# Patient Record
Sex: Female | Born: 1940
Health system: Southern US, Community
[De-identification: ages and names within clinical notes are randomized; demographics above are authoritative.]

## PROBLEM LIST (undated history)

## (undated) DIAGNOSIS — Z860100 Personal history of colon polyps, unspecified: Secondary | ICD-10-CM

## (undated) DIAGNOSIS — M545 Low back pain, unspecified: Secondary | ICD-10-CM

## (undated) DIAGNOSIS — I872 Venous insufficiency (chronic) (peripheral): Secondary | ICD-10-CM

## (undated) DIAGNOSIS — I341 Nonrheumatic mitral (valve) prolapse: Secondary | ICD-10-CM

## (undated) DIAGNOSIS — E7800 Pure hypercholesterolemia, unspecified: Secondary | ICD-10-CM

## (undated) DIAGNOSIS — F419 Anxiety disorder, unspecified: Secondary | ICD-10-CM

## (undated) DIAGNOSIS — E663 Overweight: Secondary | ICD-10-CM

## (undated) DIAGNOSIS — C189 Malignant neoplasm of colon, unspecified: Secondary | ICD-10-CM

## (undated) DIAGNOSIS — K5730 Diverticulosis of large intestine without perforation or abscess without bleeding: Secondary | ICD-10-CM

## (undated) DIAGNOSIS — I10 Essential (primary) hypertension: Secondary | ICD-10-CM

## (undated) DIAGNOSIS — E039 Hypothyroidism, unspecified: Secondary | ICD-10-CM

## (undated) DIAGNOSIS — Z8719 Personal history of other diseases of the digestive system: Secondary | ICD-10-CM

## (undated) HISTORY — DX: Diverticulosis of large intestine without perforation or abscess without bleeding: K57.30

## (undated) HISTORY — DX: Venous insufficiency (chronic) (peripheral): I87.2

## (undated) HISTORY — DX: Nonrheumatic mitral (valve) prolapse: I34.1

## (undated) HISTORY — DX: Low back pain, unspecified: M54.50

## (undated) HISTORY — DX: Pure hypercholesterolemia, unspecified: E78.00

## (undated) HISTORY — DX: Anxiety disorder, unspecified: F41.9

## (undated) HISTORY — DX: Overweight: E66.3

## (undated) HISTORY — DX: Essential (primary) hypertension: I10

## (undated) HISTORY — DX: Personal history of colon polyps, unspecified: Z86.0100

## (undated) HISTORY — DX: Low back pain: M54.5

## (undated) HISTORY — DX: Personal history of colonic polyps: Z86.010

## (undated) HISTORY — DX: Malignant neoplasm of colon, unspecified: C18.9

## (undated) HISTORY — DX: Personal history of other diseases of the digestive system: Z87.19

---

## 1972-09-18 HISTORY — PX: ABDOMINAL HYSTERECTOMY: SHX81

## 1986-09-18 HISTORY — PX: UMBILICAL HERNIA REPAIR: SHX196

## 2001-03-05 ENCOUNTER — Encounter: Payer: Self-pay | Admitting: Emergency Medicine

## 2001-03-05 ENCOUNTER — Emergency Department (HOSPITAL_COMMUNITY): Admission: EM | Admit: 2001-03-05 | Discharge: 2001-03-05 | Payer: Self-pay | Admitting: Emergency Medicine

## 2001-07-15 ENCOUNTER — Other Ambulatory Visit: Admission: RE | Admit: 2001-07-15 | Discharge: 2001-07-15 | Payer: Self-pay | Admitting: Obstetrics and Gynecology

## 2004-09-18 HISTORY — PX: OTHER SURGICAL HISTORY: SHX169

## 2006-05-25 ENCOUNTER — Ambulatory Visit: Payer: Self-pay | Admitting: Pulmonary Disease

## 2006-06-12 ENCOUNTER — Ambulatory Visit: Payer: Self-pay | Admitting: Pulmonary Disease

## 2006-11-19 ENCOUNTER — Ambulatory Visit: Payer: Self-pay | Admitting: Gastroenterology

## 2006-12-05 ENCOUNTER — Encounter (INDEPENDENT_AMBULATORY_CARE_PROVIDER_SITE_OTHER): Payer: Self-pay | Admitting: *Deleted

## 2006-12-05 ENCOUNTER — Ambulatory Visit: Payer: Self-pay | Admitting: Gastroenterology

## 2007-02-12 ENCOUNTER — Ambulatory Visit: Payer: Self-pay | Admitting: Gastroenterology

## 2007-02-27 ENCOUNTER — Ambulatory Visit: Payer: Self-pay | Admitting: Gastroenterology

## 2007-05-09 ENCOUNTER — Ambulatory Visit: Payer: Self-pay | Admitting: Pulmonary Disease

## 2007-05-09 LAB — CONVERTED CEMR LAB
ALT: 20 units/L (ref 0–35)
AST: 27 units/L (ref 0–37)
Albumin: 3.7 g/dL (ref 3.5–5.2)
Alkaline Phosphatase: 92 units/L (ref 39–117)
BUN: 13 mg/dL (ref 6–23)
Basophils Absolute: 0 10*3/uL (ref 0.0–0.1)
Basophils Relative: 0.5 % (ref 0.0–1.0)
Bilirubin, Direct: 0.1 mg/dL (ref 0.0–0.3)
CO2: 31 meq/L (ref 19–32)
Calcium: 9.3 mg/dL (ref 8.4–10.5)
Chloride: 104 meq/L (ref 96–112)
Cholesterol: 158 mg/dL (ref 0–200)
Creatinine, Ser: 0.9 mg/dL (ref 0.4–1.2)
Eosinophils Absolute: 0.3 10*3/uL (ref 0.0–0.6)
Eosinophils Relative: 4.6 % (ref 0.0–5.0)
GFR calc Af Amer: 81 mL/min
GFR calc non Af Amer: 67 mL/min
Glucose, Bld: 109 mg/dL — ABNORMAL HIGH (ref 70–99)
HCT: 37.1 % (ref 36.0–46.0)
HDL: 43 mg/dL (ref >39.0)
Hemoglobin: 12.6 g/dL (ref 12.0–15.0)
LDL Cholesterol: 102 mg/dL — ABNORMAL HIGH (ref 0–99)
Lymphocytes Relative: 24.8 % (ref 12.0–46.0)
MCHC: 33.9 g/dL (ref 30.0–36.0)
MCV: 86.5 fL (ref 78.0–100.0)
Monocytes Absolute: 0.6 10*3/uL (ref 0.2–0.7)
Monocytes Relative: 11.3 % — ABNORMAL HIGH (ref 3.0–11.0)
Neutro Abs: 3.2 10*3/uL (ref 1.4–7.7)
Neutrophils Relative %: 58.8 % (ref 43.0–77.0)
Platelets: 264 10*3/uL (ref 150–400)
Potassium: 3.5 meq/L (ref 3.5–5.1)
RBC: 4.28 M/uL (ref 3.87–5.11)
RDW: 13.3 % (ref 11.5–14.6)
Sodium: 141 meq/L (ref 135–145)
TSH: 2.97 microintl units/mL (ref 0.35–5.50)
Total Bilirubin: 1 mg/dL (ref 0.3–1.2)
Total CHOL/HDL Ratio: 3.7
Total Protein: 7.4 g/dL (ref 6.0–8.3)
Triglycerides: 64 mg/dL (ref 0–149)
VLDL: 13 mg/dL (ref 0–40)
WBC: 5.5 10*3/uL (ref 4.5–10.5)

## 2008-02-24 ENCOUNTER — Encounter: Payer: Self-pay | Admitting: Pulmonary Disease

## 2008-03-12 ENCOUNTER — Encounter: Payer: Self-pay | Admitting: Pulmonary Disease

## 2008-06-03 ENCOUNTER — Ambulatory Visit: Payer: Self-pay | Admitting: Pulmonary Disease

## 2008-06-03 DIAGNOSIS — D126 Benign neoplasm of colon, unspecified: Secondary | ICD-10-CM

## 2008-06-03 DIAGNOSIS — I10 Essential (primary) hypertension: Secondary | ICD-10-CM

## 2008-06-03 DIAGNOSIS — E7800 Pure hypercholesterolemia, unspecified: Secondary | ICD-10-CM

## 2008-06-03 DIAGNOSIS — E663 Overweight: Secondary | ICD-10-CM | POA: Insufficient documentation

## 2008-06-03 DIAGNOSIS — I119 Hypertensive heart disease without heart failure: Secondary | ICD-10-CM | POA: Insufficient documentation

## 2008-06-03 DIAGNOSIS — J309 Allergic rhinitis, unspecified: Secondary | ICD-10-CM | POA: Insufficient documentation

## 2008-06-03 DIAGNOSIS — I059 Rheumatic mitral valve disease, unspecified: Secondary | ICD-10-CM | POA: Insufficient documentation

## 2008-06-03 DIAGNOSIS — K649 Unspecified hemorrhoids: Secondary | ICD-10-CM | POA: Insufficient documentation

## 2008-06-03 DIAGNOSIS — F411 Generalized anxiety disorder: Secondary | ICD-10-CM | POA: Insufficient documentation

## 2008-06-07 DIAGNOSIS — C189 Malignant neoplasm of colon, unspecified: Secondary | ICD-10-CM | POA: Insufficient documentation

## 2008-06-07 DIAGNOSIS — K5730 Diverticulosis of large intestine without perforation or abscess without bleeding: Secondary | ICD-10-CM | POA: Insufficient documentation

## 2008-06-07 LAB — CONVERTED CEMR LAB
ALT: 27 units/L (ref 0–35)
AST: 29 units/L (ref 0–37)
Albumin: 4.1 g/dL (ref 3.5–5.2)
Alkaline Phosphatase: 76 units/L (ref 39–117)
BUN: 16 mg/dL (ref 6–23)
Basophils Absolute: 0.1 10*3/uL (ref 0.0–0.1)
Basophils Relative: 1.1 % (ref 0.0–3.0)
Bilirubin Urine: NEGATIVE
Bilirubin, Direct: 0.2 mg/dL (ref 0.0–0.3)
CO2: 30 meq/L (ref 19–32)
Calcium: 9.4 mg/dL (ref 8.4–10.5)
Chloride: 112 meq/L (ref 96–112)
Cholesterol: 151 mg/dL (ref 0–200)
Creatinine, Ser: 1 mg/dL (ref 0.4–1.2)
Crystals: NEGATIVE
Eosinophils Absolute: 0.2 10*3/uL (ref 0.0–0.7)
Eosinophils Relative: 3 % (ref 0.0–5.0)
GFR calc Af Amer: 71 mL/min
GFR calc non Af Amer: 59 mL/min
Glucose, Bld: 103 mg/dL — ABNORMAL HIGH (ref 70–99)
HCT: 39.1 % (ref 36.0–46.0)
HDL: 62.5 mg/dL (ref >39.0)
Hemoglobin, Urine: NEGATIVE
Hemoglobin: 13.1 g/dL (ref 12.0–15.0)
Ketones, ur: NEGATIVE mg/dL
LDL Cholesterol: 71 mg/dL (ref 0–99)
Lymphocytes Relative: 15.9 % (ref 12.0–46.0)
MCHC: 33.4 g/dL (ref 30.0–36.0)
MCV: 87.8 fL (ref 78.0–100.0)
Monocytes Absolute: 0.4 10*3/uL (ref 0.1–1.0)
Monocytes Relative: 5.3 % (ref 3.0–12.0)
Mucus, UA: NEGATIVE
Neutro Abs: 5.3 10*3/uL (ref 1.4–7.7)
Neutrophils Relative %: 72.5 % (ref 43.0–77.0)
Nitrite: NEGATIVE
Platelets: 245 10*3/uL (ref 150–400)
Potassium: 3.7 meq/L (ref 3.5–5.1)
RBC / HPF: NONE SEEN
RBC: 4.46 M/uL (ref 3.87–5.11)
RDW: 13.2 % (ref 11.5–14.6)
Sodium: 144 meq/L (ref 135–145)
Specific Gravity, Urine: 1.015 (ref 1.000–1.03)
TSH: 3.17 microintl units/mL (ref 0.35–5.50)
Total Bilirubin: 0.9 mg/dL (ref 0.3–1.2)
Total CHOL/HDL Ratio: 2.4
Total Protein, Urine: NEGATIVE mg/dL
Total Protein: 7.9 g/dL (ref 6.0–8.3)
Triglycerides: 88 mg/dL (ref 0–149)
Urine Glucose: NEGATIVE mg/dL
Urobilinogen, UA: 0.2 (ref 0.0–1.0)
VLDL: 18 mg/dL (ref 0–40)
Vit D, 1,25-Dihydroxy: 22 — ABNORMAL LOW (ref 30–89)
WBC: 7.3 10*3/uL (ref 4.5–10.5)
pH: 6 (ref 5.0–8.0)

## 2008-06-09 ENCOUNTER — Telehealth (INDEPENDENT_AMBULATORY_CARE_PROVIDER_SITE_OTHER): Payer: Self-pay | Admitting: *Deleted

## 2008-06-10 ENCOUNTER — Telehealth: Payer: Self-pay | Admitting: Pulmonary Disease

## 2009-01-13 ENCOUNTER — Encounter (INDEPENDENT_AMBULATORY_CARE_PROVIDER_SITE_OTHER): Payer: Self-pay | Admitting: *Deleted

## 2009-02-10 ENCOUNTER — Ambulatory Visit: Payer: Self-pay | Admitting: Gastroenterology

## 2009-02-24 ENCOUNTER — Ambulatory Visit: Payer: Self-pay | Admitting: Gastroenterology

## 2009-03-08 ENCOUNTER — Encounter: Payer: Self-pay | Admitting: Pulmonary Disease

## 2009-06-09 ENCOUNTER — Ambulatory Visit: Payer: Self-pay | Admitting: Pulmonary Disease

## 2009-06-09 DIAGNOSIS — E559 Vitamin D deficiency, unspecified: Secondary | ICD-10-CM

## 2009-06-12 DIAGNOSIS — I872 Venous insufficiency (chronic) (peripheral): Secondary | ICD-10-CM | POA: Insufficient documentation

## 2009-06-12 LAB — CONVERTED CEMR LAB
ALT: 19 units/L (ref 0–35)
AST: 25 units/L (ref 0–37)
Albumin: 3.8 g/dL (ref 3.5–5.2)
Alkaline Phosphatase: 77 units/L (ref 39–117)
BUN: 13 mg/dL (ref 6–23)
Basophils Absolute: 0 10*3/uL (ref 0.0–0.1)
Basophils Relative: 0.1 % (ref 0.0–3.0)
Bilirubin, Direct: 0.1 mg/dL (ref 0.0–0.3)
CO2: 30 meq/L (ref 19–32)
Calcium: 9.4 mg/dL (ref 8.4–10.5)
Chloride: 109 meq/L (ref 96–112)
Cholesterol: 149 mg/dL (ref 0–200)
Creatinine, Ser: 1 mg/dL (ref 0.4–1.2)
Eosinophils Absolute: 0.2 10*3/uL (ref 0.0–0.7)
Eosinophils Relative: 3.8 % (ref 0.0–5.0)
GFR calc non Af Amer: 71.01 mL/min (ref >60)
Glucose, Bld: 95 mg/dL (ref 70–99)
HCT: 38.3 % (ref 36.0–46.0)
HDL: 53.9 mg/dL (ref >39.00)
Hemoglobin: 12.6 g/dL (ref 12.0–15.0)
LDL Cholesterol: 82 mg/dL (ref 0–99)
Lymphocytes Relative: 17.9 % (ref 12.0–46.0)
Lymphs Abs: 1 10*3/uL (ref 0.7–4.0)
MCHC: 33 g/dL (ref 30.0–36.0)
MCV: 87.7 fL (ref 78.0–100.0)
Monocytes Absolute: 0.7 10*3/uL (ref 0.1–1.0)
Monocytes Relative: 13.1 % — ABNORMAL HIGH (ref 3.0–12.0)
Neutro Abs: 3.8 10*3/uL (ref 1.4–7.7)
Neutrophils Relative %: 65.1 % (ref 43.0–77.0)
Platelets: 230 10*3/uL (ref 150.0–400.0)
Potassium: 3.7 meq/L (ref 3.5–5.1)
RBC: 4.37 M/uL (ref 3.87–5.11)
RDW: 13.6 % (ref 11.5–14.6)
Sodium: 144 meq/L (ref 135–145)
TSH: 4.17 microintl units/mL (ref 0.35–5.50)
Total Bilirubin: 0.9 mg/dL (ref 0.3–1.2)
Total CHOL/HDL Ratio: 3
Total Protein: 7.6 g/dL (ref 6.0–8.3)
Triglycerides: 68 mg/dL (ref 0.0–149.0)
VLDL: 13.6 mg/dL (ref 0.0–40.0)
Vit D, 25-Hydroxy: 29 ng/mL — ABNORMAL LOW (ref 30–89)
WBC: 5.7 10*3/uL (ref 4.5–10.5)

## 2009-07-28 ENCOUNTER — Telehealth (INDEPENDENT_AMBULATORY_CARE_PROVIDER_SITE_OTHER): Payer: Self-pay | Admitting: *Deleted

## 2010-03-30 ENCOUNTER — Encounter: Payer: Self-pay | Admitting: Pulmonary Disease

## 2010-04-18 ENCOUNTER — Encounter: Payer: Self-pay | Admitting: Pulmonary Disease

## 2010-06-15 ENCOUNTER — Ambulatory Visit: Payer: Self-pay | Admitting: Pulmonary Disease

## 2010-06-19 DIAGNOSIS — M545 Low back pain: Secondary | ICD-10-CM

## 2010-06-19 LAB — CONVERTED CEMR LAB
ALT: 27 units/L (ref 0–35)
AST: 33 units/L (ref 0–37)
Albumin: 4 g/dL (ref 3.5–5.2)
Alkaline Phosphatase: 76 units/L (ref 39–117)
BUN: 15 mg/dL (ref 6–23)
Basophils Absolute: 0 10*3/uL (ref 0.0–0.1)
Basophils Relative: 0.5 % (ref 0.0–3.0)
Bilirubin, Direct: 0.1 mg/dL (ref 0.0–0.3)
CEA: 1.1 ng/mL (ref 0.0–5.0)
CO2: 30 meq/L (ref 19–32)
Calcium: 9.5 mg/dL (ref 8.4–10.5)
Chloride: 106 meq/L (ref 96–112)
Cholesterol: 157 mg/dL (ref 0–200)
Creatinine, Ser: 1.1 mg/dL (ref 0.4–1.2)
Eosinophils Absolute: 0.2 10*3/uL (ref 0.0–0.7)
Eosinophils Relative: 4.3 % (ref 0.0–5.0)
GFR calc non Af Amer: 66.19 mL/min (ref >60)
Glucose, Bld: 89 mg/dL (ref 70–99)
HCT: 37.9 % (ref 36.0–46.0)
HDL: 56.4 mg/dL (ref >39.00)
Hemoglobin: 12.9 g/dL (ref 12.0–15.0)
LDL Cholesterol: 90 mg/dL (ref 0–99)
Lymphocytes Relative: 18.6 % (ref 12.0–46.0)
Lymphs Abs: 1 10*3/uL (ref 0.7–4.0)
MCHC: 33.9 g/dL (ref 30.0–36.0)
MCV: 88.5 fL (ref 78.0–100.0)
Monocytes Absolute: 0.7 10*3/uL (ref 0.1–1.0)
Monocytes Relative: 14 % — ABNORMAL HIGH (ref 3.0–12.0)
Neutro Abs: 3.4 10*3/uL (ref 1.4–7.7)
Neutrophils Relative %: 62.6 % (ref 43.0–77.0)
Platelets: 223 10*3/uL (ref 150.0–400.0)
Potassium: 3.8 meq/L (ref 3.5–5.1)
RBC: 4.29 M/uL (ref 3.87–5.11)
RDW: 13.8 % (ref 11.5–14.6)
Sodium: 142 meq/L (ref 135–145)
TSH: 3.23 microintl units/mL (ref 0.35–5.50)
Total Bilirubin: 0.6 mg/dL (ref 0.3–1.2)
Total CHOL/HDL Ratio: 3
Total Protein: 7.3 g/dL (ref 6.0–8.3)
Triglycerides: 55 mg/dL (ref 0.0–149.0)
VLDL: 11 mg/dL (ref 0.0–40.0)
WBC: 5.4 10*3/uL (ref 4.5–10.5)

## 2010-06-29 ENCOUNTER — Encounter: Payer: Self-pay | Admitting: Pulmonary Disease

## 2010-06-29 ENCOUNTER — Ambulatory Visit: Payer: Self-pay | Admitting: Internal Medicine

## 2010-10-18 NOTE — Miscellaneous (Signed)
Summary: BONE DENSITY  Clinical Lists Changes  Orders: Added new Test order of T-Bone Densitometry (77080) - Signed Added new Test order of T-Lumbar Vertebral Assessment (77082) - Signed 

## 2010-10-18 NOTE — Assessment & Plan Note (Signed)
Summary: CPX/ MBW   CC:  Yearly ROV & review of mult medical problems....  History of Present Illness: 70 y/o BF here for a follow up visit... she has multiple medical problems as noted below...    ~  June 09, 2009:  she has had a good year- no new complaints or concerns... had Colonoscopy 6/10 by DrPatterson- no polyps or sign of cancer recurrence & f/u planned 77yrs.... had neg  Mammogram 6/10 as well...    ~  June 15, 2010:  Yearly ROV- doing satis, notes swelling in right supraclav area> exam reveals cyst vs lipoma & rec observ vs excision if she wants... BP sl elevated & we discussed adding Metoprolol; denies CP, palpit, SOB, edema;  stable on Cres20;  continues to have difficulty w/ wt reduction... apparently her [name], hasn't done BMD & we will proceed here...  she declines Flu shot.   Current Problem List:  ALLERGIC RHINITIS (ICD-477.9) - ex-smoker quit 1998... prev on allergy shots per DrESL and uses OTC antihistamines as needed... also states that BC's help her sinuses...  HYPERTENSION (ICD-401.9) - on MAXZIDE 1/2 daily + K20/d... BP= 132/90, takes med regularly and tolerates well... denies HA, fatigue, visual changes, CP, palipit, dizziness, syncope, dyspnea, edema, etc...  ~  9/11:  we decided to add METOPROLOL XL 50mg /d & she will renew efforts to lose wt.  MITRAL VALVE PROLAPSE (ICD-424.0) - on ASA 81mg /d... asymptomatic without CP, palpit, etc...  VENOUS INSUFFICIENCY (ICD-459.81) - she knows to avoid sodium, elevate legs, & wear support hose.  HYPERCHOLESTEROLEMIA (ICD-272.0) - on CRESTOR 20mg /d...   ~  FLP 8/08 showed TChol 158, TG 64, HDL 43, LDL 102  ~  FLP 9/09 showed TChol 151, TG 88, HDL 63, LDL 71  ~  FLP 9/10 showed TChol 149, TG 68, HDL 54, LDL 82  ~  FLP 9/11 showed TChol 157, TG 55, HDL 56, LDL 90  OVERWEIGHT (ICD-278.02) - discussed diet + exercise program designed to help pt lose wt...  ~  weight 9/09 = 208#  ~  weight 9/10 = 212#  ~   weight 9/11 = 207#  COLON CANCER (ICD-153.9) & COLONIC POLYPS (ICD-211.3) - prev colon 3/08 showed 25mm polyp, divertics, and hems... path= adenocarcinoma in a tubular adenoma, margins neg and being followed very carefully by DrPatterson...  ~  colonoscopy 6/08 by DrPatterson was normal...   ~  colonoscopy 6/10 by DrPatteron was neg- no polyp or lesions & f/u planned 66yrs.  DIVERTICULOSIS OF COLON (ICD-562.10) Hx of HEMORRHOIDS (ICD-455.6)  BACK PAIN, LUMBAR (ICD-724.2) - she blames this on her pendulous breasts & she has not pursued plastic surg approach... uses OTC Tylenol etc for discomfort...  VITAMIN D DEFICIENCY (ICD-268.9) - on Vit D 1000 u daily...  ~  labs 9/09 showed Vit D level= 22... started on Vit D 50000 u weekly but she stopped on her own.  ~  labs 9/10 showed Vit D level= 29... rec> Vit D 2000 u daily.  ANXIETY (ICD-300.00) - prev on Chlorazepate, but prefers AMBIEN 10mg - 1/2 Qhs Prn to help her rest...  DERM - she has mild Tinea Corporis (uses Lotrimin AF cream Prn) & Lipoma in right supraclav fossa...  Health Maintenance - GYN (DrKShelton) for PAPs, Mammograms, but no BMD yet... rec Calcium, MVI, VitD... she refuses Tetanus shot... given PNEUMOVAX 9/10- age 21, and she has declined the seasonal Flu vaccines...   Preventive Screening-Counseling & Management  Alcohol-Tobacco     Smoking Status:  quit     Packs/Day: .5     Year Quit: 1998  Allergies: 1)  ! Penicillin  Comments:  Nurse/Medical Assistant: The patient's medications and allergies were reviewed with the patient and were updated in the Medication and Allergy Lists.  Past History:  Past Medical History: ALLERGIC RHINITIS (ICD-477.9) HYPERTENSION (ICD-401.9) MITRAL VALVE PROLAPSE (ICD-424.0) VENOUS INSUFFICIENCY (ICD-459.81) HYPERCHOLESTEROLEMIA (ICD-272.0) OVERWEIGHT (ICD-278.02) COLON CANCER (ICD-153.9) COLONIC POLYPS (ICD-211.3) DIVERTICULOSIS OF COLON (ICD-562.10) Hx of HEMORRHOIDS  (ICD-455.6) BACK PAIN, LUMBAR (ICD-724.2) VITAMIN D DEFICIENCY (ICD-268.9) ANXIETY (ICD-300.00)  Past Surgical History: S/P hysterectomy in 1974 S/P umbilical hernia repair in 1988 S/P benign breast biopsy 2006  Family History: Reviewed history from 06/09/2009 and no changes required. mother deceased age 66 father is unsure of information 1 sibing alive age 21 1 sibling alive age 24  Social History: Reviewed history from 06/09/2009 and no changes required. ex smoker---quit in 1998 exposed to second hand smoke exercises 3 times per week no caffeine use widowed 2 children Packs/Day:  .5  Review of Systems       The patient complains of dyspnea on exertion.  The patient denies fever, chills, sweats, anorexia, fatigue, weakness, malaise, weight loss, sleep disorder, blurring, diplopia, eye irritation, eye discharge, vision loss, eye pain, photophobia, earache, ear discharge, tinnitus, decreased hearing, nasal congestion, nosebleeds, sore throat, hoarseness, chest pain, palpitations, syncope, orthopnea, PND, peripheral edema, cough, dyspnea at rest, excessive sputum, hemoptysis, wheezing, pleurisy, nausea, vomiting, diarrhea, constipation, change in bowel habits, abdominal pain, melena, hematochezia, jaundice, gas/bloating, indigestion/heartburn, dysphagia, odynophagia, dysuria, hematuria, urinary frequency, urinary hesitancy, nocturia, incontinence, back pain, joint pain, joint swelling, muscle cramps, muscle weakness, stiffness, arthritis, sciatica, restless legs, leg pain at night, leg pain with exertion, rash, itching, dryness, suspicious lesions, paralysis, paresthesias, seizures, tremors, vertigo, transient blindness, frequent falls, frequent headaches, difficulty walking, depression, anxiety, memory loss, confusion, cold intolerance, heat intolerance, polydipsia, polyphagia, polyuria, unusual weight change, abnormal bruising, bleeding, enlarged lymph nodes, urticaria, allergic rash,  hay fever, and recurrent infections.    Vital Signs:  Patient profile:   70 year old female Height:      67 inches Weight:      207.13 pounds BMI:     32.56 O2 Sat:      96 % on Room air Temp:     99.9 degrees F oral Pulse rate:   88 / minute BP sitting:   132 / 90  (right arm) Cuff size:   regular  Vitals Entered By: Randell Loop CMA (June 15, 2010 10:04 AM)  O2 Sat at Rest %:  96 O2 Flow:  Room air CC: Yearly ROV & review of mult medical problems... Is Patient Diabetic? No Pain Assessment Patient in pain? no      Comments meds updated today with pt   Physical Exam  Additional Exam:  WD, WN, 70 y/o WF in NAD... GENERAL:  Alert & oriented; pleasant & cooperative... HEENT:  [place]/AT, EOM-wnl, PERRLA, Fundi-benign, EACs-clear, TMs-wnl, NOSE-clear, THROAT-clear & wnl. NECK:  Supple w/ fairROM; no JVD; normal carotid impulses w/o bruits; no thyromegaly or nodules palpated; no lymphadenopathy. Lipoma vs cyst in right supraclavicular area... CHEST:  Clear to P & A; without wheezes/ rales/ or rhonchi heard... HEART:  Regular Rhythm; without murmurs/ rubs/ or gallops detected... ABDOMEN:  Soft & nontender; normal bowel sounds; no organomegaly or masses palpated... EXT: without deformities or arthritic changes; no varicose veins/ venous insuffic/ or edema. NEURO:  CN's intact; motor testing normal; sensory testing normal; gait normal &  balance OK. DERM:  No lesions noted; no rash etc...    CXR  Procedure date:  06/15/2010  Findings:      CHEST - 2 VIEW Comparison: The 05/25/2006   Findings: Heart is upper limits normal in size.  Lungs are clear. No effusions or acute bony abnormality.   IMPRESSION: No active disease.   Read By:  Charlett Nose,  M.D.   MISC. Report  Procedure date:  06/15/2010  Findings:      BMP (METABOL)   Sodium                    142 mEq/L                   135-145   Potassium                 3.8 mEq/L                   3.5-5.1    Chloride                  106 mEq/L                   96-112   Carbon Dioxide            30 mEq/L                    19-32   Glucose                   89 mg/dL                    37-62   BUN                       15 mg/dL                    8-31   Creatinine                1.1 mg/dL                   5.1-7.6   Calcium                   9.5 mg/dL                   1.6-07.3   GFR                       66.19 mL/min                >60  Hepatic/Liver Function Panel (HEPATIC)   Total Bilirubin           0.6 mg/dL                   7.1-0.6   Direct Bilirubin          0.1 mg/dL                   2.6-9.4   Alkaline Phosphatase      76 U/L                      39-117   AST                       33 U/L  0-37   ALT                       27 U/L                      0-35   Total Protein             7.3 g/dL                    1.6-1.0   Albumin                   4.0 g/dL                    9.6-0.4  CBC Platelet w/Diff (CBCD)   White Cell Count          5.4 K/uL                    4.5-10.5   Red Cell Count            4.29 Mil/uL                 3.87-5.11   Hemoglobin                12.9 g/dL                   54.0-98.1   Hematocrit                37.9 %                      36.0-46.0   MCV                       88.5 fl                     78.0-100.0   Platelet Count            223.0 K/uL                  150.0-400.0   Neutrophil %              62.6 %                      43.0-77.0   Lymphocyte %              18.6 %                      12.0-46.0   Monocyte %           [H]  14.0 %                      3.0-12.0   Eosinophils%              4.3 %                       0.0-5.0   Basophils %               0.5 %                       0.0-3.0  Comments:      Lipid Panel (LIPID)   Cholesterol  157 mg/dL                   4-010   Triglycerides             55.0 mg/dL                  2.7-253.6   HDL                       64.40 mg/dL                 >34.74   LDL Cholesterol            90 mg/dL                    2-59           TSH (TSH)   FastTSH                   3.23 uIU/mL                 0.35-5.50  CEA (CEA)   CEA                       1.1 ng/mL                   0.0-5.0   Impression & Recommendations:  Problem # 1:  HYPERTENSION (ICD-401.9) We decided to add Metoprolol to the Maxzide... monitor BP at hme and call if not responding. Her updated medication list for this problem includes:    Metoprolol Succinate 50 Mg Xr24h-tab (Metoprolol succinate) .Marland Kitchen... Take 1 tab by mouth once daily...    Maxzide 75-50 Mg Tabs (Triamterene-hctz) .Marland Kitchen... Take 1/2 tablet by mouth daily  Orders: T-2 View CXR (71020TC) TLB-BMP (Basic Metabolic Panel-BMET) (80048-METABOL) TLB-Hepatic/Liver Function Pnl (80076-HEPATIC) TLB-CBC Platelet - w/Differential (85025-CBCD) TLB-Lipid Panel (80061-LIPID) TLB-TSH (Thyroid Stimulating Hormone) (84443-TSH)  Problem # 2:  HYPERCHOLESTEROLEMIA (ICD-272.0) Stable on the Crestor 20mg /d... Her updated medication list for this problem includes:    Crestor 20 Mg Tabs (Rosuvastatin calcium) .Marland Kitchen... Take 1 tab by mouth at bedtime...  Problem # 3:  OVERWEIGHT (ICD-278.02) We discussed diet + exercise & the need to decr weight...  Problem # 4:  COLON CANCER (ICD-153.9) Stable w/ last colon 6/10 & f/u planned 5 yrs...  Problem # 5:  VITAMIN D DEFICIENCY (ICD-268.9) She will ret for baseline BMD since it hasn't been done by her GYN... continue Calcium, MVI, Vit D...  Problem # 6:  OTHER MEDICAL PROBLEMS AS NOTED>>>  Complete Medication List: 1)  Adult Aspirin Ec Low Strength 81 Mg Tbec (Aspirin) .... Once daily 2)  Metoprolol Succinate 50 Mg Xr24h-tab (Metoprolol succinate) .... Take 1 tab by mouth once daily.Marland KitchenMarland Kitchen 3)  Maxzide 75-50 Mg Tabs (Triamterene-hctz) .... Take 1/2 tablet by mouth daily 4)  Klor-con M20 20 Meq Tbcr (Potassium chloride crys cr) .... Take 1 tablet by mouth once a day 5)  Crestor 20 Mg Tabs (Rosuvastatin calcium) ....  Take 1 tab by mouth at bedtime.Marland KitchenMarland Kitchen 6)  Ambien 10 Mg Tabs (Zolpidem tartrate) .... Take1/2 to 1 tab by mouth at bedtime as needed for sleep.Marland KitchenMarland Kitchen 7)  Womens Multivitamin Plus Tabs (Multiple vitamins-minerals) .... Take 1 tab by mouth once daily.Marland KitchenMarland Kitchen 8)  Vitamin D 2000 Unit Tabs (Cholecalciferol) .... Take 1 tablet by mouth once a day 9)  Lotrimin Af 1 % Crea (Clotrimazole) .... Apply to rash two  times a day...  Other Orders: TLB-CEA (Carcinoembryonic Antigen) (82378-CEA)  Patient Instructions: 1)  Today we updated your med list- see below.... 2)  We wrote a new perscription for METOPROLOL to take in addition to your Maxzide for your BP & heart... 3)  Today we did your follow up CXR & FASTING blood work...  please call the "phone tree" in a few days for your lab results.Marland KitchenMarland Kitchen 4)  Keep up the good work w/ diet + exercise... the goal is to lose 10-15 lbs over the next 6 months... 5)  Call for any questions.Marland KitchenMarland Kitchen 6)  Please schedule a follow-up appointment in 6 months. Prescriptions: LOTRIMIN AF 1 % CREA (CLOTRIMAZOLE) apply to rash two times a day...  #1 tube x 12   Entered and Authorized by:   Michele Mcalpine MD   Signed by:   Michele Mcalpine MD on 06/15/2010   Method used:   Print then Give to Patient   RxID:   [id number] AMBIEN 10 MG TABS (ZOLPIDEM TARTRATE) take1/2 to 1 tab by mouth at bedtime as needed for sleep...  #30 x 12   Entered and Authorized by:   Michele Mcalpine MD   Signed by:   Michele Mcalpine MD on 06/15/2010   Method used:   Print then Give to Patient   RxID:   [id number] CRESTOR 20 MG  TABS (ROSUVASTATIN CALCIUM) take 1 tab by mouth at bedtime...  #30 x 12   Entered and Authorized by:   Michele Mcalpine MD   Signed by:   Michele Mcalpine MD on 06/15/2010   Method used:   Print then Give to Patient   RxID:   [id number] KLOR-CON M20 20 MEQ  TBCR (POTASSIUM CHLORIDE CRYS CR) Take 1 tablet by mouth once a day  #30 x 12   Entered and Authorized by:   Michele Mcalpine MD   Signed by:    Michele Mcalpine MD on 06/15/2010   Method used:   Print then Give to Patient   RxID:   [id number] MAXZIDE 75-50 MG TABS (TRIAMTERENE-HCTZ) take 1/2 tablet by mouth daily  #30 x 12   Entered and Authorized by:   Michele Mcalpine MD   Signed by:   Michele Mcalpine MD on 06/15/2010   Method used:   Print then Give to Patient   RxID:   [id number] METOPROLOL SUCCINATE 50 MG XR24H-TAB (METOPROLOL SUCCINATE) take 1 tab by mouth once daily...  #30 x 12   Entered and Authorized by:   Michele Mcalpine MD   Signed by:   Michele Mcalpine MD on 06/15/2010   Method used:   Print then Give to Patient   RxID:   [id number]    Immunization History:  Influenza Immunization History:    Influenza:  historical (07/08/2009)

## 2010-11-23 ENCOUNTER — Encounter: Payer: Self-pay | Admitting: Adult Health

## 2010-11-23 ENCOUNTER — Telehealth: Payer: Self-pay | Admitting: Pulmonary Disease

## 2010-11-23 ENCOUNTER — Ambulatory Visit (INDEPENDENT_AMBULATORY_CARE_PROVIDER_SITE_OTHER): Payer: Medicare Other | Admitting: Adult Health

## 2010-11-23 DIAGNOSIS — I10 Essential (primary) hypertension: Secondary | ICD-10-CM

## 2010-11-29 NOTE — Assessment & Plan Note (Signed)
Summary: OV TO CHECK BP//SH   Visit Type:  Acute NP BP visit Primary Provider/Referring Provider:  Alroy Dust, MD  CC:  Pt c/o BP readings 147/81, 125/75, 123/71 this AM with the [facility] automatic machine. Pt wants to know what machine is the best for her.  C/o headaches Friday BP-150/106, and Sunday-150/100.  History of Present Illness: 70 yo female with known hx of HTN and Hyperlipidemia  11/23/10--Presents for work in visit for elevated blood pressure. 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 Pt c/o BP readings 147/81, 125/75,  123/71 this AM with the [facility] automatic machine. Pt wants to know what machine is the best for her.  C/o headaches Friday BP-150/106, Sunday-150/100 on [facility]. We checked blood pressue today and she is  ~118. systolic. She admits she has been eating very salty foods and stopped last few days with better readings.  Headache resolved with drinking water and eating better. No visual speech changes. no extremity weakness. Denies chest pain, dyspnea, orthopnea, hemoptysis, fever, n/v/d, edema, headache. We discussed getting automatic b/p cuffs-large cuff for her.     Preventive Screening-Counseling & Management  Alcohol-Tobacco     Smoking Status: quit     Packs/Day: .5     Year Started: 1962     Year Quit: 1998  Medications Prior to Update: 1)  Adult Aspirin Ec Low Strength 81 Mg  Tbec (Aspirin) .... Once Daily 2)  Metoprolol Succinate 50 Mg Xr24h-Tab (Metoprolol Succinate) .... Take 1 Tab By Mouth Once Daily.Marland KitchenMarland Kitchen 3)  Maxzide 75-50 Mg Tabs (Triamterene-Hctz) .... Take 1/2 Tablet By Mouth Daily 4)  Klor-Con M20 20 Meq  Tbcr (Potassium Chloride Crys Cr) .... Take 1 Tablet By Mouth Once A Day 5)  Crestor 20 Mg  Tabs (Rosuvastatin Calcium) .... Take 1 Tab By Mouth At Bedtime.Marland KitchenMarland Kitchen 6)  Ambien 10 Mg Tabs (Zolpidem Tartrate) .... Take1/2 To 1 Tab By Mouth At Bedtime As Needed For Sleep.Marland KitchenMarland Kitchen 7)  Womens Multivitamin Plus  Tabs (Multiple Vitamins-Minerals) .... Take 1 Tab By  Mouth Once Daily.Marland KitchenMarland Kitchen 8)  Vitamin D 2000 Unit Tabs (Cholecalciferol) .... Take 1 Tablet By Mouth Once A Day 9)  Lotrimin Af 1 % Crea (Clotrimazole) .... Apply To Rash Two Times A Day...  Current Medications (verified): 1)  Adult Aspirin Ec Low Strength 81 Mg  Tbec (Aspirin) .... Once Daily 2)  Metoprolol Succinate 50 Mg Xr24h-Tab (Metoprolol Succinate) .... Take 1 Tab By Mouth Once Daily.Marland KitchenMarland Kitchen 3)  Maxzide 75-50 Mg Tabs (Triamterene-Hctz) .... Take 1/2 Tablet By Mouth Daily 4)  Klor-Con M20 20 Meq  Tbcr (Potassium Chloride Crys Cr) .... Take 1 Tablet By Mouth Once A Day 5)  Crestor 20 Mg  Tabs (Rosuvastatin Calcium) .... Take 1 Tab By Mouth At Bedtime.Marland KitchenMarland Kitchen 6)  Ambien 10 Mg Tabs (Zolpidem Tartrate) .... Take1/2 To 1 Tab By Mouth At Bedtime As Needed For Sleep.Marland KitchenMarland Kitchen 7)  Womens Multivitamin Plus  Tabs (Multiple Vitamins-Minerals) .... Take 1 Tab By Mouth Once Daily.Marland KitchenMarland Kitchen 8)  Vitamin D 2000 Unit Tabs (Cholecalciferol) .... Take 1 Tablet By Mouth Once A Day 9)  Bc Fast Pain Relief 845-65 Mg Pack (Aspirin-Caffeine) .... As Needed 10)  Allergy Shot .... As Directed  Allergies (verified): 1)  ! Penicillin  Past History:  Past Medical History: Last updated: 06/15/2010 ALLERGIC RHINITIS (ICD-477.9) HYPERTENSION (ICD-401.9) MITRAL VALVE PROLAPSE (ICD-424.0) VENOUS INSUFFICIENCY (ICD-459.81) HYPERCHOLESTEROLEMIA (ICD-272.0) OVERWEIGHT (ICD-278.02) COLON CANCER (ICD-153.9) COLONIC POLYPS (ICD-211.3) DIVERTICULOSIS OF COLON (ICD-562.10) Hx of HEMORRHOIDS (ICD-455.6) BACK PAIN, LUMBAR (ICD-724.2) VITAMIN  D DEFICIENCY (ICD-268.9) ANXIETY (ICD-300.00)  Past Surgical History: Last updated: Jun 16, 2010 S/P hysterectomy in 1974 S/P umbilical hernia repair in 1988 S/P benign breast biopsy 2006  Family History: Last updated: 06-16-10 mother deceased age 65 father is unsure of information 1 sibing alive age 69 1 sibling alive age 34  Social History: Last updated: 06/09/2009 ex smoker---quit in  1998 exposed to second hand smoke exercises 3 times per week no caffeine use widowed 2 children  Risk Factors: Smoking Status: quit (11/23/2010) Packs/Day: .5 (11/23/2010)  Vital Signs:  Patient profile:   70 year old female Height:      67 inches Weight:      204.8 pounds BMI:     32.19 O2 Sat:      98 % on Room air Temp:     98.8 degrees F oral Pulse rate:   67 / minute BP sitting:   118 / 82  (left arm) Cuff size:   large  Vitals Entered By: Zackery Barefoot CMA (November 23, 2010 9:47 AM)  O2 Flow:  Room air CC: Pt c/o BP readings 147/81, 125/75,  123/71 this AM with the [facility] machine. Pt wants to know what machine is the best for her.  C/o headaches Friday BP-150/106, Sunday-150/100 Is Patient Diabetic? No Comments Medications reviewed with patient Verified contact number and pharmacy with patient Zackery Barefoot CMA  November 23, 2010 9:48 AM    Physical Exam  Additional Exam:  WD, WN, 70 y/o WF in NAD... GENERAL:  Alert & oriented; pleasant & cooperative... HEENT:  [place]/AT, EOM-wnl, PERRLA, Fundi-benign, EACs-clear, TMs-wnl, NOSE-clear, THROAT-clear & wnl. NECK:  Supple w/ fairROM; no JVD; normal carotid impulses w/o bruits; no thyromegaly or nodules palpated; no lymphadenopathy. CHEST:  Clear to P & A; without wheezes/ rales/ or rhonchi heard... HEART:  Regular Rhythm; without murmurs/ rubs/ or gallops detected... ABDOMEN:  Soft & nontender; normal bowel sounds; no organomegaly or masses palpated... EXT: without deformities or arthritic changes; no varicose veins/ venous insuffic/ or edema. NEURO:  motor testing normal; sensory testing normal; gait normal & balance OK. DERM:  No lesions noted; no rash etc...    Impression & Recommendations:  Problem # 1:  HYPERTENSION (ICD-401.9)   contorllled on rx  advised on healthy choices plan:    Low salt diet  Continue on same meds  Exercise as tolerated Weight loss follow up Dr. Kriste Basque in 2 months and as  needed  check blood pressure 3 x weeks with large cuff, keep log  Her updated medication list for this problem includes:    Metoprolol Succinate 50 Mg Xr24h-tab (Metoprolol succinate) .Marland Kitchen... Take 1 tab by mouth once daily...    Maxzide 75-50 Mg Tabs (Triamterene-hctz) .Marland Kitchen... Take 1/2 tablet by mouth daily  BP today: 118/82 Prior BP: 132/90 (06-16-2010)  Labs Reviewed: K+: 3.8 (June 16, 2010) Creat: : 1.1 (06-16-10)   Chol: 157 (2010/06/16)   HDL: 56.40 (06/16/10)   LDL: 90 (Jun 16, 2010)   TG: 55.0 (06/16/2010)  Orders: Est. Patient Level III (16109)  Medications Added to Medication List This Visit: 1)  Bc Fast Pain Relief 845-65 Mg Pack (Aspirin-caffeine) .... As needed 2)  Allergy Shot  .... As directed  Patient Instructions: 1)  Your blood pressure was 118/82 today  2)  Low salt diet  3)  Continue on same meds  4)  Exercise as tolerated 5)  Weight loss 6)  follow up Dr. Kriste Basque in 2 months and as needed  7)  check blood  pressure 3 x weeks with large cuff, keep log

## 2010-11-29 NOTE — Progress Notes (Signed)
Summary: Patient is in the lobby would like her BP checked it was elevate  Phone Note Call from Patient   Caller: Patient Call For: Markice Torbert Summary of Call: [facility] 125/77 and she came her to have it checked since Friday it has been running from 150/106 and 150/100 147/81. She is in the lobby waiting Initial call taken by: Vedia Coffer,  November 23, 2010 9:20 AM  Follow-up for Phone Call        Pt scheduled to see TP this am. Zackery Barefoot CMA  November 23, 2010 9:44 AM

## 2011-01-16 ENCOUNTER — Other Ambulatory Visit: Payer: Self-pay | Admitting: Pulmonary Disease

## 2011-01-17 ENCOUNTER — Telehealth: Payer: Self-pay | Admitting: Pulmonary Disease

## 2011-01-17 NOTE — Telephone Encounter (Signed)
Called and spoke with pt and she is aware of Remus Loffler that has been called to the pharmacy and pt has appt with SN on monday

## 2011-01-17 NOTE — Telephone Encounter (Signed)
Spoke w/ pt and she is needing a refill on her [place]. Pt was last seen 11/23/10 by TP and has an upcoming apt 5/7 at 2:30. Pt wants this called in today. Please advise Dr. Kriste Basque. Thanks  Carver Fila, CMA

## 2011-01-23 ENCOUNTER — Encounter: Payer: Self-pay | Admitting: Pulmonary Disease

## 2011-01-23 ENCOUNTER — Ambulatory Visit (INDEPENDENT_AMBULATORY_CARE_PROVIDER_SITE_OTHER): Payer: Medicare Other | Admitting: Pulmonary Disease

## 2011-01-23 DIAGNOSIS — E7800 Pure hypercholesterolemia, unspecified: Secondary | ICD-10-CM

## 2011-01-23 DIAGNOSIS — I872 Venous insufficiency (chronic) (peripheral): Secondary | ICD-10-CM

## 2011-01-23 DIAGNOSIS — E559 Vitamin D deficiency, unspecified: Secondary | ICD-10-CM

## 2011-01-23 DIAGNOSIS — I10 Essential (primary) hypertension: Secondary | ICD-10-CM

## 2011-01-23 DIAGNOSIS — C189 Malignant neoplasm of colon, unspecified: Secondary | ICD-10-CM

## 2011-01-23 DIAGNOSIS — I059 Rheumatic mitral valve disease, unspecified: Secondary | ICD-10-CM

## 2011-01-23 DIAGNOSIS — F411 Generalized anxiety disorder: Secondary | ICD-10-CM

## 2011-01-23 DIAGNOSIS — K5730 Diverticulosis of large intestine without perforation or abscess without bleeding: Secondary | ICD-10-CM

## 2011-01-23 NOTE — Progress Notes (Signed)
Subjective:    Patient ID: Janet Barber, female    DOB: [birth year], 70 y.o.   MRN: [id number]  HPI 70 y/o BF here for a follow up visit... she has multiple medical problems as noted below...   ~  June 15, 2010:  Yearly ROV- doing satis, notes swelling in right supraclav area> exam reveals cyst vs lipoma & rec observ vs excision if she wants... BP sl elevated & we discussed adding Metoprolol; denies CP, palpit, SOB, edema;  stable on Cres20;  continues to have difficulty w/ wt reduction... apparently her [name], hasn't done BMD & we will proceed here...  she declines Flu shot.  ~  Jan 23, 2011:  22mo ROV & doing satis on the Metoprolol + Maxzide- see prob list below;  No new complaints or concerns;  We reviewed labs from 9/11...        Problem List:  ALLERGIC RHINITIS (ICD-477.9) - ex-smoker quit 1998... prev on allergy shots per DrESL and uses OTC antihistamines as needed... also states that BC's help her sinuses...  HYPERTENSION (ICD-401.9) - on TOPROL XL 50mg /d & MAXZIDE 1/2 daily + K20/d... ~  9/11:  we decided to add METOPROLOL XL 50mg /d & she will renew efforts to lose wt. ~  5/12:  BP= 140/70, takes med regularly and tolerates well... denies HA, fatigue, visual changes, CP, palipit, dizziness, syncope, dyspnea, edema, etc...  MITRAL VALVE PROLAPSE (ICD-424.0) - on ASA 81mg /d... asymptomatic without CP, palpit, etc...  VENOUS INSUFFICIENCY (ICD-459.81) - she knows to avoid sodium, elevate legs, & wear support hose.  HYPERCHOLESTEROLEMIA (ICD-272.0) - on CRESTOR 20mg /d...  ~  FLP 8/08 showed TChol 158, TG 64, HDL 43, LDL 102 ~  FLP 9/09 showed TChol 151, TG 88, HDL 63, LDL 71 ~  FLP 9/10 showed TChol 149, TG 68, HDL 54, LDL 82 ~  FLP 9/11 showed TChol 157, TG 55, HDL 56, LDL 90  OVERWEIGHT (ICD-278.02) - discussed diet + exercise program designed to help pt lose wt... ~  weight 9/09 = 208# ~  weight 9/10 = 212# ~  weight 9/11 = 207# ~  Weight 5/12 = 206#  COLON  CANCER (ICD-153.9) & COLONIC POLYPS (ICD-211.3) - prev colon 3/08 showed 25mm polyp, divertics, and hems... path= adenocarcinoma in a tubular adenoma, margins neg and being followed very carefully by DrPatterson... ~  colonoscopy 6/08 by DrPatterson was normal...  ~  colonoscopy 6/10 by DrPatteron was neg- no polyp or lesions & f/u planned 49yrs.  DIVERTICULOSIS OF COLON (ICD-562.10) Hx of HEMORRHOIDS (ICD-455.6)  BACK PAIN, LUMBAR (ICD-724.2) - she blames this on her pendulous breasts & she has not pursued plastic surg approach... uses OTC Tylenol etc for discomfort...  VITAMIN D DEFICIENCY (ICD-268.9) - on Vit D 1000 u daily... ~  labs 9/09 showed Vit D level= 22... started on Vit D 50000 u weekly but she stopped on her own. ~  labs 9/10 showed Vit D level= 29... rec> Vit D 2000 u daily.  ANXIETY (ICD-300.00) - prev on Chlorazepate, but prefers AMBIEN 10mg - 1/2 Qhs Prn to help her rest...  DERM - she has mild Tinea Corporis (uses Lotrimin AF cream Prn) & Lipoma in right supraclav fossa...  Health Maintenance - GYN (DrKShelton) for PAPs, Mammograms, but no BMD yet... rec Calcium, MVI, VitD... she refuses Tetanus shot... given PNEUMOVAX 9/10- age 31, and she has declined the seasonal Flu vaccines...   Past Surgical History  Procedure Date  . Abdominal hysterectomy 1974  . Umbilical  hernia repair 1988  . Benign breast biopsy 2006    Outpatient Encounter Prescriptions as of 01/23/2011  Medication Sig Dispense Refill  . aspirin 81 MG tablet Take 81 mg by mouth daily.        . Aspirin-Caffeine (BC FAST PAIN RELIEF) 845-65 MG PACK As needed       . Cholecalciferol (VITAMIN D) 2000 UNITS CAPS Take 1 capsule by mouth daily.        . metoprolol (TOPROL-XL) 50 MG 24 hr tablet Take 50 mg by mouth daily.        . Multiple Vitamins-Minerals (WOMENS MULTIVITAMIN PLUS PO) Take 1 tablet by mouth daily.        . potassium chloride SA (KLOR-CON M20) 20 MEQ tablet Take 20 mEq by mouth daily.        .  rosuvastatin (CRESTOR) 20 MG tablet Take 20 mg by mouth at bedtime.        . triamterene-hydrochlorothiazide (MAXZIDE) 75-50 MG per tablet Take 1/2 tablet by mouth daily       .  zolpidem (AMBIEN) 10 MG tablet take 1/2 to 1 tablet by mouth at bedtime if needed for sleep  30 tablet  5    Allergies  Allergen Reactions  . Penicillins     REACTION: unsure of reaction---years ago    Review of Systems       The patient complains of dyspnea on exertion.  The patient denies fever, chills, sweats, anorexia, fatigue, weakness, malaise, weight loss, sleep disorder, blurring, diplopia, eye irritation, eye discharge, vision loss, eye pain, photophobia, earache, ear discharge, tinnitus, decreased hearing, nasal congestion, nosebleeds, sore throat, hoarseness, chest pain, palpitations, syncope, orthopnea, PND, peripheral edema, cough, dyspnea at rest, excessive sputum, hemoptysis, wheezing, pleurisy, nausea, vomiting, diarrhea, constipation, change in bowel habits, abdominal pain, melena, hematochezia, jaundice, gas/bloating, indigestion/heartburn, dysphagia, odynophagia, dysuria, hematuria, urinary frequency, urinary hesitancy, nocturia, incontinence, back pain, joint pain, joint swelling, muscle cramps, muscle weakness, stiffness, arthritis, sciatica, restless legs, leg pain at night, leg pain with exertion, rash, itching, dryness, suspicious lesions, paralysis, paresthesias, seizures, tremors, vertigo, transient blindness, frequent falls, frequent headaches, difficulty walking, depression, anxiety, memory loss, confusion, cold intolerance, heat intolerance, polydipsia, polyphagia, polyuria, unusual weight change, abnormal bruising, bleeding, enlarged lymph nodes, urticaria, allergic rash, hay fever, and recurrent infection...   Objective:   Physical Exam      WD, WN, 70 y/o WF in NAD... GENERAL:  Alert & oriented; pleasant & cooperative... HEENT:  [place]/AT, EOM-wnl, PERRLA, Fundi-benign, EACs-clear, TMs-wnl,  NOSE-clear, THROAT-clear & wnl. NECK:  Supple w/ fairROM; no JVD; normal carotid impulses w/o bruits; no thyromegaly or nodules palpated; no lymphadenopathy. Lipoma vs cyst in right supraclavicular area... CHEST:  Clear to P & A; without wheezes/ rales/ or rhonchi heard... HEART:  Regular Rhythm; without murmurs/ rubs/ or gallops detected... ABDOMEN:  Soft & nontender; normal bowel sounds; no organomegaly or masses palpated... EXT: without deformities or arthritic changes; no varicose veins/ venous insuffic/ or edema. NEURO:  CN's intact; motor testing normal; sensory testing normal; gait normal & balance OK. DERM:  No lesions noted; no rash etc...   Assessment & Plan:   HBP>  Better controlled on the BBlocker & Diuretic, continue same- she thinks she'd like to try the Metoprolol 1/2 Bid rather than all at once- OK.  MVP>  She denies CP, palpit, dizzy, syncope, edema, etc; Metoprolol helps...  CHOL>  Stable on diet + Cres20, continue same + better diet, get wt down!  OVERWEIGHT>  We  reviewed diet + exercise program needed to lose weight...  GI>  Stable & up to date...  Vit D defic>  On 2000 u daily, note BMD was WNL...  ANXIETY>  She seems sl "hypomanic" today but just relates a lot going on, uses ambien Qhs for sleep.Marland KitchenMarland Kitchen

## 2011-01-23 NOTE — Patient Instructions (Signed)
Today we updated your med list in our EPIC system...    Continue your current meds the same...  We reviewed your prev lab data... Let's get on track w/ our diet & exercise program... Call for any problems... Let's plan a follow up eval w/ FASTING blood work in about 6 months.Marland KitchenMarland Kitchen

## 2011-04-13 ENCOUNTER — Encounter: Payer: Self-pay | Admitting: Pulmonary Disease

## 2011-06-20 ENCOUNTER — Other Ambulatory Visit: Payer: Self-pay | Admitting: Pulmonary Disease

## 2011-06-21 ENCOUNTER — Other Ambulatory Visit (INDEPENDENT_AMBULATORY_CARE_PROVIDER_SITE_OTHER): Payer: Medicare Other

## 2011-06-21 ENCOUNTER — Encounter: Payer: Self-pay | Admitting: Pulmonary Disease

## 2011-06-21 ENCOUNTER — Ambulatory Visit (INDEPENDENT_AMBULATORY_CARE_PROVIDER_SITE_OTHER): Payer: Medicare Other | Admitting: Pulmonary Disease

## 2011-06-21 VITALS — BP 130/84 | HR 84 | Temp 98.8°F | Ht 67.0 in | Wt 205.2 lb

## 2011-06-21 DIAGNOSIS — K5730 Diverticulosis of large intestine without perforation or abscess without bleeding: Secondary | ICD-10-CM

## 2011-06-21 DIAGNOSIS — F411 Generalized anxiety disorder: Secondary | ICD-10-CM

## 2011-06-21 DIAGNOSIS — M545 Low back pain, unspecified: Secondary | ICD-10-CM

## 2011-06-21 DIAGNOSIS — E663 Overweight: Secondary | ICD-10-CM

## 2011-06-21 DIAGNOSIS — I10 Essential (primary) hypertension: Secondary | ICD-10-CM

## 2011-06-21 DIAGNOSIS — C189 Malignant neoplasm of colon, unspecified: Secondary | ICD-10-CM

## 2011-06-21 DIAGNOSIS — J209 Acute bronchitis, unspecified: Secondary | ICD-10-CM

## 2011-06-21 DIAGNOSIS — E7800 Pure hypercholesterolemia, unspecified: Secondary | ICD-10-CM

## 2011-06-21 DIAGNOSIS — E559 Vitamin D deficiency, unspecified: Secondary | ICD-10-CM

## 2011-06-21 DIAGNOSIS — I059 Rheumatic mitral valve disease, unspecified: Secondary | ICD-10-CM

## 2011-06-21 DIAGNOSIS — I872 Venous insufficiency (chronic) (peripheral): Secondary | ICD-10-CM

## 2011-06-21 LAB — HEPATIC FUNCTION PANEL
ALT: 22 U/L (ref 0–35)
Alkaline Phosphatase: 78 U/L (ref 39–117)
Bilirubin, Direct: 0.1 mg/dL (ref 0.0–0.3)
Total Bilirubin: 0.8 mg/dL (ref 0.3–1.2)
Total Protein: 7.5 g/dL (ref 6.0–8.3)

## 2011-06-21 LAB — CBC WITH DIFFERENTIAL/PLATELET
Basophils Absolute: 0 10*3/uL (ref 0.0–0.1)
Basophils Relative: 0.4 % (ref 0.0–3.0)
Eosinophils Absolute: 0.3 10*3/uL (ref 0.0–0.7)
Hemoglobin: 12.9 g/dL (ref 12.0–15.0)
Lymphocytes Relative: 16.3 % (ref 12.0–46.0)
MCHC: 32.9 g/dL (ref 30.0–36.0)
MCV: 89.9 fl (ref 78.0–100.0)
Monocytes Absolute: 0.7 10*3/uL (ref 0.1–1.0)
Neutro Abs: 4.9 10*3/uL (ref 1.4–7.7)
RBC: 4.34 Mil/uL (ref 3.87–5.11)
RDW: 13.7 % (ref 11.5–14.6)

## 2011-06-21 LAB — BASIC METABOLIC PANEL
CO2: 29 mEq/L (ref 19–32)
Calcium: 9.4 mg/dL (ref 8.4–10.5)
Chloride: 107 mEq/L (ref 96–112)
Sodium: 142 mEq/L (ref 135–145)

## 2011-06-21 LAB — LIPID PANEL
LDL Cholesterol: 83 mg/dL (ref 0–99)
Total CHOL/HDL Ratio: 3

## 2011-06-21 MED ORDER — CLOTRIMAZOLE-BETAMETHASONE 1-0.05 % EX CREA
TOPICAL_CREAM | Freq: Two times a day (BID) | CUTANEOUS | Status: AC
Start: 2011-06-21 — End: 2012-06-20

## 2011-06-21 MED ORDER — ROSUVASTATIN CALCIUM 20 MG PO TABS: 20.0000 mg | ORAL_TABLET | Freq: Every day | ORAL | Status: DC

## 2011-06-21 MED ORDER — ZOLPIDEM TARTRATE 10 MG PO TABS: ORAL_TABLET | ORAL | Status: DC

## 2011-06-21 MED ORDER — POTASSIUM CHLORIDE CRYS ER 20 MEQ PO TBCR: 20.0000 meq | EXTENDED_RELEASE_TABLET | Freq: Every day | ORAL | Status: DC

## 2011-06-21 MED ORDER — TRIAMTERENE-HCTZ 75-50 MG PO TABS: ORAL_TABLET | ORAL | Status: DC

## 2011-06-21 MED ORDER — METOPROLOL SUCCINATE ER 50 MG PO TB24: 50.0000 mg | ORAL_TABLET | Freq: Every day | ORAL | Status: DC

## 2011-06-21 NOTE — Patient Instructions (Signed)
Today we updated your meds in EPIC...    We refilled your medications per request...  Today we did your follow up CXR & fasting blood work...    Please call the [facility] in a few days for your results...    Dial [id number] & when prompted enter your patient number followed by the # symbol...    Your patient number is:  [id number]#  Let's get on track w/ our diet, & keep up the good work w/ your exercise program...  Call for any questions...  Let's plan a follow up visit in 1 year, sooner if needed for problems.Marland KitchenMarland Kitchen
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
## 2011-06-22 ENCOUNTER — Encounter: Payer: Self-pay | Admitting: Pulmonary Disease

## 2011-07-14 ENCOUNTER — Other Ambulatory Visit: Payer: Self-pay | Admitting: Pulmonary Disease

## 2011-10-17 ENCOUNTER — Telehealth: Payer: Self-pay | Admitting: Pulmonary Disease

## 2011-10-17 NOTE — Telephone Encounter (Signed)
Spoke with pt. She states needs excuse for jury duty. I advised that if she drops off the form we will take care of this. She states will drop off the form tomorrow and I will forward msg to Leigh to keep an eye out for the form.

## 2011-10-18 NOTE — Telephone Encounter (Signed)
Pt dropped off form for jury "summons". i have given this to leigh. Janet Barber

## 2011-10-20 ENCOUNTER — Encounter: Payer: Self-pay | Admitting: *Deleted

## 2011-10-20 NOTE — Telephone Encounter (Signed)
Jury letter has been completed and signed by SN.  Placed in the mail.

## 2011-11-06 ENCOUNTER — Telehealth: Payer: Self-pay | Admitting: Pulmonary Disease

## 2011-11-06 NOTE — Telephone Encounter (Signed)
I spoke with pt and she stated she has been having some cramping on the inside of her right knee for a while. Pt wanted to come in tomorrow so SN or TP can check her out. I scheduled pt an apt with TP tomorrow at 2:30. Pt needed nothing further

## 2011-11-07 ENCOUNTER — Ambulatory Visit (INDEPENDENT_AMBULATORY_CARE_PROVIDER_SITE_OTHER): Payer: Medicare Other | Admitting: Adult Health

## 2011-11-07 ENCOUNTER — Encounter: Payer: Self-pay | Admitting: Adult Health

## 2011-11-07 ENCOUNTER — Telehealth: Payer: Self-pay | Admitting: Adult Health

## 2011-11-07 VITALS — BP 144/98 | HR 77 | Temp 97.9°F | Ht 67.0 in | Wt 209.0 lb

## 2011-11-07 DIAGNOSIS — M25569 Pain in unspecified knee: Secondary | ICD-10-CM

## 2011-11-07 DIAGNOSIS — M25561 Pain in right knee: Secondary | ICD-10-CM | POA: Insufficient documentation
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
## 2011-11-07 NOTE — Telephone Encounter (Signed)
Called and spoke with pt.  clarified directions regarding ibu that were written on her patient instruction sheet from OV today with TP.  Pt verbalized understanding and denied any further questions.

## 2011-11-07 NOTE — Patient Instructions (Signed)
Elevate leg/knee As needed   ICE knee Three times a day  For 20 min  Ibuprofen 200mg  3 tabs Twice daily  For 5-7 days -take with food.  Please contact office for sooner follow up if symptoms do not improve or worsen or seek emergency care  Call back if not improving , will need referral to orthopedics

## 2011-11-07 NOTE — Assessment & Plan Note (Signed)
Mild right knee swelling   Plan:  Elevate leg/knee As needed   ICE knee Three times a day  For 20 min  Ibuprofen 200mg  3 tabs Twice daily  For 5-7 days -take with food.  Please contact office for sooner follow up if symptoms do not improve or worsen or seek emergency care  Call back if not improving , will need referral to orthopedics

## 2011-11-14 ENCOUNTER — Telehealth: Payer: Self-pay | Admitting: Pulmonary Disease

## 2011-11-14 NOTE — Telephone Encounter (Signed)
I spoke with pt and she states her knee cramping is getting better especially since using the ice. Pt states she is going to continue with TP recs and see how she does until next week. Pt states if she still continues to have this cramping will back for referral to ortho. Nothing further was needed

## 2012-01-02 ENCOUNTER — Telehealth: Payer: Self-pay | Admitting: Pulmonary Disease

## 2012-01-02 NOTE — Telephone Encounter (Signed)
Spoke with the pt and she states she does not need a refill at this time. I advised the pt when she needed her meds refilled to call her pharmacy and they will send [place] a request at that time. Pt states understanding.

## 2012-01-30 ENCOUNTER — Other Ambulatory Visit: Payer: Self-pay | Admitting: Pulmonary Disease

## 2012-01-30 NOTE — Telephone Encounter (Signed)
Please advise if ok to refill. Thanks 

## 2012-02-05 ENCOUNTER — Telehealth: Payer: Self-pay | Admitting: Pulmonary Disease

## 2012-02-05 NOTE — Telephone Encounter (Signed)
Last ov with SN and labs have been faxed to Dr. Andi Devon per pts request so Dr. Renae Gloss can update the pts file.  Pt voiced her understanding that this request has been completed.

## 2012-03-20 ENCOUNTER — Other Ambulatory Visit: Payer: Self-pay | Admitting: Pulmonary Disease

## 2012-03-27 ENCOUNTER — Other Ambulatory Visit: Payer: Self-pay | Admitting: Pulmonary Disease

## 2012-06-24 ENCOUNTER — Other Ambulatory Visit: Payer: Self-pay | Admitting: Pulmonary Disease

## 2012-06-25 ENCOUNTER — Encounter: Payer: Self-pay | Admitting: *Deleted

## 2012-06-26 ENCOUNTER — Ambulatory Visit: Payer: Medicare Other | Admitting: Pulmonary Disease

## 2012-06-28 ENCOUNTER — Other Ambulatory Visit: Payer: Self-pay | Admitting: Pulmonary Disease

## 2012-07-03 ENCOUNTER — Other Ambulatory Visit: Payer: Self-pay | Admitting: Pulmonary Disease

## 2012-08-14 ENCOUNTER — Other Ambulatory Visit: Payer: Self-pay | Admitting: Pulmonary Disease

## 2012-08-20 ENCOUNTER — Telehealth: Payer: Self-pay | Admitting: Pulmonary Disease

## 2012-08-20 NOTE — Telephone Encounter (Signed)
Called and spoke with pt and she stated that she is on the zolpidem 10 mg and she takes 1 tablet as needed for sleep.  Her insurance sent her a letter stating that they will only cover a 90 day supply for the year 2014.  Pt stated that she will bring this form and her formulary by tomorrow and drop this off.  Pt is aware that we will have to review her formulary to see if anything other medication is covered.  Pt is aware.

## 2012-08-21 ENCOUNTER — Telehealth: Payer: Self-pay | Admitting: Pulmonary Disease

## 2012-08-21 NOTE — Telephone Encounter (Signed)
SN has reviewed the formulary---pt can get the zolpidem 10 mg   #90   At [facility] for $20 for a 90 day supply and they do not have to file this with her insurance.  Attempted to call pt and make her aware but no answer and no machine to leave a message.

## 2012-08-21 NOTE — Telephone Encounter (Signed)
Noted and see other phone note on this pt.

## 2012-08-21 NOTE — Telephone Encounter (Signed)
ATC NA and no option to leave a msg, WCB.  

## 2012-08-21 NOTE — Telephone Encounter (Signed)
Pt can get the zolpidem 10 mg  #90 from [facility] pharmacy for $20.  If she would like to do this then we can send this in for her.  thanks

## 2012-08-27 MED ORDER — ZOLPIDEM TARTRATE 10 MG PO TABS: 10.0000 mg | ORAL_TABLET | Freq: Every evening | ORAL | Status: DC | PRN

## 2012-08-27 NOTE — Telephone Encounter (Signed)
[facility] will mail this rx to the pt.  She will not have to go to [place] to pick this medication up.  She will have to pay out of pocket for any other medication that her insurance will not cover.   thanks

## 2012-08-27 NOTE — Telephone Encounter (Signed)
Spoke with pt and notified of recs per SN Pt verbalized understanding Rx was called to pharm for the zolpidem and they will ship it to the pt

## 2012-08-27 NOTE — Telephone Encounter (Signed)
Pt called back & stated that we can try to reach her again tomorrow.  Janet Barber

## 2012-08-27 NOTE — Telephone Encounter (Signed)
ATC patient, no answer and no option to leave msg

## 2012-08-27 NOTE — Telephone Encounter (Signed)
Pt states that she doesn't want to go all the way to [facility] to get this medication. She is fine with having it changed to something similar. Please advise. Thanks.

## 2012-09-12 ENCOUNTER — Other Ambulatory Visit: Payer: Self-pay | Admitting: Pulmonary Disease

## 2013-03-26 ENCOUNTER — Telehealth: Payer: Self-pay | Admitting: Pulmonary Disease

## 2013-03-26 MED ORDER — ZOLPIDEM TARTRATE 10 MG PO TABS: 10.0000 mg | ORAL_TABLET | Freq: Every evening | ORAL | Status: DC | PRN

## 2013-03-26 NOTE — Telephone Encounter (Signed)
I spoke with pt. She is requesting a 90 day supply on ambien since she is getting this through [facility] drug. Per pt she has tried other alternatives but nothing helps. This was last refilled 08/2012 #90 x 0 refills. Pt last seen by TP 11/07/11 and SN 06/2011. Please advise SN thanks  No pending appt

## 2013-03-26 NOTE — Telephone Encounter (Signed)
Spoke with pt and notified of recs per SN She verbalized understanding She did not want to make another appt since she is soon due to est care with new PCP and will continue getting refills through new PCP Rx was called to [facility] per pt request.

## 2013-03-26 NOTE — Telephone Encounter (Signed)
Per SN---  Ok to refill the [place] for #90 with no refills.  Pt will need OV with Dr. Kriste Basque for further refills. thanks

## 2013-03-27 ENCOUNTER — Other Ambulatory Visit: Payer: Self-pay | Admitting: Pulmonary Disease

## 2013-07-21 ENCOUNTER — Other Ambulatory Visit: Payer: Self-pay | Admitting: Pulmonary Disease

## 2014-05-18 ENCOUNTER — Encounter: Payer: Self-pay | Admitting: Internal Medicine

## 2014-12-31 ENCOUNTER — Encounter: Payer: Self-pay | Admitting: Internal Medicine

## 2015-01-18 DIAGNOSIS — J301 Allergic rhinitis due to pollen: Secondary | ICD-10-CM | POA: Diagnosis not present

## 2015-01-28 DIAGNOSIS — L859 Epidermal thickening, unspecified: Secondary | ICD-10-CM | POA: Diagnosis not present

## 2015-01-28 DIAGNOSIS — D1721 Benign lipomatous neoplasm of skin and subcutaneous tissue of right arm: Secondary | ICD-10-CM | POA: Diagnosis not present

## 2015-02-08 DIAGNOSIS — J3089 Other allergic rhinitis: Secondary | ICD-10-CM | POA: Diagnosis not present

## 2015-02-08 DIAGNOSIS — J301 Allergic rhinitis due to pollen: Secondary | ICD-10-CM | POA: Diagnosis not present

## 2015-02-26 DIAGNOSIS — D1779 Benign lipomatous neoplasm of other sites: Secondary | ICD-10-CM | POA: Diagnosis not present

## 2015-02-26 DIAGNOSIS — J3089 Other allergic rhinitis: Secondary | ICD-10-CM | POA: Diagnosis not present

## 2015-02-26 DIAGNOSIS — D1721 Benign lipomatous neoplasm of skin and subcutaneous tissue of right arm: Secondary | ICD-10-CM | POA: Diagnosis not present

## 2015-02-26 DIAGNOSIS — J301 Allergic rhinitis due to pollen: Secondary | ICD-10-CM | POA: Diagnosis not present

## 2015-03-18 DIAGNOSIS — J301 Allergic rhinitis due to pollen: Secondary | ICD-10-CM | POA: Diagnosis not present

## 2015-03-18 DIAGNOSIS — J3089 Other allergic rhinitis: Secondary | ICD-10-CM | POA: Diagnosis not present

## 2015-04-05 DIAGNOSIS — J301 Allergic rhinitis due to pollen: Secondary | ICD-10-CM | POA: Diagnosis not present

## 2015-04-05 DIAGNOSIS — J3089 Other allergic rhinitis: Secondary | ICD-10-CM | POA: Diagnosis not present

## 2015-04-12 ENCOUNTER — Encounter: Payer: Self-pay | Admitting: Gastroenterology

## 2015-04-19 DIAGNOSIS — J301 Allergic rhinitis due to pollen: Secondary | ICD-10-CM | POA: Diagnosis not present

## 2015-04-19 DIAGNOSIS — J3089 Other allergic rhinitis: Secondary | ICD-10-CM | POA: Diagnosis not present

## 2015-05-05 DIAGNOSIS — J301 Allergic rhinitis due to pollen: Secondary | ICD-10-CM | POA: Diagnosis not present

## 2015-05-05 DIAGNOSIS — Z91013 Allergy to seafood: Secondary | ICD-10-CM | POA: Diagnosis not present

## 2015-05-05 DIAGNOSIS — J3089 Other allergic rhinitis: Secondary | ICD-10-CM | POA: Diagnosis not present

## 2015-05-05 DIAGNOSIS — Z91018 Allergy to other foods: Secondary | ICD-10-CM | POA: Diagnosis not present

## 2015-05-28 DIAGNOSIS — J3089 Other allergic rhinitis: Secondary | ICD-10-CM | POA: Diagnosis not present

## 2015-05-28 DIAGNOSIS — J301 Allergic rhinitis due to pollen: Secondary | ICD-10-CM | POA: Diagnosis not present

## 2015-06-17 DIAGNOSIS — J3089 Other allergic rhinitis: Secondary | ICD-10-CM | POA: Diagnosis not present

## 2015-06-17 DIAGNOSIS — J301 Allergic rhinitis due to pollen: Secondary | ICD-10-CM | POA: Diagnosis not present

## 2015-06-28 DIAGNOSIS — Z7982 Long term (current) use of aspirin: Secondary | ICD-10-CM | POA: Diagnosis not present

## 2015-06-28 DIAGNOSIS — I10 Essential (primary) hypertension: Secondary | ICD-10-CM | POA: Diagnosis not present

## 2015-06-28 DIAGNOSIS — Z79899 Other long term (current) drug therapy: Secondary | ICD-10-CM | POA: Diagnosis not present

## 2015-06-28 DIAGNOSIS — E784 Other hyperlipidemia: Secondary | ICD-10-CM | POA: Diagnosis not present

## 2015-07-05 DIAGNOSIS — J301 Allergic rhinitis due to pollen: Secondary | ICD-10-CM | POA: Diagnosis not present

## 2015-07-05 DIAGNOSIS — J3089 Other allergic rhinitis: Secondary | ICD-10-CM | POA: Diagnosis not present

## 2015-07-19 DIAGNOSIS — J3089 Other allergic rhinitis: Secondary | ICD-10-CM | POA: Diagnosis not present

## 2015-07-19 DIAGNOSIS — J301 Allergic rhinitis due to pollen: Secondary | ICD-10-CM | POA: Diagnosis not present

## 2015-07-22 DIAGNOSIS — J3081 Allergic rhinitis due to animal (cat) (dog) hair and dander: Secondary | ICD-10-CM | POA: Diagnosis not present

## 2015-07-22 DIAGNOSIS — J3089 Other allergic rhinitis: Secondary | ICD-10-CM | POA: Diagnosis not present

## 2015-08-02 DIAGNOSIS — J301 Allergic rhinitis due to pollen: Secondary | ICD-10-CM | POA: Diagnosis not present

## 2015-08-02 DIAGNOSIS — J3089 Other allergic rhinitis: Secondary | ICD-10-CM | POA: Diagnosis not present

## 2015-08-26 DIAGNOSIS — J301 Allergic rhinitis due to pollen: Secondary | ICD-10-CM | POA: Diagnosis not present

## 2015-08-26 DIAGNOSIS — J3089 Other allergic rhinitis: Secondary | ICD-10-CM | POA: Diagnosis not present

## 2015-09-14 DIAGNOSIS — J301 Allergic rhinitis due to pollen: Secondary | ICD-10-CM | POA: Diagnosis not present

## 2015-09-14 DIAGNOSIS — J3089 Other allergic rhinitis: Secondary | ICD-10-CM | POA: Diagnosis not present

## 2015-09-21 DIAGNOSIS — J301 Allergic rhinitis due to pollen: Secondary | ICD-10-CM | POA: Diagnosis not present

## 2015-09-21 DIAGNOSIS — J3089 Other allergic rhinitis: Secondary | ICD-10-CM | POA: Diagnosis not present

## 2015-09-23 DIAGNOSIS — J3089 Other allergic rhinitis: Secondary | ICD-10-CM | POA: Diagnosis not present

## 2015-09-23 DIAGNOSIS — J301 Allergic rhinitis due to pollen: Secondary | ICD-10-CM | POA: Diagnosis not present

## 2015-09-28 DIAGNOSIS — J3089 Other allergic rhinitis: Secondary | ICD-10-CM | POA: Diagnosis not present

## 2015-09-28 DIAGNOSIS — J301 Allergic rhinitis due to pollen: Secondary | ICD-10-CM | POA: Diagnosis not present

## 2015-09-30 DIAGNOSIS — J3089 Other allergic rhinitis: Secondary | ICD-10-CM | POA: Diagnosis not present

## 2015-09-30 DIAGNOSIS — J301 Allergic rhinitis due to pollen: Secondary | ICD-10-CM | POA: Diagnosis not present

## 2015-10-05 DIAGNOSIS — J301 Allergic rhinitis due to pollen: Secondary | ICD-10-CM | POA: Diagnosis not present

## 2015-10-05 DIAGNOSIS — J3089 Other allergic rhinitis: Secondary | ICD-10-CM | POA: Diagnosis not present

## 2015-10-07 DIAGNOSIS — J301 Allergic rhinitis due to pollen: Secondary | ICD-10-CM | POA: Diagnosis not present

## 2015-10-07 DIAGNOSIS — J3089 Other allergic rhinitis: Secondary | ICD-10-CM | POA: Diagnosis not present

## 2015-10-12 DIAGNOSIS — J3089 Other allergic rhinitis: Secondary | ICD-10-CM | POA: Diagnosis not present

## 2015-10-12 DIAGNOSIS — J301 Allergic rhinitis due to pollen: Secondary | ICD-10-CM | POA: Diagnosis not present

## 2015-10-20 DIAGNOSIS — J301 Allergic rhinitis due to pollen: Secondary | ICD-10-CM | POA: Diagnosis not present

## 2015-10-20 DIAGNOSIS — J3089 Other allergic rhinitis: Secondary | ICD-10-CM | POA: Diagnosis not present

## 2015-10-27 DIAGNOSIS — J3089 Other allergic rhinitis: Secondary | ICD-10-CM | POA: Diagnosis not present

## 2015-10-27 DIAGNOSIS — J301 Allergic rhinitis due to pollen: Secondary | ICD-10-CM | POA: Diagnosis not present

## 2015-11-03 DIAGNOSIS — J3089 Other allergic rhinitis: Secondary | ICD-10-CM | POA: Diagnosis not present

## 2015-11-03 DIAGNOSIS — J301 Allergic rhinitis due to pollen: Secondary | ICD-10-CM | POA: Diagnosis not present

## 2015-11-10 DIAGNOSIS — J3089 Other allergic rhinitis: Secondary | ICD-10-CM | POA: Diagnosis not present

## 2015-11-10 DIAGNOSIS — J301 Allergic rhinitis due to pollen: Secondary | ICD-10-CM | POA: Diagnosis not present

## 2015-11-11 DIAGNOSIS — I10 Essential (primary) hypertension: Secondary | ICD-10-CM | POA: Diagnosis not present

## 2015-11-11 DIAGNOSIS — G4700 Insomnia, unspecified: Secondary | ICD-10-CM | POA: Diagnosis not present

## 2015-11-15 DIAGNOSIS — J3089 Other allergic rhinitis: Secondary | ICD-10-CM | POA: Diagnosis not present

## 2015-11-15 DIAGNOSIS — J301 Allergic rhinitis due to pollen: Secondary | ICD-10-CM | POA: Diagnosis not present

## 2015-11-18 DIAGNOSIS — H2513 Age-related nuclear cataract, bilateral: Secondary | ICD-10-CM | POA: Diagnosis not present

## 2015-11-18 DIAGNOSIS — H35033 Hypertensive retinopathy, bilateral: Secondary | ICD-10-CM | POA: Diagnosis not present

## 2015-11-18 DIAGNOSIS — H43813 Vitreous degeneration, bilateral: Secondary | ICD-10-CM | POA: Diagnosis not present

## 2015-11-22 DIAGNOSIS — J3089 Other allergic rhinitis: Secondary | ICD-10-CM | POA: Diagnosis not present

## 2015-11-22 DIAGNOSIS — J301 Allergic rhinitis due to pollen: Secondary | ICD-10-CM | POA: Diagnosis not present

## 2015-12-07 DIAGNOSIS — J3089 Other allergic rhinitis: Secondary | ICD-10-CM | POA: Diagnosis not present

## 2015-12-07 DIAGNOSIS — J301 Allergic rhinitis due to pollen: Secondary | ICD-10-CM | POA: Diagnosis not present

## 2015-12-13 ENCOUNTER — Other Ambulatory Visit: Payer: Self-pay | Admitting: Nurse Practitioner

## 2015-12-13 ENCOUNTER — Other Ambulatory Visit: Payer: Self-pay | Admitting: Internal Medicine

## 2015-12-13 DIAGNOSIS — Z1231 Encounter for screening mammogram for malignant neoplasm of breast: Secondary | ICD-10-CM

## 2015-12-23 DIAGNOSIS — J3089 Other allergic rhinitis: Secondary | ICD-10-CM | POA: Diagnosis not present

## 2015-12-23 DIAGNOSIS — J301 Allergic rhinitis due to pollen: Secondary | ICD-10-CM | POA: Diagnosis not present

## 2015-12-24 ENCOUNTER — Ambulatory Visit
Admission: RE | Admit: 2015-12-24 | Discharge: 2015-12-24 | Disposition: A | Discharge status: Home / Self Care | Payer: Medicare Other | Source: Ambulatory Visit | Attending: Internal Medicine | Admitting: Internal Medicine

## 2015-12-24 DIAGNOSIS — Z1231 Encounter for screening mammogram for malignant neoplasm of breast: Secondary | ICD-10-CM

## 2015-12-30 ENCOUNTER — Other Ambulatory Visit: Payer: Self-pay | Admitting: Internal Medicine

## 2015-12-30 DIAGNOSIS — N63 Unspecified lump in unspecified breast: Secondary | ICD-10-CM

## 2016-01-06 DIAGNOSIS — J3089 Other allergic rhinitis: Secondary | ICD-10-CM | POA: Diagnosis not present

## 2016-01-06 DIAGNOSIS — J301 Allergic rhinitis due to pollen: Secondary | ICD-10-CM | POA: Diagnosis not present

## 2016-01-10 DIAGNOSIS — J301 Allergic rhinitis due to pollen: Secondary | ICD-10-CM | POA: Diagnosis not present

## 2016-01-10 DIAGNOSIS — J3089 Other allergic rhinitis: Secondary | ICD-10-CM | POA: Diagnosis not present

## 2016-01-12 DIAGNOSIS — G4700 Insomnia, unspecified: Secondary | ICD-10-CM | POA: Diagnosis not present

## 2016-01-12 DIAGNOSIS — R413 Other amnesia: Secondary | ICD-10-CM | POA: Diagnosis not present

## 2016-01-12 DIAGNOSIS — N63 Unspecified lump in breast: Secondary | ICD-10-CM | POA: Diagnosis not present

## 2016-01-12 DIAGNOSIS — I10 Essential (primary) hypertension: Secondary | ICD-10-CM | POA: Diagnosis not present

## 2016-01-17 ENCOUNTER — Ambulatory Visit
Admission: RE | Admit: 2016-01-17 | Discharge: 2016-01-17 | Disposition: A | Discharge status: Home / Self Care | Payer: Medicare Other | Source: Ambulatory Visit | Attending: Internal Medicine | Admitting: Internal Medicine

## 2016-01-17 DIAGNOSIS — N63 Unspecified lump in unspecified breast: Secondary | ICD-10-CM

## 2016-01-19 DIAGNOSIS — J301 Allergic rhinitis due to pollen: Secondary | ICD-10-CM | POA: Diagnosis not present

## 2016-01-19 DIAGNOSIS — J3089 Other allergic rhinitis: Secondary | ICD-10-CM | POA: Diagnosis not present

## 2016-01-26 DIAGNOSIS — D519 Vitamin B12 deficiency anemia, unspecified: Secondary | ICD-10-CM | POA: Diagnosis not present

## 2016-01-26 DIAGNOSIS — J301 Allergic rhinitis due to pollen: Secondary | ICD-10-CM | POA: Diagnosis not present

## 2016-01-26 DIAGNOSIS — J3089 Other allergic rhinitis: Secondary | ICD-10-CM | POA: Diagnosis not present

## 2016-01-31 DIAGNOSIS — J301 Allergic rhinitis due to pollen: Secondary | ICD-10-CM | POA: Diagnosis not present

## 2016-01-31 DIAGNOSIS — J3089 Other allergic rhinitis: Secondary | ICD-10-CM | POA: Diagnosis not present

## 2016-02-02 DIAGNOSIS — D519 Vitamin B12 deficiency anemia, unspecified: Secondary | ICD-10-CM | POA: Diagnosis not present

## 2016-02-07 DIAGNOSIS — J301 Allergic rhinitis due to pollen: Secondary | ICD-10-CM | POA: Diagnosis not present

## 2016-02-07 DIAGNOSIS — J3089 Other allergic rhinitis: Secondary | ICD-10-CM | POA: Diagnosis not present

## 2016-02-09 DIAGNOSIS — D519 Vitamin B12 deficiency anemia, unspecified: Secondary | ICD-10-CM | POA: Diagnosis not present

## 2016-02-11 DIAGNOSIS — J3089 Other allergic rhinitis: Secondary | ICD-10-CM | POA: Diagnosis not present

## 2016-02-11 DIAGNOSIS — J301 Allergic rhinitis due to pollen: Secondary | ICD-10-CM | POA: Diagnosis not present

## 2016-02-15 DIAGNOSIS — J301 Allergic rhinitis due to pollen: Secondary | ICD-10-CM | POA: Diagnosis not present

## 2016-02-15 DIAGNOSIS — J3089 Other allergic rhinitis: Secondary | ICD-10-CM | POA: Diagnosis not present

## 2016-02-28 DIAGNOSIS — J301 Allergic rhinitis due to pollen: Secondary | ICD-10-CM | POA: Diagnosis not present

## 2016-02-28 DIAGNOSIS — J3089 Other allergic rhinitis: Secondary | ICD-10-CM | POA: Diagnosis not present

## 2016-03-01 DIAGNOSIS — J301 Allergic rhinitis due to pollen: Secondary | ICD-10-CM | POA: Diagnosis not present

## 2016-03-01 DIAGNOSIS — J3089 Other allergic rhinitis: Secondary | ICD-10-CM | POA: Diagnosis not present

## 2016-03-06 DIAGNOSIS — J3089 Other allergic rhinitis: Secondary | ICD-10-CM | POA: Diagnosis not present

## 2016-03-06 DIAGNOSIS — J301 Allergic rhinitis due to pollen: Secondary | ICD-10-CM | POA: Diagnosis not present

## 2016-03-07 DIAGNOSIS — D519 Vitamin B12 deficiency anemia, unspecified: Secondary | ICD-10-CM | POA: Diagnosis not present

## 2016-03-14 DIAGNOSIS — J301 Allergic rhinitis due to pollen: Secondary | ICD-10-CM | POA: Diagnosis not present

## 2016-03-14 DIAGNOSIS — J3089 Other allergic rhinitis: Secondary | ICD-10-CM | POA: Diagnosis not present

## 2016-03-16 DIAGNOSIS — J3089 Other allergic rhinitis: Secondary | ICD-10-CM | POA: Diagnosis not present

## 2016-03-16 DIAGNOSIS — J301 Allergic rhinitis due to pollen: Secondary | ICD-10-CM | POA: Diagnosis not present

## 2016-03-23 DIAGNOSIS — J3089 Other allergic rhinitis: Secondary | ICD-10-CM | POA: Diagnosis not present

## 2016-03-23 DIAGNOSIS — J301 Allergic rhinitis due to pollen: Secondary | ICD-10-CM | POA: Diagnosis not present

## 2016-03-27 DIAGNOSIS — E039 Hypothyroidism, unspecified: Secondary | ICD-10-CM | POA: Diagnosis not present

## 2016-03-31 DIAGNOSIS — J3089 Other allergic rhinitis: Secondary | ICD-10-CM | POA: Diagnosis not present

## 2016-03-31 DIAGNOSIS — J301 Allergic rhinitis due to pollen: Secondary | ICD-10-CM | POA: Diagnosis not present

## 2016-04-04 DIAGNOSIS — J3089 Other allergic rhinitis: Secondary | ICD-10-CM | POA: Diagnosis not present

## 2016-04-04 DIAGNOSIS — J301 Allergic rhinitis due to pollen: Secondary | ICD-10-CM | POA: Diagnosis not present

## 2016-04-11 DIAGNOSIS — J3089 Other allergic rhinitis: Secondary | ICD-10-CM | POA: Diagnosis not present

## 2016-04-11 DIAGNOSIS — J301 Allergic rhinitis due to pollen: Secondary | ICD-10-CM | POA: Diagnosis not present

## 2016-04-11 DIAGNOSIS — D519 Vitamin B12 deficiency anemia, unspecified: Secondary | ICD-10-CM | POA: Diagnosis not present

## 2016-04-26 DIAGNOSIS — J301 Allergic rhinitis due to pollen: Secondary | ICD-10-CM | POA: Diagnosis not present

## 2016-04-26 DIAGNOSIS — J3089 Other allergic rhinitis: Secondary | ICD-10-CM | POA: Diagnosis not present

## 2016-05-03 DIAGNOSIS — I10 Essential (primary) hypertension: Secondary | ICD-10-CM | POA: Diagnosis not present

## 2016-05-03 DIAGNOSIS — D519 Vitamin B12 deficiency anemia, unspecified: Secondary | ICD-10-CM | POA: Diagnosis not present

## 2016-05-03 DIAGNOSIS — R413 Other amnesia: Secondary | ICD-10-CM | POA: Diagnosis not present

## 2016-05-03 DIAGNOSIS — E784 Other hyperlipidemia: Secondary | ICD-10-CM | POA: Diagnosis not present

## 2016-05-03 DIAGNOSIS — E039 Hypothyroidism, unspecified: Secondary | ICD-10-CM | POA: Diagnosis not present

## 2016-05-08 DIAGNOSIS — Z91013 Allergy to seafood: Secondary | ICD-10-CM | POA: Diagnosis not present

## 2016-05-08 DIAGNOSIS — J3089 Other allergic rhinitis: Secondary | ICD-10-CM | POA: Diagnosis not present

## 2016-05-08 DIAGNOSIS — J301 Allergic rhinitis due to pollen: Secondary | ICD-10-CM | POA: Diagnosis not present

## 2016-05-08 DIAGNOSIS — Z91018 Allergy to other foods: Secondary | ICD-10-CM | POA: Diagnosis not present

## 2016-05-18 ENCOUNTER — Telehealth: Payer: Self-pay | Admitting: Pulmonary Disease

## 2016-05-18 NOTE — Telephone Encounter (Signed)
Spoke with the pt and explained that SN is no longer taking new PC pt's  She verbalized understanding

## 2016-05-18 NOTE — Telephone Encounter (Signed)
lmtcb x1 for pt. 

## 2016-09-25 ENCOUNTER — Telehealth: Payer: Self-pay | Admitting: Pulmonary Disease

## 2016-09-25 NOTE — Telephone Encounter (Signed)
Caller name:Whitefield,Natalie L Relation to pt: daughter  Call back number:[phone]  Reason for call:  Janet Barber, Janet Barber [id number] patient of Dr. Larose Kells referring her mother to establish care with Dr. Larose Kells, please advise

## 2016-09-25 NOTE — Telephone Encounter (Signed)
ok 

## 2016-09-25 NOTE — Telephone Encounter (Signed)
Please disregard message below daughter called back and stated patient will stay with Dr. Lenna Gilford as PCP .

## 2016-10-03 ENCOUNTER — Emergency Department (HOSPITAL_COMMUNITY): Payer: Medicare Other

## 2016-10-03 ENCOUNTER — Encounter (HOSPITAL_COMMUNITY): Payer: Self-pay

## 2016-10-03 ENCOUNTER — Emergency Department (HOSPITAL_COMMUNITY)
Admission: EM | Admit: 2016-10-03 | Discharge: 2016-10-03 | Disposition: A | Discharge status: Home / Self Care | Payer: Medicare Other | Attending: Emergency Medicine | Admitting: Emergency Medicine

## 2016-10-03 DIAGNOSIS — Y999 Unspecified external cause status: Secondary | ICD-10-CM | POA: Insufficient documentation

## 2016-10-03 DIAGNOSIS — M79641 Pain in right hand: Secondary | ICD-10-CM | POA: Insufficient documentation

## 2016-10-03 DIAGNOSIS — Z87891 Personal history of nicotine dependence: Secondary | ICD-10-CM | POA: Insufficient documentation

## 2016-10-03 DIAGNOSIS — W07XXXA Fall from chair, initial encounter: Secondary | ICD-10-CM | POA: Diagnosis not present

## 2016-10-03 DIAGNOSIS — I10 Essential (primary) hypertension: Secondary | ICD-10-CM | POA: Insufficient documentation

## 2016-10-03 DIAGNOSIS — Z7982 Long term (current) use of aspirin: Secondary | ICD-10-CM | POA: Diagnosis not present

## 2016-10-03 DIAGNOSIS — M79601 Pain in right arm: Secondary | ICD-10-CM

## 2016-10-03 DIAGNOSIS — Y9389 Activity, other specified: Secondary | ICD-10-CM | POA: Diagnosis not present

## 2016-10-03 DIAGNOSIS — Z79899 Other long term (current) drug therapy: Secondary | ICD-10-CM | POA: Insufficient documentation

## 2016-10-03 DIAGNOSIS — Y929 Unspecified place or not applicable: Secondary | ICD-10-CM | POA: Diagnosis not present

## 2016-10-03 DIAGNOSIS — Z85038 Personal history of other malignant neoplasm of large intestine: Secondary | ICD-10-CM | POA: Insufficient documentation

## 2016-10-03 MED ORDER — ACETAMINOPHEN 325 MG PO TABS: 650.0000 mg | ORAL_TABLET | Freq: Once | ORAL | Status: DC

## 2016-10-03 NOTE — Discharge Instructions (Signed)
Your x-ray shows no fracture today. This is likely a sprain. Wear the wrist splint as needed. Please rest, ice, elevate your right arm and hand. Use Tylenol and ibuprofen for pain and swelling. Please follow-up with your primary care doctor and orthopedist for further imaging if symptoms persist for possibility of a missed fracture. Your blood pressure was slightly elevated in the ED today. This is likely due to pain. You will need to follow up with her primary care doctor and have your blood pressure rechecked. Return to the Ed if your symptoms worsen.

## 2016-10-03 NOTE — ED Notes (Signed)
Pt given ice pack for hand and ring was removed off that hand.

## 2016-10-03 NOTE — ED Provider Notes (Signed)
[facility] DEPT Provider Note   CSN: [id number] Arrival date & time: 10/03/16  1045     History   Chief Complaint Chief Complaint  Patient presents with  . Hand Injury    HPI Janet Barber is a 76 y.o. female.  HPI 76 yo female with pmh sig for anxiety, htn that presents to the ED today with complaint of right hand pain after a mechanical fall. Pt went to sit down and the chair gave way and the patient fell fall on her right hand. She denies hitting her head or neck and loc. She complains of right hand, elbow and shoulder pain. Moving makes the pain worse. Nothing makes the pain better. She did not tried anything for the pain at home. Denies any ha, vision changes, neck pain, weakness, parathesias. Hx of htn pt did not take her bp meds this am. Past Medical History:  Diagnosis Date  . Allergic rhinitis   . Anxiety   . Colon cancer ([place])   . Diverticulosis of colon   . History of hemorrhoids   . Hx of colonic polyps   . Hypercholesterolemia   . Hypertension   . Lumbar back pain   . Mitral valve prolapse   . Overweight(278.02)   . Venous insufficiency   . Vitamin D deficiency     Patient Active Problem List   Diagnosis Date Noted  . Knee pain, right 11/07/2011  . BACK PAIN, LUMBAR 06/19/2010  . VENOUS INSUFFICIENCY 06/12/2009  . VITAMIN D DEFICIENCY 06/09/2009  . COLON CANCER 06/07/2008  . DIVERTICULOSIS OF COLON 06/07/2008  . COLONIC POLYPS 06/03/2008  . HYPERCHOLESTEROLEMIA 06/03/2008  . OVERWEIGHT 06/03/2008  . ANXIETY 06/03/2008  . HYPERTENSION 06/03/2008  . MITRAL VALVE PROLAPSE 06/03/2008  . HEMORRHOIDS 06/03/2008  . ALLERGIC RHINITIS 06/03/2008    Past Surgical History:  Procedure Laterality Date  . ABDOMINAL HYSTERECTOMY  1974  . benign breast biopsy  2006  . UMBILICAL HERNIA REPAIR  1988    OB History    No data available       Home Medications    Prior to Admission medications   Medication Sig Start Date End Date Taking? Authorizing  Provider  aspirin 81 MG tablet Take 81 mg by mouth daily.     Yes Historical Provider, MD  Aspirin-Caffeine (BC FAST PAIN RELIEF) 845-65 MG PACK As needed    Yes Historical Provider, MD  atorvastatin (LIPITOR) 20 MG tablet Take 10 mg by mouth daily.    Yes Historical Provider, MD  levothyroxine (SYNTHROID, LEVOTHROID) 25 MCG tablet Take 25 mcg by mouth daily. 09/13/16  Yes Historical Provider, MD  Multiple Vitamins-Minerals (WOMENS MULTIVITAMIN PLUS PO) Take 1 tablet by mouth daily.     Yes Historical Provider, MD  valsartan-hydrochlorothiazide (DIOVAN-HCT) 320-25 MG tablet Take 1 tablet by mouth daily. 09/27/16  Yes Historical Provider, MD  Cholecalciferol (VITAMIN D) 2000 UNITS CAPS Take 1 capsule by mouth daily.      Historical Provider, MD  clotrimazole-betamethasone (LOTRISONE) cream apply to affected area twice a day Patient not taking: Reported on 10/03/2016 07/21/13   Noralee Space, MD  CRESTOR 20 MG tablet take 1 tablet by mouth at bedtime Patient not taking: Reported on 10/03/2016 08/14/12   Noralee Space, MD  KLOR-CON M20 20 MEQ tablet take 1 tablet by mouth once daily Patient not taking: Reported on 10/03/2016 06/24/12   Noralee Space, MD  metoprolol succinate (TOPROL-XL) 50 MG 24 hr tablet take 1 tablet by mouth  once daily Patient not taking: Reported on 10/03/2016 03/20/12   Noralee Space, MD  triamterene-hydrochlorothiazide [facility]) 75-50 MG per tablet Take 1/2 tablet by mouth daily Patient not taking: Reported on 10/03/2016 06/21/11   Noralee Space, MD  zolpidem (AMBIEN) 10 MG tablet Take 1 tablet (10 mg total) by mouth at bedtime as needed. Patient not taking: Reported on 10/03/2016 03/26/13   Noralee Space, MD    Family History No family history on file.  Social History Social History  Substance Use Topics  . Smoking status: Former Smoker    Types: Cigarettes    Quit date: 09/18/1986  . Smokeless tobacco: Never Used  . Alcohol use No     Allergies   Penicillins   Review of  Systems Review of Systems  Constitutional: Negative for chills and fever.  Eyes: Negative for visual disturbance.  Musculoskeletal: Positive for arthralgias and myalgias. Negative for neck pain and neck stiffness.  Skin: Negative for color change and wound.  Neurological: Negative for dizziness, weakness, light-headedness, numbness and headaches.  All other systems reviewed and are negative.    Physical Exam Updated Vital Signs BP (!) 164/124   Pulse 113   Temp 98 F (36.7 C)   Resp 16   Ht 5\' 6"  (1.676 m)   Wt 83.5 kg   SpO2 99%   BMI 29.70 kg/m   Physical Exam  Constitutional: She is oriented to person, place, and time. She appears well-developed and well-nourished. No distress.  HENT:  Head: Normocephalic and atraumatic.  Eyes: Pupils are equal, round, and reactive to light. Right eye exhibits no discharge. Left eye exhibits no discharge. No scleral icterus.  Neck: Normal range of motion. Neck supple.  No midline C spine tenderness. Full rom. No deformity or step off noted.  Cardiovascular: Intact distal pulses.   Pulmonary/Chest: No respiratory distress.  Musculoskeletal: Normal range of motion.  Mild edema and tenderness noted to right thenar emmenice. Full ROM of right hand, wrist, elbow and shoulder. No deformity noted. No tenderness to the schapoid region. Cap refill normal. Sensation intact. Radial pulses are 2+ bilat. No tenderness to palpation of the right shoulder or elbow joint.   Neurological: She is alert and oriented to person, place, and time.  The patient is alert, attentive, and oriented x 3. Speech is clear. Cranial nerve II-VII grossly intact. Negative pronator drift. Sensation intact. Strength 5/5 in all extremities. Reflexes 2+ and symmetric at biceps, triceps, knees, and ankles. Rapid alternating movement and fine finger movements intact. Romberg is absent. Posture and gait normal.   Skin: Skin is warm and dry. Capillary refill takes less than 2 seconds.  No pallor.  Nursing note and vitals reviewed.    ED Treatments / Results  Labs (all labs ordered are listed, but only abnormal results are displayed) Labs Reviewed - No data to display  EKG  EKG Interpretation None       Radiology Dg Shoulder Right  Result Date: 10/03/2016 CLINICAL DATA:  Right shoulder pain since an injury the patient suffered when a chair she was sitting in this morning broke. Initial encounter. EXAM: RIGHT SHOULDER - 2+ VIEW COMPARISON:  None. FINDINGS: There is no evidence of fracture or dislocation. There is no evidence of arthropathy or other focal bone abnormality. Soft tissues are unremarkable. IMPRESSION: Negative exam. Electronically Signed   By: Inge Rise M.D.   On: 10/03/2016 12:44   Dg Elbow Complete Right  Result Date: 10/03/2016 CLINICAL DATA:  Right  elbow pain due to an injury suffered when a chair the patient was sitting in this morning broke. Initial encounter. EXAM: RIGHT ELBOW - COMPLETE 3+ VIEW COMPARISON:  None. FINDINGS: There is no evidence of fracture, dislocation, or joint effusion. There is no evidence of arthropathy or other focal bone abnormality. Soft tissues are unremarkable. IMPRESSION: Negative exam. Electronically Signed   By: Inge Rise M.D.   On: 10/03/2016 12:43   Dg Wrist Complete Right  Result Date: 10/03/2016 CLINICAL DATA:  Right wrist pain since an injury this morning the patient suffered when a chair she was sitting and broke. Initial encounter. EXAM: RIGHT WRIST - COMPLETE 3+ VIEW COMPARISON:  None. FINDINGS: There is no evidence of fracture or dislocation. Mild first CMC osteoarthritis is noted. Soft tissues are unremarkable. IMPRESSION: Negative exam. Electronically Signed   By: Inge Rise M.D.   On: 10/03/2016 12:45   Dg Hand Complete Right  Result Date: 10/03/2016 CLINICAL DATA:  Right hand pain and swelling since a chair the patient was sitting and broke this morning. Initial encounter. EXAM: RIGHT  HAND - COMPLETE 3+ VIEW COMPARISON:  None. FINDINGS: No acute bony or joint abnormality is identified. Mild first CMC osteoarthritis is noted. Soft tissues are unremarkable. IMPRESSION: Negative exam. Electronically Signed   By: Inge Rise M.D.   On: 10/03/2016 12:42    Procedures Procedures (including critical care time)  Medications Ordered in ED Medications - No data to display   Initial Impression / Assessment and Plan / ED Course  I have reviewed the triage vital signs and the nursing notes.  Pertinent labs & imaging results that were available during my care of the patient were reviewed by me and considered in my medical decision making (see chart for details).  Clinical Course   Patient X-Ray negative for obvious fracture or dislocation. Pain managed in ED. Pt advised to follow up with orthopedics if symptoms persist for possibility of missed fracture diagnosis. Patient given brace while in ED, conservative therapy recommended and discussed. BP and hr elevated in ED. Likely due to pain. Pt sustained mechanical fall and denies any syncope or loc. She denies any sob, cp, ha, or vision changes. Pt states the is anxious and ready to be discharged. Encouraged pt to take her bp meds when she gets home. Encouraged tylenol and NSAIDs for pain. Follow up with pcp for bp and hr recheck. Pt is hemodynamically stable, in NAD, & able to ambulate in the ED. Pain has been managed & has no complaints prior to dc. Pt is comfortable with above plan and is stable for discharge at this time. All questions were answered prior to disposition. Strict return precautions for f/u to the ED were discussed. Dicussed pt with Dr. Theodosia Blender who is agreeable to the above plan.    Final Clinical Impressions(s) / ED Diagnoses   Final diagnoses:  Right hand pain  Right arm pain    New Prescriptions Discharge Medication List as of 10/03/2016  1:16 PM       Doristine Devoid, PA-C 10/03/16 [place], MD 10/07/16 1524

## 2016-10-03 NOTE — ED Triage Notes (Signed)
Pt. Went to sit down and the chair gave away and pt. [place] and tried to catch her self.  She is having rt. Hand pain and arm and shoulder pain.  Rt. Palm is swollen.  Ab le to move fingers, no deformity noted.

## 2017-09-28 ENCOUNTER — Other Ambulatory Visit: Payer: Self-pay | Admitting: Nurse Practitioner

## 2017-09-28 ENCOUNTER — Other Ambulatory Visit: Payer: Self-pay | Admitting: Internal Medicine

## 2017-09-28 DIAGNOSIS — E2839 Other primary ovarian failure: Secondary | ICD-10-CM

## 2017-10-25 IMAGING — CR DG ELBOW COMPLETE 3+V*R*
4 series · 4 of 4 positions shown · non-contrast
Comparison: None.

CLINICAL DATA: Right elbow pain due to an injury suffered when a
chair the patient was sitting in this morning broke. Initial
encounter.

EXAM:
RIGHT ELBOW - COMPLETE 3+ VIEW

[elbow ap]
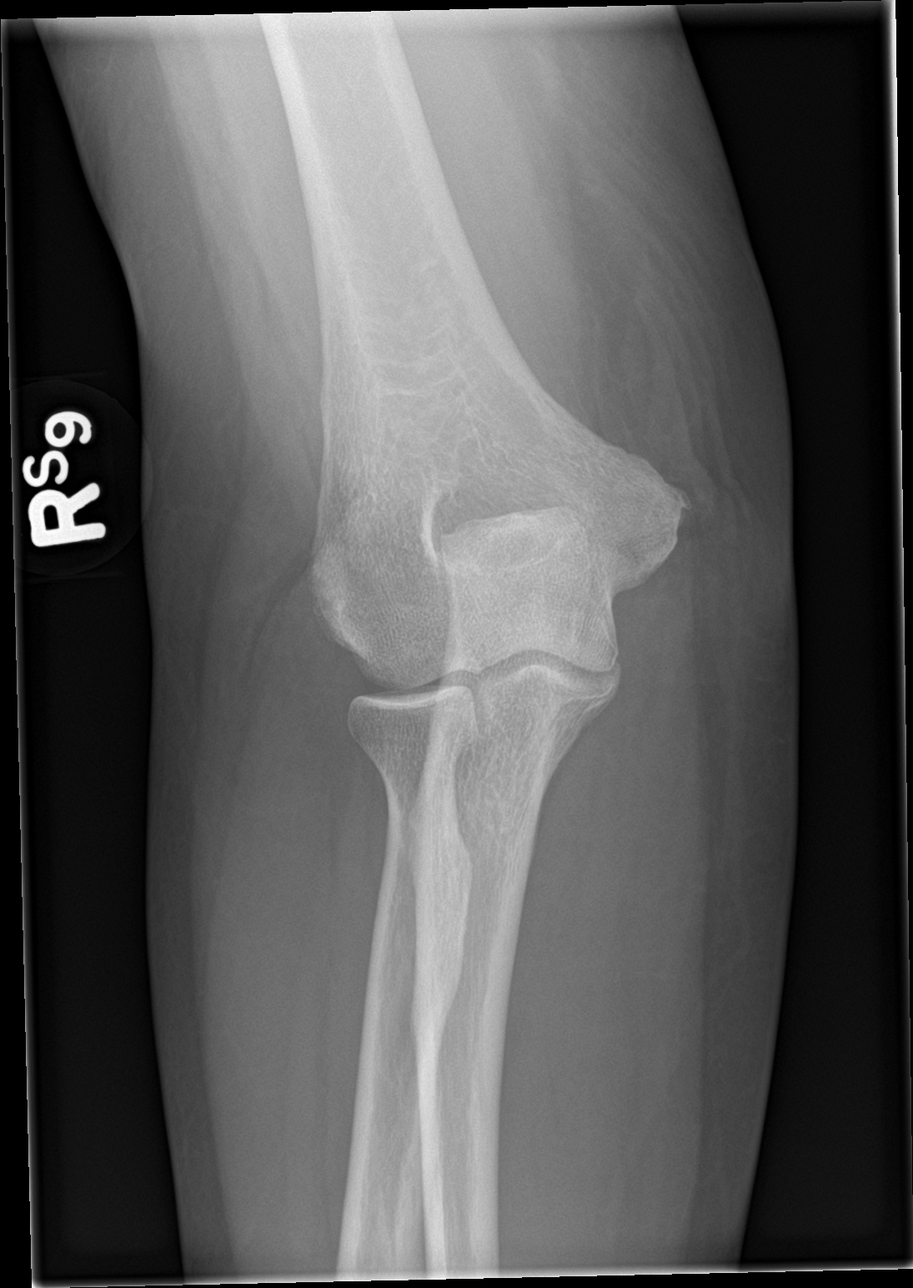

[elbow lat]
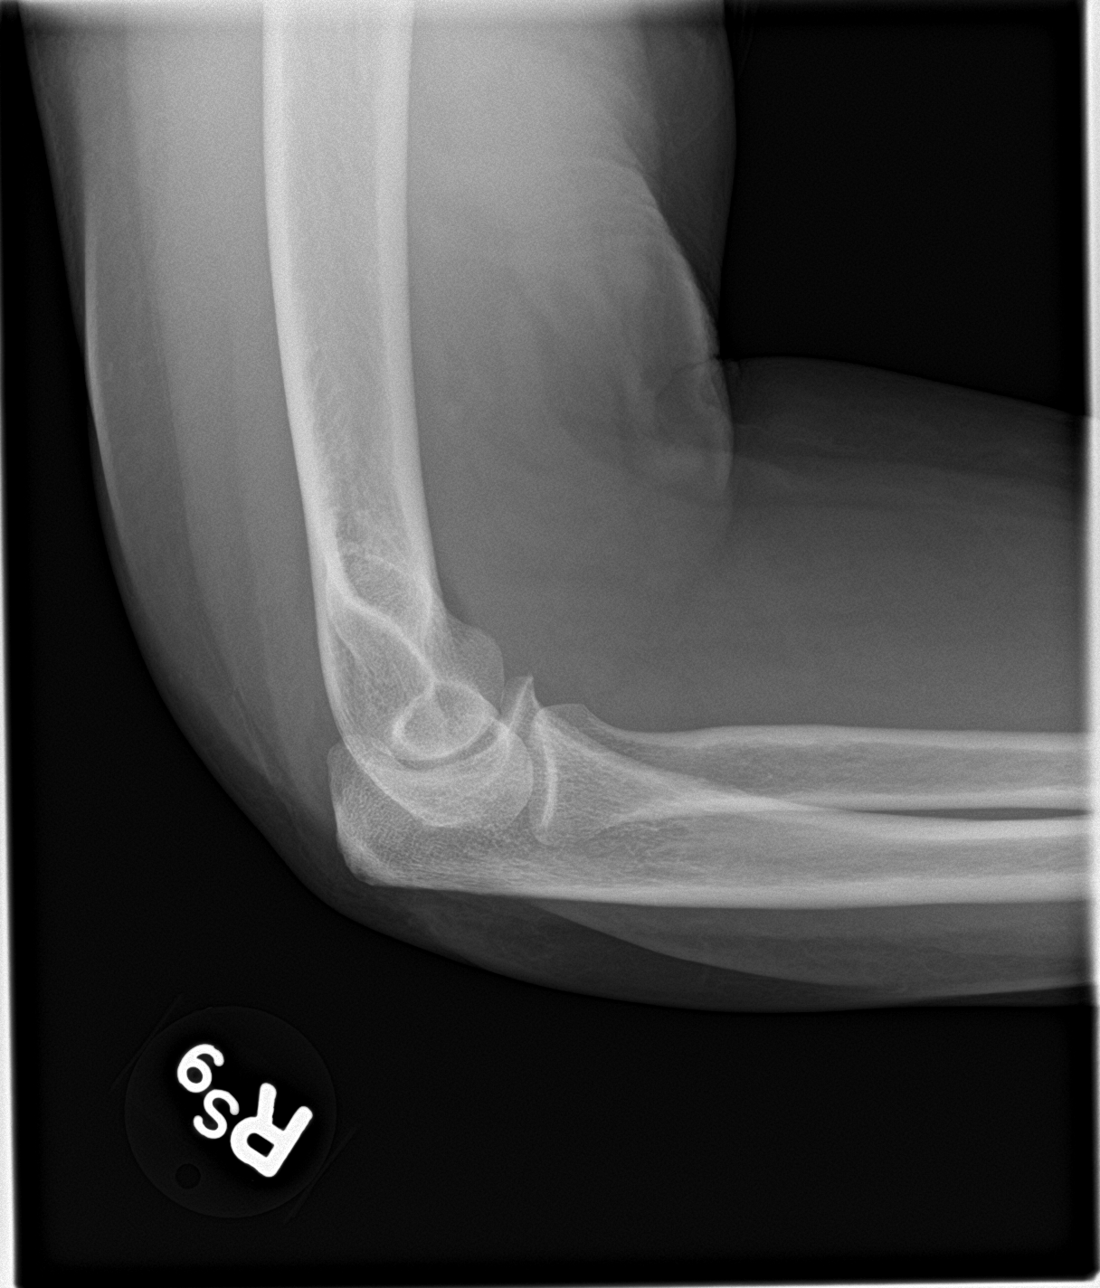

[elbow obl (1 of 2)]
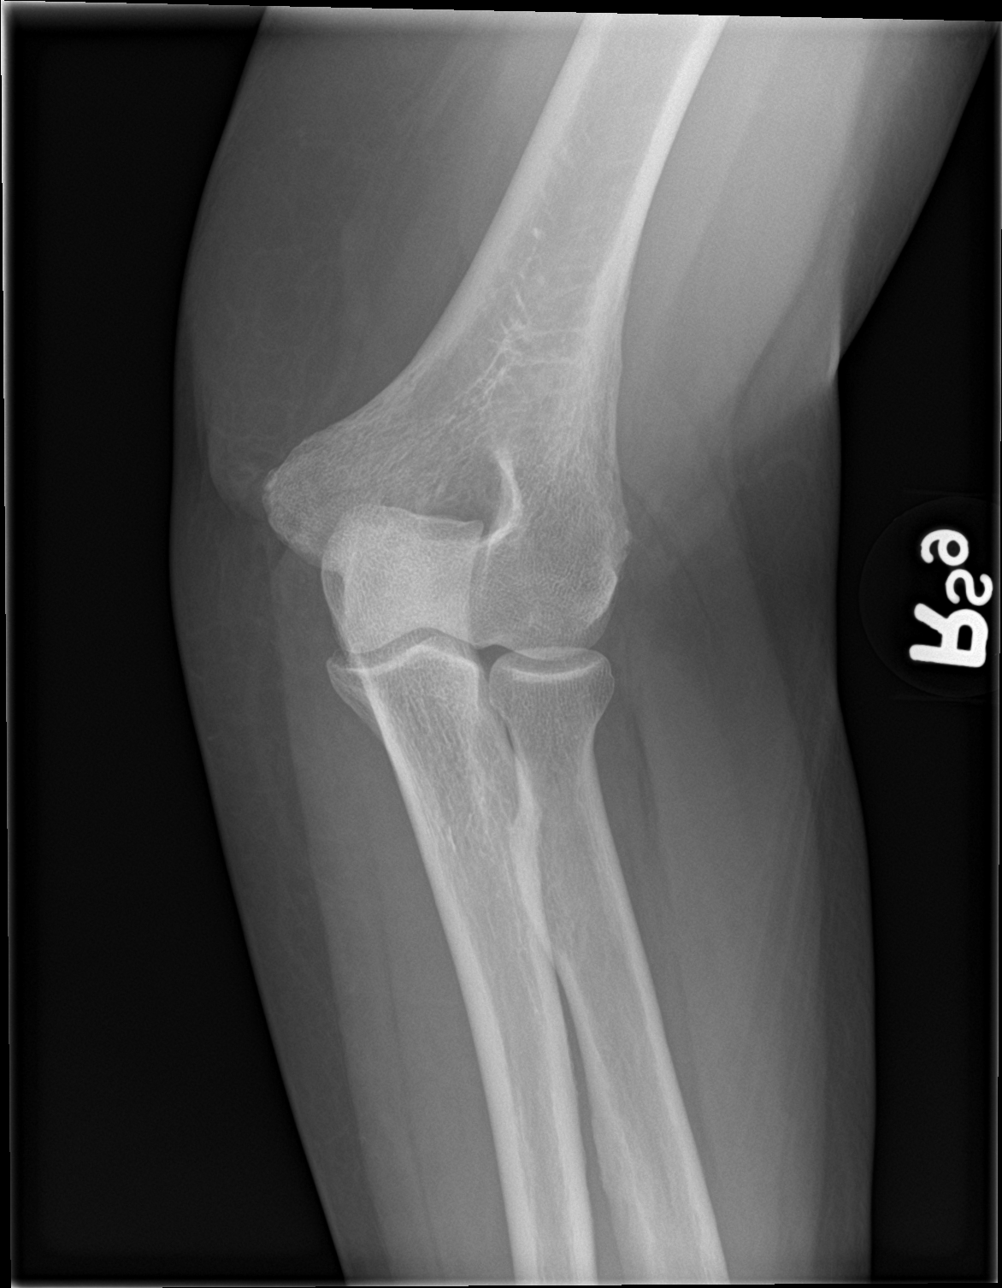

[elbow obl (2 of 2)]
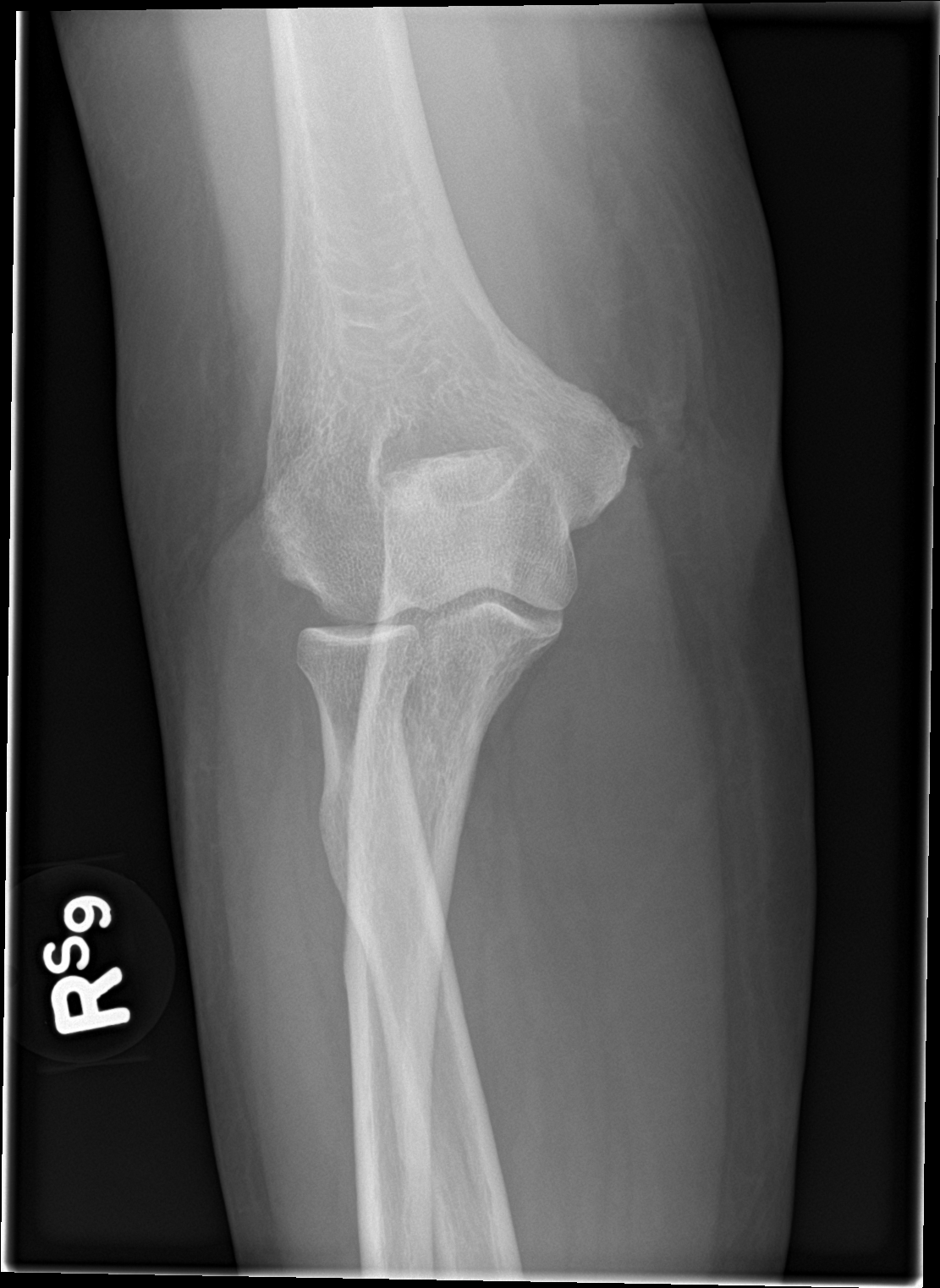

[4 of 4 positions shown; findings below may reference images not displayed]

FINDINGS: There is no evidence of fracture, dislocation, or joint effusion.
There is no evidence of arthropathy or other focal bone abnormality.
Soft tissues are unremarkable.
IMPRESSION: Negative exam.

## 2018-01-30 ENCOUNTER — Ambulatory Visit
Admission: RE | Admit: 2018-01-30 | Discharge: 2018-01-30 | Disposition: A | Discharge status: Home / Self Care | Payer: Medicare Other | Source: Ambulatory Visit | Attending: Plastic Surgery | Admitting: Plastic Surgery

## 2018-01-30 ENCOUNTER — Ambulatory Visit: Payer: Medicare Other

## 2018-01-30 ENCOUNTER — Other Ambulatory Visit: Payer: Self-pay | Admitting: Plastic Surgery

## 2018-01-30 ENCOUNTER — Other Ambulatory Visit: Payer: Self-pay | Admitting: Nurse Practitioner

## 2018-01-30 DIAGNOSIS — Z1231 Encounter for screening mammogram for malignant neoplasm of breast: Secondary | ICD-10-CM

## 2018-03-26 ENCOUNTER — Encounter (HOSPITAL_BASED_OUTPATIENT_CLINIC_OR_DEPARTMENT_OTHER): Payer: Self-pay | Admitting: *Deleted

## 2018-03-27 ENCOUNTER — Encounter (HOSPITAL_BASED_OUTPATIENT_CLINIC_OR_DEPARTMENT_OTHER)
Admission: RE | Admit: 2018-03-27 | Discharge: 2018-03-27 | Disposition: A | Discharge status: Home / Self Care | Payer: Medicare Other | Source: Ambulatory Visit | Attending: Plastic Surgery | Admitting: Plastic Surgery

## 2018-03-27 DIAGNOSIS — I10 Essential (primary) hypertension: Secondary | ICD-10-CM | POA: Insufficient documentation

## 2018-03-27 DIAGNOSIS — Z01810 Encounter for preprocedural cardiovascular examination: Secondary | ICD-10-CM | POA: Diagnosis not present

## 2018-03-27 DIAGNOSIS — Z01812 Encounter for preprocedural laboratory examination: Secondary | ICD-10-CM | POA: Insufficient documentation

## 2018-03-27 DIAGNOSIS — I499 Cardiac arrhythmia, unspecified: Secondary | ICD-10-CM | POA: Diagnosis not present

## 2018-03-27 LAB — CBC WITH DIFFERENTIAL/PLATELET
ABS IMMATURE GRANULOCYTES: 0 10*3/uL (ref 0.0–0.1)
Basophils Absolute: 0 10*3/uL (ref 0.0–0.1)
Basophils Relative: 1 %
Eosinophils Absolute: 0.2 10*3/uL (ref 0.0–0.7)
Eosinophils Relative: 4 %
HCT: 41.3 % (ref 36.0–46.0)
HEMOGLOBIN: 12.9 g/dL (ref 12.0–15.0)
Immature Granulocytes: 0 %
LYMPHS PCT: 25 %
Lymphs Abs: 1.3 10*3/uL (ref 0.7–4.0)
MCH: 29 pg (ref 26.0–34.0)
MCHC: 31.2 g/dL (ref 30.0–36.0)
MCV: 92.8 fL (ref 78.0–100.0)
MONO ABS: 0.7 10*3/uL (ref 0.1–1.0)
MONOS PCT: 14 %
NEUTROS ABS: 2.8 10*3/uL (ref 1.7–7.7)
Neutrophils Relative %: 56 %
Platelets: 228 10*3/uL (ref 150–400)
RBC: 4.45 MIL/uL (ref 3.87–5.11)
RDW: 13.4 % (ref 11.5–15.5)
WBC: 5 10*3/uL (ref 4.0–10.5)

## 2018-03-27 LAB — BASIC METABOLIC PANEL
ANION GAP: 8 (ref 5–15)
BUN: 15 mg/dL (ref 8–23)
CO2: 28 mmol/L (ref 22–32)
Calcium: 9.4 mg/dL (ref 8.9–10.3)
Chloride: 106 mmol/L (ref 98–111)
Creatinine, Ser: 1.1 mg/dL — ABNORMAL HIGH (ref 0.44–1.00)
GFR calc Af Amer: 55 mL/min — ABNORMAL LOW (ref >60)
GFR calc non Af Amer: 48 mL/min — ABNORMAL LOW (ref >60)
GLUCOSE: 106 mg/dL — AB (ref 70–99)
POTASSIUM: 3.4 mmol/L — AB (ref 3.5–5.1)
Sodium: 142 mmol/L (ref 135–145)

## 2018-03-27 NOTE — Pre-Procedure Instructions (Signed)
Dr. Gifford Shave reviewed EKG and history - ok for surgery. Pt given Ensure and instructed to drink by 0415 day of surgery with teach back method.

## 2018-03-30 NOTE — H&P (Signed)
Subjective:     Patient ID: Janet Barber is a 77 y.o. female.  HPI   Janet Barber is a 76 yo female here for pre operative history and physical prior to excision of left breast mass and breast reduction following excision with reduction on contralateral breast for symmetry.  History:  Referred by Dr. Karlton Lemon for consultation left breast mass that has been present for years with slow continued growth, increased firmness. Last imaging 2017 MMG/US, as below. States onset mass 1999 or 2000, reports fall in parking lot while going to get MMG and development following this.Current  38 DDD, reports stable wt over last year. Reports associated back pain for years, denied numbness hands, denies rashes. Feels back pain worse with growth of left breast mass. She takes OTC medication for back pain as needed, has been specialty fitted for bras.   No family history breast or ovarian ca. Lives with daughter, who had breast reduction with Dr. Towanda Malkin.  CLINICAL DATA: 77 year old patient presents for annual examination. She has a long-standing large soft mobile lump in the upper outer left breast, previously described to be a benign lipoma on studies from [facility] mammography. In 2012, a focal asymmetry within the upper-outer quadrant fatty mass developed, and was felt to be due to benign fat necrosis within the previously existing lipoma. The patient had had a significant fall with bruising to the breast, felt to account for the area of previously described probable fat necrosis, as she describes to me today. The patient describes to me that she had two significant falls in the past, both resulting in trauma to the left breast.  The last time she was seen was for mammography was in December 2014. She was recalled for evaluation of calcifications in the left breast. She never returned for further evaluation. The patient feels that the large soft mass in the upper outer left breast feels unchanged. She  has had no new episodes of trauma.  EXAM: 2D DIGITAL DIAGNOSTIC BILATERAL MAMMOGRAM WITH CAD AND ADJUNCT TOMO  ULTRASOUND LEFT BREAST  COMPARISON: Previous exam(s) dated 09/01/2013, 04/10/2011, 04/05/2011 from [facility] mammography.  ACR Breast Density Category b: There are scattered areas of fibroglandular density.  FINDINGS: A large lucent fatty mass in the upper outer left breast with a thin capsule, measuring approximately 15 cm by mammography, is stable and consistent with a hamartoma or lipoma. Previously described probable area of fat necrosis in the outer left breast on the mammogram of 2012 has retracted, and developed coarse calcifications consistent with calcifying benign fat necrosis. Circumscribed oval mass along the lateral margin of the benign fatty mass is most consistent with a benign intramammary lymph node and is stable. A possible asymmetry in the medial right breast resolves with spot compression. No new or suspicious findings are identified in either breast to suggest malignancy. Stable appearances bilaterally. Mammographic images were processed with CAD. On physical exam, there is an approximately 15 cm soft mobile mass in the upper-outer quadrant of the left breast. Targeted ultrasound is performed, showing a fat containing mass in the upper outer left breast consistent with a benign hamartoma or a lipoma. No suspicious mass is identified in the upper outer left breast.  IMPRESSION: Chronic lipoma versus hamartoma in the upper-outer quadrant of the left breast. Calcifying fat necrosis within the lipoma versus hamartoma. No evidence of malignancy bilaterally. RECOMMENDATION: Screening mammogram in one year.(Code:SM-B-01Y) I have discussed the findings and recommendations with the patient. Results were also provided in writing at the conclusion  of the visit. If applicable, a reminder letter will be sent to the patient regarding the next  appointment. BI-RADS CATEGORY 2: Benign.  Most recent mammogram 01/30/2018 with TOMO and CAD showed scattered areas of fibroglandular density. No findings suspicious for malignancy , BI-RADS 1: Negative.       Past Medical History:  Diagnosis Date  . Allergic rhinitis   . Anxiety   . Colon cancer ([place])   . Diverticulosis of colon   . History of hemorrhoids   . Hx of colonic polyps   . Hypercholesterolemia   . Hypertension   . Lumbar back pain   . Mitral valve prolapse   . Overweight(278.02)   . Venous insufficiency   . Vitamin D deficiency          Past Surgical History:  Procedure Laterality Date  . ABDOMINAL HYSTERECTOMY  1974  . benign breast biopsy  2006  . UMBILICAL HERNIA REPAIR  1988        Allergies  Allergen Reactions  . Shellfish Containing Products Shortness Of Breath (ALLERGY/intolerance)    Current Outpatient Prescriptions:  .  atorvastatin (LIPITOR) 10 MG tablet, , Disp: , Rfl:  .  fluticasone propionate (FLONASE) 50 mcg/actuation nasal spray, , Disp: , Rfl:  .  hydrocortisone 1 % cream, , Disp: , Rfl:  .  levothyroxine (SYNTHROID, LEVOTHROID) 25 MCG tablet, , Disp: , Rfl:  .  valsartan-hydrochlorothiazide (DIOVAN-HCT) 320-25 mg per tablet, , Disp: , Rfl:   She has a supportive daughter who will be assisting with her care following the surgery.   The following portions of the patient's history were reviewed and updated as appropriate: allergies, current medications, past family history, past medical history, past social history, past surgical history and problem list.  Review of Systems  Constitutional: Negative.   HENT: Negative.   Eyes: Negative.   Respiratory: Negative.   Cardiovascular: Negative.   Gastrointestinal: Negative.   Endocrine: Negative.   Genitourinary: Negative.   Musculoskeletal: Positive for arthralgias, back pain, myalgias, neck pain and neck stiffness.  Skin: Negative.   Allergic/Immunologic:  Positive for food allergies (fish, shellfish).  Neurological: Positive for headaches.       She reports frequent HA and takes "BC powders" regularly. Patient was cautioned to stop taking BC powders now and until after surgery. Discussed use of Tylenol for HA prior to surgery with patient.   Hematological: Negative.   Psychiatric/Behavioral: Negative.     Ref Range & Units 2d ago  WBC 4.0 - 10.5 K/uL 5.0   RBC 3.87 - 5.11 MIL/uL 4.45   Hemoglobin 12.0 - 15.0 g/dL 12.9   HCT 36.0 - 46.0 % 41.3   MCV 78.0 - 100.0 fL 92.8   MCH 26.0 - 34.0 pg 29.0   MCHC 30.0 - 36.0 g/dL 31.2   RDW 11.5 - 15.5 % 13.4   Platelets 150 - 400 K/uL 228   Neutrophils Relative % % 56   Neutro Abs 1.7 - 7.7 K/uL 2.8   Lymphocytes Relative % 25   Lymphs Abs 0.7 - 4.0 K/uL 1.3   Monocytes Relative % 14   Monocytes Absolute 0.1 - 1.0 K/uL 0.7   Eosinophils Relative % 4   Eosinophils Absolute 0.0 - 0.7 K/uL 0.2   Basophils Relative % 1   Basophils Absolute 0.0 - 0.1 K/uL 0.0   Immature Granulocytes % 0   Abs Immature Granulocytes 0.0 - 0.1 K/uL 0.0     Ref Range & Units 2d ago  Sodium 135 - 145 mmol/L 142   Potassium 3.5 - 5.1 mmol/L 3.4Low    Chloride 98 - 111 mmol/L 106   CO2 22 - 32 mmol/L 28   Glucose, Bld 70 - 99 mg/dL 106High    BUN 8 - 23 mg/dL 15   Creatinine, Ser 0.44 - 1.00 mg/dL 1.10High    Calcium 8.9 - 10.3 mg/dL 9.4   GFR calc non Af Amer >60 mL/min 48Low    GFR calc Af Amer >60 mL/min 55Low            Objective:   Physical Exam  Constitutional: She is oriented to person, place, and time. She appears well-developed and well-nourished. No distress.  Cardiovascular: Normal rate, regular rhythm, normal heart sounds and intact distal pulses.   No murmur heard. Pulmonary/Chest: Effort normal and breath sounds normal. No respiratory distress.   Bilateral breasts are extremely large with left larger than right breast and with large palpable mass of the left lateral breast.  Grade 3  ptosis bilateral, left> right volume with obvious mass protruding UOQ as noted in imaging appr 15 cm SN to nipple R 38 L 38 cm BW R 22 L 24 cm Nipple to IMF R 14 L 16 cm +shoulder grooving       Assessment:     1. Macromastia    2. Left breast mass       Plan:     Large left breast mass, thought to be benign on imaging in setting of macromastia, chronic back pain. Plan resection of this and would plan completion of this from breast reduction incisions. Patient has macromastia with chronic back pain and it was felt she would benefit from reduction to address this and the patient desires to proceed.   The risks that can be encountered with and after a breast reduction were discussed and include the following but not limited to these: breast asymmetry, fluid accumulation, firmness of the breast, inability to breast feed, loss of nipple or areola, skin loss, decrease or loss in nipple sensation, fat necrosis with death of fat tissue, bleeding, infection, delayed healing, anesthesia risks, skin sensation changes, injury to structures including nerves, blood vessels, and muscles which may be temporary or permanent, allergies to tape, suture materials and glues, blood products, topical preparations or injected agents, skin and breast contour irregularities, skin discoloration and swelling, deep vein thrombosis, cardiac and pulmonary complications, pain, which may persist, persistent pain, recurrence of the lesion, poor healing of the incision, possible need for revisional surgery or staged procedures. A breast reduction can also interfere with certain diagnostic procedures.  Breast and nipple piercing can cause an infection.  This procedure can be performed at any age, but is best done when your breasts are fully developed. Changes in the breasts during pregnancy can alter the outcomes of previous breast reduction surgery, as can significant weight fluctuations.  The patient's questions were  answered and she desires to proceed and consent was obtained.  Prescriptions for Norco, Zofran and Bactrim this visit.

## 2018-04-02 ENCOUNTER — Encounter (HOSPITAL_BASED_OUTPATIENT_CLINIC_OR_DEPARTMENT_OTHER): Payer: Self-pay | Admitting: *Deleted

## 2018-04-02 ENCOUNTER — Ambulatory Visit (HOSPITAL_BASED_OUTPATIENT_CLINIC_OR_DEPARTMENT_OTHER): Payer: Medicare Other | Admitting: Anesthesiology

## 2018-04-02 ENCOUNTER — Ambulatory Visit (HOSPITAL_BASED_OUTPATIENT_CLINIC_OR_DEPARTMENT_OTHER)
Admission: RE | Admit: 2018-04-02 | Discharge: 2018-04-02 | Disposition: A | Discharge status: Home / Self Care | Payer: Medicare Other | Source: Ambulatory Visit | Attending: Plastic Surgery | Admitting: Plastic Surgery

## 2018-04-02 ENCOUNTER — Encounter (HOSPITAL_BASED_OUTPATIENT_CLINIC_OR_DEPARTMENT_OTHER)
Admission: RE | Disposition: A | Discharge status: Home / Self Care | Payer: Self-pay | Source: Ambulatory Visit | Attending: Plastic Surgery

## 2018-04-02 ENCOUNTER — Other Ambulatory Visit: Payer: Self-pay

## 2018-04-02 DIAGNOSIS — Z85038 Personal history of other malignant neoplasm of large intestine: Secondary | ICD-10-CM | POA: Diagnosis not present

## 2018-04-02 DIAGNOSIS — E663 Overweight: Secondary | ICD-10-CM | POA: Insufficient documentation

## 2018-04-02 DIAGNOSIS — E039 Hypothyroidism, unspecified: Secondary | ICD-10-CM | POA: Diagnosis not present

## 2018-04-02 DIAGNOSIS — Z87891 Personal history of nicotine dependence: Secondary | ICD-10-CM | POA: Diagnosis not present

## 2018-04-02 DIAGNOSIS — I10 Essential (primary) hypertension: Secondary | ICD-10-CM | POA: Insufficient documentation

## 2018-04-02 DIAGNOSIS — N62 Hypertrophy of breast: Secondary | ICD-10-CM | POA: Diagnosis present

## 2018-04-02 DIAGNOSIS — D1779 Benign lipomatous neoplasm of other sites: Secondary | ICD-10-CM | POA: Insufficient documentation

## 2018-04-02 DIAGNOSIS — E7800 Pure hypercholesterolemia, unspecified: Secondary | ICD-10-CM | POA: Diagnosis not present

## 2018-04-02 DIAGNOSIS — Z79890 Hormone replacement therapy (postmenopausal): Secondary | ICD-10-CM | POA: Diagnosis not present

## 2018-04-02 DIAGNOSIS — Z6831 Body mass index (BMI) 31.0-31.9, adult: Secondary | ICD-10-CM | POA: Diagnosis not present

## 2018-04-02 DIAGNOSIS — Z79899 Other long term (current) drug therapy: Secondary | ICD-10-CM | POA: Diagnosis not present

## 2018-04-02 HISTORY — PX: BREAST REDUCTION SURGERY: SHX8

## 2018-04-02 HISTORY — DX: Hypothyroidism, unspecified: E03.9

## 2018-04-02 HISTORY — PX: MASS EXCISION: SHX2000

## 2018-04-02 SURGERY — EXCISION MASS
Anesthesia: General | Site: Breast | Laterality: Left

## 2018-04-02 MED ORDER — ROCURONIUM BROMIDE 100 MG/10ML IV SOLN
INTRAVENOUS | Status: DC | PRN
  Administered 2018-04-02: 50 mg via INTRAVENOUS

## 2018-04-02 MED ORDER — ONDANSETRON HCL 4 MG/2ML IJ SOLN: 4.0000 mg | Freq: Once | INTRAMUSCULAR | Status: DC | PRN

## 2018-04-02 MED ORDER — OXYCODONE HCL 5 MG/5ML PO SOLN: 5.0000 mg | Freq: Once | ORAL | Status: DC | PRN

## 2018-04-02 MED ORDER — FENTANYL CITRATE (PF) 100 MCG/2ML IJ SOLN: 25.0000 ug | INTRAMUSCULAR | Status: DC | PRN

## 2018-04-02 MED ORDER — CELECOXIB 200 MG PO CAPS
200.0000 mg | ORAL_CAPSULE | ORAL | Status: AC
  Administered 2018-04-02: 200 mg via ORAL

## 2018-04-02 MED ORDER — OXYCODONE HCL 5 MG PO TABS: 5.0000 mg | ORAL_TABLET | Freq: Once | ORAL | Status: DC | PRN

## 2018-04-02 MED ORDER — HEPARIN SODIUM (PORCINE) 5000 UNIT/ML IJ SOLN
5000.0000 [IU] | Freq: Once | INTRAMUSCULAR | Status: AC
  Administered 2018-04-02: 5000 [IU] via SUBCUTANEOUS

## 2018-04-02 MED ORDER — SCOPOLAMINE 1 MG/3DAYS TD PT72: 1.0000 | MEDICATED_PATCH | Freq: Once | TRANSDERMAL | Status: DC | PRN

## 2018-04-02 MED ORDER — FENTANYL CITRATE (PF) 100 MCG/2ML IJ SOLN
INTRAMUSCULAR | Status: DC | PRN
  Administered 2018-04-02: 25 ug via INTRAVENOUS
  Administered 2018-04-02 (×2): 50 ug via INTRAVENOUS
  Administered 2018-04-02 (×5): 25 ug via INTRAVENOUS

## 2018-04-02 MED ORDER — ACETAMINOPHEN 160 MG/5ML PO SOLN: 325.0000 mg | ORAL | Status: DC | PRN

## 2018-04-02 MED ORDER — FENTANYL CITRATE (PF) 100 MCG/2ML IJ SOLN
INTRAMUSCULAR | Status: AC
  Filled 2018-04-02: qty 2

## 2018-04-02 MED ORDER — ACETAMINOPHEN 500 MG PO TABS
1000.0000 mg | ORAL_TABLET | ORAL | Status: AC
  Administered 2018-04-02: 1000 mg via ORAL

## 2018-04-02 MED ORDER — CHLORHEXIDINE GLUCONATE CLOTH 2 % EX PADS: 6.0000 | MEDICATED_PAD | Freq: Once | CUTANEOUS | Status: DC

## 2018-04-02 MED ORDER — GLYCOPYRROLATE 0.2 MG/ML IJ SOLN
INTRAMUSCULAR | Status: DC | PRN
  Administered 2018-04-02 (×2): 0.1 mg via INTRAVENOUS

## 2018-04-02 MED ORDER — DEXAMETHASONE SODIUM PHOSPHATE 10 MG/ML IJ SOLN
INTRAMUSCULAR | Status: AC
  Filled 2018-04-02: qty 1

## 2018-04-02 MED ORDER — CLINDAMYCIN PHOSPHATE 900 MG/50ML IV SOLN
900.0000 mg | INTRAVENOUS | Status: AC
  Administered 2018-04-02: 900 mg via INTRAVENOUS

## 2018-04-02 MED ORDER — ONDANSETRON HCL 4 MG/2ML IJ SOLN
INTRAMUSCULAR | Status: DC | PRN
  Administered 2018-04-02: 4 mg via INTRAVENOUS

## 2018-04-02 MED ORDER — MEPERIDINE HCL 25 MG/ML IJ SOLN: 6.2500 mg | INTRAMUSCULAR | Status: DC | PRN

## 2018-04-02 MED ORDER — PHENYLEPHRINE HCL 10 MG/ML IJ SOLN
INTRAMUSCULAR | Status: AC
  Filled 2018-04-02: qty 1

## 2018-04-02 MED ORDER — FENTANYL CITRATE (PF) 100 MCG/2ML IJ SOLN: 50.0000 ug | INTRAMUSCULAR | Status: DC | PRN

## 2018-04-02 MED ORDER — LIDOCAINE HCL (CARDIAC) PF 100 MG/5ML IV SOSY
PREFILLED_SYRINGE | INTRAVENOUS | Status: DC | PRN
  Administered 2018-04-02: 100 mg via INTRAVENOUS

## 2018-04-02 MED ORDER — EPHEDRINE SULFATE 50 MG/ML IJ SOLN
INTRAMUSCULAR | Status: AC
  Filled 2018-04-02: qty 1

## 2018-04-02 MED ORDER — PHENYLEPHRINE HCL 10 MG/ML IJ SOLN
INTRAMUSCULAR | Status: DC | PRN
  Administered 2018-04-02: 80 ug via INTRAVENOUS

## 2018-04-02 MED ORDER — CELECOXIB 200 MG PO CAPS
ORAL_CAPSULE | ORAL | Status: AC
  Filled 2018-04-02: qty 1

## 2018-04-02 MED ORDER — BUPIVACAINE LIPOSOME 1.3 % IJ SUSP
INTRAMUSCULAR | Status: AC
Start: 2018-04-02 — End: ?
  Filled 2018-04-02: qty 20

## 2018-04-02 MED ORDER — SODIUM CHLORIDE 0.9 % IV SOLN
INTRAVENOUS | Status: DC | PRN
  Administered 2018-04-02 (×2): 20 mL

## 2018-04-02 MED ORDER — SODIUM CHLORIDE 0.9 % IJ SOLN
INTRAMUSCULAR | Status: AC
  Filled 2018-04-02: qty 10

## 2018-04-02 MED ORDER — MIDAZOLAM HCL 2 MG/2ML IJ SOLN: 1.0000 mg | INTRAMUSCULAR | Status: DC | PRN

## 2018-04-02 MED ORDER — SUGAMMADEX SODIUM 200 MG/2ML IV SOLN
INTRAVENOUS | Status: DC | PRN
  Administered 2018-04-02: 200 mg via INTRAVENOUS

## 2018-04-02 MED ORDER — CLINDAMYCIN PHOSPHATE 900 MG/50ML IV SOLN
INTRAVENOUS | Status: AC
  Filled 2018-04-02: qty 50

## 2018-04-02 MED ORDER — GLYCOPYRROLATE PF 0.2 MG/ML IJ SOSY
PREFILLED_SYRINGE | INTRAMUSCULAR | Status: AC
  Filled 2018-04-02: qty 1

## 2018-04-02 MED ORDER — LIDOCAINE HCL (CARDIAC) PF 100 MG/5ML IV SOSY
PREFILLED_SYRINGE | INTRAVENOUS | Status: AC
  Filled 2018-04-02: qty 5

## 2018-04-02 MED ORDER — SODIUM CHLORIDE 0.9 % IV SOLN
INTRAVENOUS | Status: DC | PRN
  Administered 2018-04-02: 25 ug/min via INTRAVENOUS

## 2018-04-02 MED ORDER — PROPOFOL 500 MG/50ML IV EMUL
INTRAVENOUS | Status: AC
  Filled 2018-04-02: qty 50

## 2018-04-02 MED ORDER — ROCURONIUM BROMIDE 10 MG/ML (PF) SYRINGE
PREFILLED_SYRINGE | INTRAVENOUS | Status: AC
  Filled 2018-04-02: qty 10

## 2018-04-02 MED ORDER — KETOROLAC TROMETHAMINE 60 MG/2ML IM SOLN: 60.0000 mg | Freq: Once | INTRAMUSCULAR | Status: DC

## 2018-04-02 MED ORDER — HEPARIN SODIUM (PORCINE) 5000 UNIT/ML IJ SOLN
INTRAMUSCULAR | Status: AC
  Filled 2018-04-02: qty 1

## 2018-04-02 MED ORDER — GABAPENTIN 300 MG PO CAPS
300.0000 mg | ORAL_CAPSULE | ORAL | Status: AC
  Administered 2018-04-02: 300 mg via ORAL

## 2018-04-02 MED ORDER — GABAPENTIN 300 MG PO CAPS
ORAL_CAPSULE | ORAL | Status: AC
  Filled 2018-04-02: qty 1

## 2018-04-02 MED ORDER — LACTATED RINGERS IV SOLN
INTRAVENOUS | Status: DC
  Administered 2018-04-02 (×2): via INTRAVENOUS

## 2018-04-02 MED ORDER — 0.9 % SODIUM CHLORIDE (POUR BTL) OPTIME
TOPICAL | Status: DC | PRN
  Administered 2018-04-02: 1000 mL

## 2018-04-02 MED ORDER — DEXAMETHASONE SODIUM PHOSPHATE 4 MG/ML IJ SOLN
INTRAMUSCULAR | Status: DC | PRN
  Administered 2018-04-02: 10 mg via INTRAVENOUS

## 2018-04-02 MED ORDER — HEPARIN SODIUM (PORCINE) 5000 UNIT/ML IJ SOLN: 5000.0000 [IU] | INTRAMUSCULAR | Status: DC

## 2018-04-02 MED ORDER — SUGAMMADEX SODIUM 200 MG/2ML IV SOLN
INTRAVENOUS | Status: AC
  Filled 2018-04-02: qty 2

## 2018-04-02 MED ORDER — SODIUM CHLORIDE 0.9 % IJ SOLN
INTRAMUSCULAR | Status: AC
  Filled 2018-04-02: qty 20

## 2018-04-02 MED ORDER — PROPOFOL 10 MG/ML IV BOLUS
INTRAVENOUS | Status: DC | PRN
  Administered 2018-04-02: 20 mg via INTRAVENOUS

## 2018-04-02 MED ORDER — ACETAMINOPHEN 325 MG PO TABS: 325.0000 mg | ORAL_TABLET | ORAL | Status: DC | PRN

## 2018-04-02 MED ORDER — EPHEDRINE SULFATE 50 MG/ML IJ SOLN
INTRAMUSCULAR | Status: DC | PRN
  Administered 2018-04-02: 10 mg via INTRAVENOUS

## 2018-04-02 MED ORDER — ACETAMINOPHEN 500 MG PO TABS
ORAL_TABLET | ORAL | Status: AC
  Filled 2018-04-02: qty 2

## 2018-04-02 MED ORDER — ONDANSETRON HCL 4 MG/2ML IJ SOLN
INTRAMUSCULAR | Status: AC
  Filled 2018-04-02: qty 2

## 2018-04-02 SURGICAL SUPPLY — 49 items
ADH SKN CLS APL DERMABOND .7 (GAUZE/BANDAGES/DRESSINGS) ×8
BINDER BREAST XXLRG (GAUZE/BANDAGES/DRESSINGS) ×2 IMPLANT
BLADE SURG 10 STRL SS (BLADE) ×16 IMPLANT
BNDG GAUZE ELAST 4 BULKY (GAUZE/BANDAGES/DRESSINGS) ×8 IMPLANT
CANISTER SUCT 1200ML W/VALVE (MISCELLANEOUS) ×4 IMPLANT
CHLORAPREP W/TINT 26ML (MISCELLANEOUS) ×6 IMPLANT
COVER BACK TABLE 60X90IN (DRAPES) ×4 IMPLANT
COVER MAYO STAND STRL (DRAPES) ×4 IMPLANT
DERMABOND ADVANCED (GAUZE/BANDAGES/DRESSINGS) ×8
DERMABOND ADVANCED .7 DNX12 (GAUZE/BANDAGES/DRESSINGS) ×2 IMPLANT
DRAIN CHANNEL 15F RND FF W/TCR (WOUND CARE) ×4 IMPLANT
DRAPE TOP ARMCOVERS (MISCELLANEOUS) ×4 IMPLANT
DRAPE U-SHAPE 76X120 STRL (DRAPES) ×4 IMPLANT
DRSG PAD ABDOMINAL 8X10 ST (GAUZE/BANDAGES/DRESSINGS) ×12 IMPLANT
ELECT COATED BLADE 2.86 ST (ELECTRODE) ×4 IMPLANT
ELECT NDL BLADE 2-5/6 (NEEDLE) ×2 IMPLANT
ELECT NEEDLE BLADE 2-5/6 (NEEDLE) ×4 IMPLANT
ELECT REM PT RETURN 9FT ADLT (ELECTROSURGICAL) ×4
ELECTRODE REM PT RTRN 9FT ADLT (ELECTROSURGICAL) ×2 IMPLANT
EVACUATOR SILICONE 100CC (DRAIN) ×4 IMPLANT
GLOVE BIO SURGEON STRL SZ 6 (GLOVE) ×8 IMPLANT
GLOVE BIOGEL PI IND STRL 6.5 (GLOVE) IMPLANT
GLOVE BIOGEL PI INDICATOR 6.5 (GLOVE) ×6
GLOVE ECLIPSE 6.5 STRL STRAW (GLOVE) ×8 IMPLANT
GOWN STRL REUS W/ TWL LRG LVL3 (GOWN DISPOSABLE) ×4 IMPLANT
GOWN STRL REUS W/TWL LRG LVL3 (GOWN DISPOSABLE) ×16
NDL HYPO 25X1 1.5 SAFETY (NEEDLE) IMPLANT
NEEDLE HYPO 25X1 1.5 SAFETY (NEEDLE) ×4 IMPLANT
NS IRRIG 1000ML POUR BTL (IV SOLUTION) ×4 IMPLANT
PACK BASIN DAY SURGERY FS (CUSTOM PROCEDURE TRAY) ×4 IMPLANT
PENCIL BUTTON HOLSTER BLD 10FT (ELECTRODE) ×4 IMPLANT
PIN SAFETY STERILE (MISCELLANEOUS) ×4 IMPLANT
SHEET MEDIUM DRAPE 40X70 STRL (DRAPES) ×4 IMPLANT
SLEEVE SCD COMPRESS KNEE MED (MISCELLANEOUS) ×4 IMPLANT
SPONGE LAP 18X18 RF (DISPOSABLE) ×16 IMPLANT
STAPLER VISISTAT 35W (STAPLE) ×8 IMPLANT
SUT ETHILON 2 0 FS 18 (SUTURE) ×8 IMPLANT
SUT MNCRL AB 4-0 PS2 18 (SUTURE) ×16 IMPLANT
SUT PDS AB 2-0 CT2 27 (SUTURE) IMPLANT
SUT VIC AB 3-0 PS1 18 (SUTURE) ×32
SUT VIC AB 3-0 PS1 18XBRD (SUTURE) ×8 IMPLANT
SUT VICRYL 4-0 PS2 18IN ABS (SUTURE) ×8 IMPLANT
SYR BULB IRRIGATION 50ML (SYRINGE) ×4 IMPLANT
SYR CONTROL 10ML LL (SYRINGE) ×4 IMPLANT
TOWEL GREEN STERILE FF (TOWEL DISPOSABLE) ×8 IMPLANT
TUBE CONNECTING 20'X1/4 (TUBING) ×1
TUBE CONNECTING 20X1/4 (TUBING) ×3 IMPLANT
UNDERPAD 30X30 (UNDERPADS AND DIAPERS) ×8 IMPLANT
YANKAUER SUCT BULB TIP NO VENT (SUCTIONS) ×4 IMPLANT

## 2018-04-02 NOTE — Anesthesia Procedure Notes (Signed)
Procedure Name: Intubation Date/Time: 04/02/2018 7:28 AM Performed by: Marrianne Mood, CRNA Pre-anesthesia Checklist: Patient identified, Emergency Drugs available, Suction available, Patient being monitored and Timeout performed Patient Re-evaluated:Patient Re-evaluated prior to induction Oxygen Delivery Method: Circle system utilized Preoxygenation: Pre-oxygenation with 100% oxygen Induction Type: IV induction Ventilation: Mask ventilation without difficulty Laryngoscope Size: Glidescope, Mac and 3 Grade View: Grade III Tube type: Oral Tube size: 7.0 mm Number of attempts: 1 Airway Equipment and Method: Stylet and Oral airway Placement Confirmation: ETT inserted through vocal cords under direct vision,  positive ETCO2 and breath sounds checked- equal and bilateral Secured at: 20 cm Tube secured with: Tape Dental Injury: Teeth and Oropharynx as per pre-operative assessment

## 2018-04-02 NOTE — Op Note (Signed)
Operative Note   DATE OF OPERATION: 7.16.19  LOCATION: [facility]-outpatient  SURGICAL DIVISION: Plastic Surgery  PREOPERATIVE DIAGNOSES:  1. Macromastia 2. Left breast mass (lipoma) 3. Chronic neck and back pain  POSTOPERATIVE DIAGNOSES:  same  PROCEDURE:  Bilateral breast reduction with excision left breast mass  SURGEON: Irene Limbo MD MBA  ASSISTANT: none  ANESTHESIA:  General.   EBL: 798 ml  COMPLICATIONS: None immediate.   INDICATIONS FOR PROCEDURE:  The patient, Janet Barber, is a 77 y.o. female born on 05-05-1941, is here for breast reduction in setting of macromastia, chronic neck and back pain that has failed conservative measures. She also has a know lipoma of several year duration left lateral breast.   FINDINGS: Left lateral breast lipoma resected, 439 g. Remainder left reduction 675 g, total 1114 g. Right reduction 598 g  DESCRIPTION OF PROCEDURE:  The patient was marked standing in the preoperative area to mark sternal notch, chest midline, anterior axillary lines, inframammary folds. The location of new nipple areolar complex was marked at level of on inframammary fold on anterior surface breast by palpation. This was marked symmetric over bilateral breasts. With aid of Wise pattern marker, location of new nipple areolar complex and vertical limbs (8cm) were marked. The patient was taken to the operating room. SCDs were placed and IV antibiotics were given. The patient's operative site was prepped and draped in a sterile fashion. A time out was performed and all information was confirmed to be correct.   Over left breast, superomedial pedicle marked and nipple areolar complex marked with106mm diameter marker. Pedicle deepithlialized and developedto chest wall. Breast tissue resected over lower pole. Medial and lateral flaps developed.Within lateral breast palpable mass excised >15 cm consistent with lipoma and sent as separate specimen. Breast tailor  tacked closed.  I then directed attention to right breast where superomedial pedicle designed. The pedicle was deepithelialized and developed to 5-6 cm thickness and toward chest wall until tension free closure obtained. Lower pole, lateral chest wall, and superior pole breast tissuee excised. Breast tailor tacked closed, and patient brought to upright sitting position and assessed for symmetry. Patent returned to supine position and breast cavities irrigated and hemostasis obtained. Exparel infiltrated throughout each breast. 15 Fr JP placed in each breast and secured with 2-0 nylon. Closure completed with 3-0 vicryl to approximate dermis along inframammary fold and vertical limb. NAC inset with 4-0 vicryl in dermis. Skin closure completed with 4-0 monocryl subcuticular throughout.Tissue adhesive applied. Dry dressing and breast binder applied.  The patient was allowed to wake from anesthesia, extubated and taken to the recovery room in satisfactory condition.   SPECIMENS: Right breast reduction, left breast reduction, left breast mass  DRAINS: 15 Fr JP in right and left breast  Irene Limbo, MD [facility] Plastic & Reconstructive Surgery [phone], pin [phone]

## 2018-04-02 NOTE — Discharge Instructions (Signed)
Post Anesthesia Home Care Instructions  Activity: Get plenty of rest for the remainder of the day. A responsible individual must stay with you for 24 hours following the procedure.  For the next 24 hours, DO NOT: -Drive a car -Paediatric nurse -Drink alcoholic beverages -Take any medication unless instructed by your physician -Make any legal decisions or sign important papers.  Meals: Start with liquid foods such as gelatin or soup. Progress to regular foods as tolerated. Avoid greasy, spicy, heavy foods. If nausea and/or vomiting occur, drink only clear liquids until the nausea and/or vomiting subsides. Call your physician if vomiting continues.  Special Instructions/Symptoms: Your throat may feel dry or sore from the anesthesia or the breathing tube placed in your throat during surgery. If this causes discomfort, gargle with warm salt water. The discomfort should disappear within 24 hours.  If you had a scopolamine patch placed behind your ear for the management of post- operative nausea and/or vomiting:  1. The medication in the patch is effective for 72 hours, after which it should be removed.  Wrap patch in a tissue and discard in the trash. Wash hands thoroughly with soap and water. 2. You may remove the patch earlier than 72 hours if you experience unpleasant side effects which may include dry mouth, dizziness or visual disturbances. 3. Avoid touching the patch. Wash your hands with soap and water after contact with the patch.     Information for Discharge Teaching: EXPAREL (bupivacaine liposome injectable suspension)   Your surgeon gave you EXPAREL(bupivacaine) in your surgical incision to help control your pain after surgery.   EXPAREL is a local anesthetic that provides pain relief by numbing the tissue around the surgical site.  EXPAREL is designed to release pain medication over time and can control pain for up to 72 hours.  Depending on how you respond to EXPAREL,  you may require less pain medication during your recovery.  Possible side effects:  Temporary loss of sensation or ability to move in the area where bupivacaine was injected.  Nausea, vomiting, constipation  Rarely, numbness and tingling in your mouth or lips, lightheadedness, or anxiety may occur.  Call your doctor right away if you think you may be experiencing any of these sensations, or if you have other questions regarding possible side effects.  Follow all other discharge instructions given to you by your surgeon or nurse. Eat a healthy diet and drink plenty of water or other fluids.  If you return to the hospital for any reason within 96 hours following the administration of EXPAREL, please inform your health care providers.      JP Drain [employer] this sheet to all of your post-operative appointments while you have your drains.  Please measure your drains by CC's or ML's.  Make sure you drain and measure your JP Drains 2 or 3 times per day.  At the end of each day, add up totals for the left side and add up totals for the right side.    ( 9 am )     ( 3 pm )        ( 9 pm )                Date L  R  L  R  L  R  Total L/R  About my Jackson-Pratt Bulb Drain  What is a Jackson-Pratt bulb? A Jackson-Pratt is a soft, round device used to collect drainage. It is connected to a long, thin drainage catheter, which is held in place by one or two small stiches near your surgical incision site. When the bulb is squeezed, it forms a vacuum, forcing the drainage to empty into the bulb.  Emptying the Jackson-Pratt bulb- To empty the bulb: 1. Release the plug on the top of the bulb. 2. Pour the bulb's contents into a measuring container which your nurse will provide. 3. Record the time  emptied and amount of drainage. Empty the drain(s) as often as your     doctor or nurse recommends.  Date                  Time                    Amount (Drain 1)                 Amount (Drain 2)  _____________________________________________________________________  _____________________________________________________________________  _____________________________________________________________________  _____________________________________________________________________  _____________________________________________________________________  _____________________________________________________________________  _____________________________________________________________________  _____________________________________________________________________  Squeezing the Jackson-Pratt Bulb- To squeeze the bulb: 1. Make sure the plug at the top of the bulb is open. 2. Squeeze the bulb tightly in your fist. You will hear air squeezing from the bulb. 3. Replace the plug while the bulb is squeezed. 4. Use a safety pin to attach the bulb to your clothing. This will keep the catheter from     pulling at the bulb insertion site.  When to call your doctor- Call your doctor if:  Drain site becomes red, swollen or hot.  You have a fever greater than 101 degrees F.  There is oozing at the drain site.  Drain falls out (apply a guaze bandage over the drain hole and secure it with tape).  Drainage increases daily not related to activity patterns. (You will usually have more drainage when you are active than when you are resting.)  Drainage has a bad odor.    SACRAL DRESSING (Lower Back)   A pressure ulcer is a sore where the skin breaks open   This dressing will be placed on your lower back to protect this area from pressure and moisture and in many cases helps prevent pressure ulcers from forming   A nurse may place this dressing before your surgery or another procedure   A  nurse may also place this dressing if you have other conditions that put you at risk for developing a pressure ulcer   If you are getting up and moving around after surgery, the dressing may be taken off with your first shower. Simply remove it and throw it away.   While you are in the hospital, nurses will change the dressing twice a week as long as you are still at risk for developing a pressure ulcer   This dressing is latex free and made with silicone (for adhesive sensitivity) so it is safe and gentle to the skin

## 2018-04-02 NOTE — Transfer of Care (Signed)
Immediate Anesthesia Transfer of Care Note  Patient: Janet Barber  Procedure(s) Performed: EXCISION LEFT BREAST MASS (Left Breast) MAMMARY REDUCTION  (BREAST) (Bilateral Breast)  Patient Location: PACU  Anesthesia Type:General  Level of Consciousness: drowsy and patient cooperative  Airway & Oxygen Therapy: Patient Spontanous Breathing and Patient connected to face mask oxygen  Post-op Assessment: Report given to RN and Post -op Vital signs reviewed and stable  Post vital signs: Reviewed and stable  Last Vitals:  Vitals Value Taken Time  BP    Temp    Pulse    Resp    SpO2      Last Pain:  Vitals:   04/02/18 0637  TempSrc: Oral  PainSc: 0-No pain         Complications: No apparent anesthesia complications

## 2018-04-02 NOTE — Anesthesia Preprocedure Evaluation (Signed)
Anesthesia Evaluation  Patient identified by MRN, date of birth, ID band Patient awake    Reviewed: Allergy & Precautions, NPO status , Patient's Chart, lab work & pertinent test results  Airway Mallampati: I       Dental no notable dental hx. (+) Teeth Intact   Pulmonary neg pulmonary ROS, former smoker,    Pulmonary exam normal breath sounds clear to auscultation       Cardiovascular hypertension, Pt. on medications Normal cardiovascular exam Rhythm:Regular Rate:Normal     Neuro/Psych    GI/Hepatic negative GI ROS, Neg liver ROS,   Endo/Other  Hypothyroidism   Renal/GU negative Renal ROS  negative genitourinary   Musculoskeletal   Abdominal (+) + obese,   Peds  Hematology negative hematology ROS (+)   Anesthesia Other Findings   Reproductive/Obstetrics negative OB ROS                             Anesthesia Physical Anesthesia Plan  ASA: II  Anesthesia Plan: General   Post-op Pain Management:    Induction: Intravenous  PONV Risk Score and Plan: 4 or greater and Ondansetron and Dexamethasone  Airway Management Planned: Oral ETT  Additional Equipment:   Intra-op Plan:   Post-operative Plan: Extubation in OR  Informed Consent: I have reviewed the patients History and Physical, chart, labs and discussed the procedure including the risks, benefits and alternatives for the proposed anesthesia with the patient or authorized representative who has indicated his/her understanding and acceptance.   Dental advisory given  Plan Discussed with: CRNA and Surgeon  Anesthesia Plan Comments:         Anesthesia Quick Evaluation

## 2018-04-02 NOTE — Interval H&P Note (Signed)
History and Physical Interval Note:  04/02/2018 6:55 AM  Janet Barber  has presented today for surgery, with the diagnosis of left breast mass, macromastia  The various methods of treatment have been discussed with the patient and family. After consideration of risks, benefits and other options for treatment, the patient has consented to  Procedure(s): EXCISION LEFT BREAST MASS (Left) MAMMARY REDUCTION  (BREAST) (Bilateral) as a surgical intervention .  The patient's history has been reviewed, patient examined, no change in status, stable for surgery.  I have reviewed the patient's chart and labs.  Questions were answered to the patient's satisfaction.     Damoni Erker

## 2018-04-02 NOTE — Anesthesia Postprocedure Evaluation (Signed)
Anesthesia Post Note  Patient: Janet Barber  Procedure(s) Performed: EXCISION LEFT BREAST MASS (Left Breast) MAMMARY REDUCTION  (BREAST) (Bilateral Breast)     Patient location during evaluation: PACU Anesthesia Type: General Level of consciousness: sedated Pain management: pain level controlled Vital Signs Assessment: post-procedure vital signs reviewed and stable Respiratory status: spontaneous breathing Cardiovascular status: stable Postop Assessment: no apparent nausea or vomiting Anesthetic complications: no    Last Vitals:  Vitals:   04/02/18 1237 04/02/18 1245  BP: (!) 166/92 (!) 159/100  Pulse: (!) 115 (!) 114  Resp: (!) 21 (!) 24  Temp:    SpO2: 93% 93%    Last Pain:  Vitals:   04/02/18 1245  TempSrc:   PainSc: 0-No pain   Pain Goal:                 Ellyssa Zagal JR,JOHN Janera Peugh

## 2018-04-04 ENCOUNTER — Encounter (HOSPITAL_BASED_OUTPATIENT_CLINIC_OR_DEPARTMENT_OTHER): Payer: Self-pay | Admitting: Plastic Surgery

## 2018-07-31 ENCOUNTER — Telehealth: Payer: Self-pay | Admitting: Nurse Practitioner

## 2018-07-31 NOTE — Telephone Encounter (Signed)
The patient called me back and rescheduled to 11/26.  I explained that she will only see our nurse Clarise Cruz that day for her yearly Medicare questionnaire. VDM (DD)

## 2018-07-31 NOTE — Telephone Encounter (Signed)
I left a message asking the pt to call me at [phone] to reschedule AWV from 09/19/2018 to 08/13/18 or before the end of 2019. VDM (DD)

## 2018-08-13 ENCOUNTER — Ambulatory Visit (INDEPENDENT_AMBULATORY_CARE_PROVIDER_SITE_OTHER): Payer: Medicare Other

## 2018-08-13 VITALS — BP 128/70 | HR 92 | Temp 98.1°F | Ht 64.0 in | Wt 185.0 lb

## 2018-08-13 DIAGNOSIS — Z0000 Encounter for general adult medical examination without abnormal findings: Secondary | ICD-10-CM | POA: Diagnosis not present

## 2018-08-13 MED ORDER — PNEUMOCOCCAL 13-VAL CONJ VACC IM SUSP: 0.5000 mL | INTRAMUSCULAR | 0 refills | Status: AC

## 2018-08-13 NOTE — Progress Notes (Signed)
Subjective:   Janet Barber is a 77 y.o. female who presents for Medicare Annual (Subsequent) preventive examination.     Objective:     Vitals: BP 128/70 (BP Location: Left Arm, Patient Position: Sitting)   Pulse 92   Temp 98.1 F (36.7 C) (Oral)   Ht 5\' 4"  (1.626 m)   Wt 185 lb (83.9 kg)   SpO2 96%   BMI 31.76 kg/m   Body mass index is 31.76 kg/m.  Advanced Directives 08/13/2018 04/02/2018 03/26/2018 10/03/2016  Does Patient Have a Medical Advance Directive? No No No No  Would patient like information on creating a medical advance directive? No - Patient declined No - Patient declined - No - Patient declined    Tobacco Social History   Tobacco Use  Smoking Status Former Smoker  . Types: Cigarettes  . Last attempt to quit: 09/18/1986  . Years since quitting: 31.9  Smokeless Tobacco Never Used     Counseling given: Not Answered   Clinical Intake:  Pre-visit preparation completed: No  Pain : No/denies pain     Diabetes: No  How often do you need to have someone help you when you read instructions, pamphlets, or other written materials from your doctor or pharmacy?: 1 - Never What is the last grade level you completed in school?: college  Interpreter Needed?: No  Information entered by :: Tyson Dense, RN  Past Medical History:  Diagnosis Date  . Allergic rhinitis   . Anxiety   . Colon cancer ([place])   . Diverticulosis of colon   . History of hemorrhoids   . Hx of colonic polyps   . Hypercholesterolemia   . Hypertension   . Hypothyroidism   . Lumbar back pain   . Mitral valve prolapse   . Overweight(278.02)   . Venous insufficiency   . Vitamin D deficiency    Past Surgical History:  Procedure Laterality Date  . ABDOMINAL HYSTERECTOMY  1974  . benign breast biopsy  2006  . BREAST REDUCTION SURGERY Bilateral 04/02/2018   Procedure: MAMMARY REDUCTION  (BREAST);  Surgeon: Irene Limbo, MD;  Location: [place];  Service:  Plastics;  Laterality: Bilateral;  . MASS EXCISION Left 04/02/2018   Procedure: EXCISION LEFT BREAST MASS;  Surgeon: Irene Limbo, MD;  Location: [place];  Service: Plastics;  Laterality: Left;  . [facility] [facility]   History reviewed. No pertinent family history. Social History   Socioeconomic History  . Marital status: Widowed    Spouse name: Not on file  . Number of children: 2  . Years of education: Not on file  . Highest education level: Not on file  Occupational History  . Not on file  Social Needs  . Financial resource strain: Not hard at all  . Food insecurity:    Worry: Never true    Inability: Never true  . Transportation needs:    Medical: No    Non-medical: No  Tobacco Use  . Smoking status: Former Smoker    Types: Cigarettes    Last attempt to quit: 09/18/1986    Years since quitting: 31.9  . Smokeless tobacco: Never Used  Substance and Sexual Activity  . Alcohol use: No  . Drug use: No  . Sexual activity: Not on file  Lifestyle  . Physical activity:    Days per week: 3 days    Minutes per session: 30 min  . Stress: Not at all  Relationships  .  Social connections:    Talks on phone: More than three times a week    Gets together: More than three times a week    Attends religious service: More than 4 times per year    Active member of club or organization: No    Attends meetings of clubs or organizations: Never    Relationship status: Widowed  Other Topics Concern  . Not on file  Social History Narrative  . Not on file    Outpatient Encounter Medications as of 08/13/2018  Medication Sig  . aspirin 81 MG chewable tablet Chew 81 mg by mouth daily.  Marland Kitchen atorvastatin (LIPITOR) 20 MG tablet Take 10 mg by mouth daily.   Marland Kitchen levothyroxine (SYNTHROID, LEVOTHROID) 25 MCG tablet Take 25 mcg by mouth daily.  . NON FORMULARY Allergy shot 1 time a month  . pneumococcal 13-valent conjugate vaccine (PREVNAR 13) SUSP injection Inject  0.5 mLs into the muscle tomorrow at 10 am for 1 dose.  . valsartan-hydrochlorothiazide (DIOVAN-HCT) 320-25 MG tablet Take 1 tablet by mouth daily.  . vitamin B-12 (CYANOCOBALAMIN) 250 MCG tablet Take 250 mcg by mouth daily.  . [DISCONTINUED] pneumococcal 13-valent conjugate vaccine (PREVNAR 13) SUSP injection Inject 0.5 mLs into the muscle tomorrow at 10 am.   No facility-administered encounter medications on file as of 08/13/2018.     Activities of Daily Living In your present state of health, do you have any difficulty performing the following activities: 08/13/2018 04/02/2018  Hearing? N N  Vision? N N  Difficulty concentrating or making decisions? N N  Walking or climbing stairs? N N  Dressing or bathing? N N  Doing errands, shopping? N -  Preparing Food and eating ? N -  Using the Toilet? N -  In the past six months, have you accidently leaked urine? N -  Do you have problems with loss of bowel control? N -  Managing your Medications? N -  Managing your Finances? N -  Housekeeping or managing your Housekeeping? N -  Some recent data might be hidden    Patient Care Team: [name], FNP as PCP - General (General Practice)    Assessment:   This is a routine wellness examination for Janet Barber.  Exercise Activities and Dietary recommendations Current Exercise Habits: Home exercise routine, Type of exercise: walking, Time (Minutes): 30, Frequency (Times/Week): 3, Weekly Exercise (Minutes/Week): 90, Intensity: Mild, Exercise limited by: None identified  Goals   None     Fall Risk Fall Risk  08/13/2018  Falls in the past year? 0  Number falls in past yr: 0  Injury with Fall? 0   Is the patient's home free of loose throw rugs in walkways, pet beds, electrical cords, etc?   yes      Grab bars in the bathroom? no      Handrails on the stairs?   yes      Adequate lighting?   yes  Depression Screen PHQ 2/9 Scores 08/13/2018  PHQ - 2 Score 0     Cognitive Function       6CIT Screen 08/13/2018  What Year? 0 points  What month? 0 points  What time? 0 points  Count back from 20 0 points  Months in reverse 0 points  Repeat phrase 6 points  Total Score 6    Immunization History  Administered Date(s) Administered  . Influenza Whole 06/03/2008, 07/08/2009  . Influenza-Unspecified 07/18/2011, 06/18/2018  . Pneumococcal Polysaccharide-23 06/09/2009  . Tdap 05/06/2013  Qualifies for Shingles Vaccine? Yes, educated and wants to wait  Screening Tests Health Maintenance  Topic Date Due  . DEXA SCAN  11/22/2006  . PNA vac Low Risk Adult (2 of 2 - PCV13) 06/09/2010  . TETANUS/TDAP  05/07/2023  . INFLUENZA VACCINE  Completed    Cancer Screenings: Lung: Low Dose CT Chest recommended if Age 10-80 years, 30 pack-year currently smoking OR have quit w/in 15years. Patient does not qualify. Breast:  Up to date on Mammogram? Yes   Up to date of Bone Density/Dexa? Yes Colorectal: up to date  Additional Screenings:  Hepatitis C Screening: declined Prevnar due: ordered to pharmacy     Plan:    I have personally reviewed and addressed the Medicare Annual Wellness questionnaire and have noted the following in the patient's chart:  A. Medical and social history B. Use of alcohol, tobacco or illicit drugs  C. Current medications and supplements D. Functional ability and status E.  Nutritional status F.  Physical activity G. Advance directives H. List of other physicians I.  Hospitalizations, surgeries, and ER visits in previous 12 months J.  [place] to include hearing, vision, cognitive, depression L. Referrals and appointments - none  In addition, I have reviewed and discussed with patient certain preventive protocols, quality metrics, and best practice recommendations. A written personalized care plan for preventive services as well as general preventive health recommendations were provided to patient.  See attached scanned questionnaire  for additional information.   Signed,   Tyson Dense, RN Nurse Health Advisor  Patient concerns: None

## 2018-08-13 NOTE — Patient Instructions (Addendum)
[name] , Thank you for taking time to come for your Medicare Wellness Visit. I appreciate your ongoing commitment to your health goals. Please review the following plan we discussed and let me know if I can assist you in the future.   Screening recommendations/referrals: Colonoscopy excluded, over age 76 Mammogram excluded, over age 5 Bone Density up to date Recommended yearly ophthalmology/optometry visit for glaucoma screening and checkup Recommended yearly dental visit for hygiene and checkup  Vaccinations: Influenza vaccine up to date Pneumococcal vaccine due, prescription sent to pharmacy Tdap vaccine up to date, due 05/07/2023 Shingles vaccine due, please get at pharmacy    Advanced directives: Please fill out and bring [place] a copy  Conditions/risks identified: none  Next appointment: Please schedule Medicare Wellness visit for Nov 2020   Preventive Care 65 Years and Older, Female Preventive care refers to lifestyle choices and visits with your health care provider that can promote health and wellness. What does preventive care include?  A yearly physical exam. This is also called an annual well check.  Dental exams once or twice a year.  Routine eye exams. Ask your health care provider how often you should have your eyes checked.  Personal lifestyle choices, including:  Daily care of your teeth and gums.  Regular physical activity.  Eating a healthy diet.  Avoiding tobacco and drug use.  Limiting alcohol use.  Practicing safe sex.  Taking low-dose aspirin every day.  Taking vitamin and mineral supplements as recommended by your health care provider. What happens during an annual well check? The services and screenings done by your health care provider during your annual well check will depend on your age, overall health, lifestyle risk factors, and family history of disease. Counseling  Your health care provider may ask you questions about your:  Alcohol  use.  Tobacco use.  Drug use.  Emotional well-being.  Home and relationship well-being.  Sexual activity.  Eating habits.  History of falls.  Memory and ability to understand (cognition).  Work and work Statistician.  Reproductive health. Screening  You may have the following tests or measurements:  Height, weight, and BMI.  Blood pressure.  Lipid and cholesterol levels. These may be checked every 5 years, or more frequently if you are over 1 years old.  Skin check.  Lung cancer screening. You may have this screening every year starting at age 32 if you have a 30-pack-year history of smoking and currently smoke or have quit within the past 15 years.  Fecal occult blood test (FOBT) of the stool. You may have this test every year starting at age 21.  Flexible sigmoidoscopy or colonoscopy. You may have a sigmoidoscopy every 5 years or a colonoscopy every 10 years starting at age 93.  Hepatitis C blood test.  Hepatitis B blood test.  Sexually transmitted disease (STD) testing.  Diabetes screening. This is done by checking your blood sugar (glucose) after you have not eaten for a while (fasting). You may have this done every 1-3 years.  Bone density scan. This is done to screen for osteoporosis. You may have this done starting at age 59.  Mammogram. This may be done every 1-2 years. Talk to your health care provider about how often you should have regular mammograms. Talk with your health care provider about your test results, treatment options, and if necessary, the need for more tests. Vaccines  Your health care provider may recommend certain vaccines, such as:  Influenza vaccine. This is recommended every  year.  Tetanus, diphtheria, and acellular pertussis (Tdap, Td) vaccine. You may need a Td booster every 10 years.  Zoster vaccine. You may need this after age 61.  Pneumococcal 13-valent conjugate (PCV13) vaccine. One dose is recommended after age  11.  Pneumococcal polysaccharide (PPSV23) vaccine. One dose is recommended after age 51. Talk to your health care provider about which screenings and vaccines you need and how often you need them. This information is not intended to replace advice given to you by your health care provider. Make sure you discuss any questions you have with your health care provider. Document Released: 10/01/2015 Document Revised: 05/24/2016 Document Reviewed: 07/06/2015 Elsevier Interactive Patient Education  2017 [place] Prevention in the Home Falls can cause injuries. They can happen to people of all ages. There are many things you can do to make your home safe and to help prevent falls. What can I do on the outside of my home?  Regularly fix the edges of walkways and driveways and fix any cracks.  Remove anything that might make you trip as you walk through a door, such as a raised step or threshold.  Trim any bushes or trees on the path to your home.  Use bright outdoor lighting.  Clear any walking paths of anything that might make someone trip, such as rocks or tools.  Regularly check to see if handrails are loose or broken. Make sure that both sides of any steps have handrails.  Any raised decks and porches should have guardrails on the edges.  Have any leaves, snow, or ice cleared regularly.  Use sand or salt on walking paths during winter.  Clean up any spills in your garage right away. This includes oil or grease spills. What can I do in the bathroom?  Use night lights.  Install grab bars by the toilet and in the tub and shower. Do not use towel bars as grab bars.  Use non-skid mats or decals in the tub or shower.  If you need to sit down in the shower, use a plastic, non-slip stool.  Keep the floor dry. Clean up any water that spills on the floor as soon as it happens.  Remove soap buildup in the tub or shower regularly.  Attach bath mats securely with double-sided  non-slip rug tape.  Do not have throw rugs and other things on the floor that can make you trip. What can I do in the bedroom?  Use night lights.  Make sure that you have a light by your bed that is easy to reach.  Do not use any sheets or blankets that are too big for your bed. They should not hang down onto the floor.  Have a firm chair that has side arms. You can use this for support while you get dressed.  Do not have throw rugs and other things on the floor that can make you trip. What can I do in the kitchen?  Clean up any spills right away.  Avoid walking on wet floors.  Keep items that you use a lot in easy-to-reach places.  If you need to reach something above you, use a strong step stool that has a grab bar.  Keep electrical cords out of the way.  Do not use floor polish or wax that makes floors slippery. If you must use wax, use non-skid floor wax.  Do not have throw rugs and other things on the floor that can make you trip. What can  I do with my stairs?  Do not leave any items on the stairs.  Make sure that there are handrails on both sides of the stairs and use them. Fix handrails that are broken or loose. Make sure that handrails are as long as the stairways.  Check any carpeting to make sure that it is firmly attached to the stairs. Fix any carpet that is loose or worn.  Avoid having throw rugs at the top or bottom of the stairs. If you do have throw rugs, attach them to the floor with carpet tape.  Make sure that you have a light switch at the top of the stairs and the bottom of the stairs. If you do not have them, ask someone to add them for you. What else can I do to help prevent falls?  Wear shoes that:  Do not have high heels.  Have rubber bottoms.  Are comfortable and fit you well.  Are closed at the toe. Do not wear sandals.  If you use a stepladder:  Make sure that it is fully opened. Do not climb a closed stepladder.  Make sure that both  sides of the stepladder are locked into place.  Ask someone to hold it for you, if possible.  Clearly mark and make sure that you can see:  Any grab bars or handrails.  First and last steps.  Where the edge of each step is.  Use tools that help you move around (mobility aids) if they are needed. These include:  Canes.  Walkers.  Scooters.  Crutches.  Turn on the lights when you go into a dark area. Replace any light bulbs as soon as they burn out.  Set up your furniture so you have a clear path. Avoid moving your furniture around.  If any of your floors are uneven, fix them.  If there are any pets around you, be aware of where they are.  Review your medicines with your doctor. Some medicines can make you feel dizzy. This can increase your chance of falling. Ask your doctor what other things that you can do to help prevent falls. This information is not intended to replace advice given to you by your health care provider. Make sure you discuss any questions you have with your health care provider. Document Released: 07/01/2009 Document Revised: 02/10/2016 Document Reviewed: 10/09/2014 Elsevier Interactive Patient Education  2017 [employer].

## 2018-09-19 ENCOUNTER — Ambulatory Visit: Payer: Self-pay | Admitting: Nurse Practitioner

## 2018-11-27 ENCOUNTER — Other Ambulatory Visit: Payer: Self-pay | Admitting: Nurse Practitioner

## 2018-12-05 ENCOUNTER — Other Ambulatory Visit: Payer: Self-pay | Admitting: Nurse Practitioner

## 2018-12-12 ENCOUNTER — Other Ambulatory Visit: Payer: Self-pay | Admitting: Nurse Practitioner

## 2018-12-16 ENCOUNTER — Other Ambulatory Visit: Payer: Self-pay

## 2018-12-16 ENCOUNTER — Encounter: Payer: Self-pay | Admitting: Nurse Practitioner

## 2018-12-16 ENCOUNTER — Ambulatory Visit (INDEPENDENT_AMBULATORY_CARE_PROVIDER_SITE_OTHER): Payer: Medicare Other | Admitting: Nurse Practitioner

## 2018-12-16 VITALS — BP 144/88 | HR 78 | Temp 98.4°F | Ht 59.4 in | Wt 189.0 lb

## 2018-12-16 DIAGNOSIS — E559 Vitamin D deficiency, unspecified: Secondary | ICD-10-CM

## 2018-12-16 DIAGNOSIS — E782 Mixed hyperlipidemia: Secondary | ICD-10-CM | POA: Diagnosis not present

## 2018-12-16 DIAGNOSIS — I10 Essential (primary) hypertension: Secondary | ICD-10-CM

## 2018-12-16 DIAGNOSIS — E2839 Other primary ovarian failure: Secondary | ICD-10-CM

## 2018-12-16 DIAGNOSIS — E039 Hypothyroidism, unspecified: Secondary | ICD-10-CM | POA: Diagnosis not present

## 2018-12-16 DIAGNOSIS — E7800 Pure hypercholesterolemia, unspecified: Secondary | ICD-10-CM

## 2018-12-16 DIAGNOSIS — Z85038 Personal history of other malignant neoplasm of large intestine: Secondary | ICD-10-CM

## 2018-12-16 DIAGNOSIS — E079 Disorder of thyroid, unspecified: Secondary | ICD-10-CM

## 2018-12-16 MED ORDER — ATORVASTATIN CALCIUM 10 MG PO TABS
10.0000 mg | ORAL_TABLET | Freq: Every day | ORAL | 0 refills | Status: DC
Start: 2018-12-16 — End: 2019-03-14

## 2018-12-16 MED ORDER — LEVOTHYROXINE SODIUM 25 MCG PO TABS: 25.0000 ug | ORAL_TABLET | Freq: Every day | ORAL | 0 refills | Status: DC

## 2018-12-16 MED ORDER — VALSARTAN-HYDROCHLOROTHIAZIDE 320-25 MG PO TABS: 1.0000 | ORAL_TABLET | Freq: Every day | ORAL | 0 refills | Status: DC

## 2018-12-16 NOTE — Progress Notes (Signed)
Subjective:     Patient ID: Janet Barber , female    DOB: 24-Feb-1941 , 78 y.o.   MRN: [id number]   Chief Complaint  Patient presents with  . Hypertension    HPI  Hypertension  This is a chronic problem. The current episode started more than 1 year ago. The problem has been gradually worsening (no blood pressure medication since yesterday) since onset. The problem is uncontrolled (elevated today relates to being upset). Pertinent negatives include no chest pain, headaches or palpitations. There are no associated agents to hypertension. There are no known risk factors for coronary artery disease.     Past Medical History:  Diagnosis Date  . Allergic rhinitis   . Anxiety   . Colon cancer ([place])   . Diverticulosis of colon   . History of hemorrhoids   . Hx of colonic polyps   . Hypercholesterolemia   . Hypertension   . Hypothyroidism   . Lumbar back pain   . Mitral valve prolapse   . Overweight(278.02)   . Venous insufficiency   . Vitamin D deficiency      No family history on file.   Current Outpatient Medications:  .  aspirin 81 MG chewable tablet, Chew 81 mg by mouth daily., Disp: , Rfl:  .  NON FORMULARY, Allergy shot 1 time a month, Disp: , Rfl:  .  vitamin B-12 (CYANOCOBALAMIN) 250 MCG tablet, Take 250 mcg by mouth daily., Disp: , Rfl:  .  atorvastatin (LIPITOR) 10 MG tablet, Take 1 tablet (10 mg total) by mouth daily., Disp: 90 tablet, Rfl: 0 .  atorvastatin (LIPITOR) 20 MG tablet, Take 10 mg by mouth daily. , Disp: , Rfl:  .  levothyroxine (SYNTHROID, LEVOTHROID) 25 MCG tablet, Take 1 tablet (25 mcg total) by mouth daily., Disp: 90 tablet, Rfl: 0 .  valsartan-hydrochlorothiazide (DIOVAN-HCT) 320-25 MG tablet, Take 1 tablet by mouth daily., Disp: 90 tablet, Rfl: 0   Allergies  Allergen Reactions  . Shellfish Allergy Itching  . Penicillins     REACTION: unsure of reaction---years ago     Review of Systems  Constitutional: Negative.  Negative for fatigue.   Eyes: Negative for visual disturbance.  Respiratory: Negative.  Negative for cough.   Cardiovascular: Negative.  Negative for chest pain, palpitations and leg swelling.  Gastrointestinal: Negative.   Endocrine: Negative.   Skin: Negative.   Neurological: Negative for dizziness and headaches.  Psychiatric/Behavioral: Negative for confusion. The patient is not nervous/anxious.      Today's Vitals   12/16/18 0958  BP: (!) 144/88  Pulse: 78  Temp: 98.4 F (36.9 C)  TempSrc: Oral  SpO2: 95%  Weight: 189 lb (85.7 kg)  Height: 4' 11.4" (1.509 m)   Body mass index is 37.66 kg/m.   Objective:  Physical Exam Vitals signs reviewed.  Constitutional:      Appearance: She is well-developed.  HENT:     Head: Normocephalic and atraumatic.  Eyes:     Pupils: Pupils are equal, round, and reactive to light.  Cardiovascular:     Rate and Rhythm: Normal rate and regular rhythm.     Pulses: Normal pulses.     Heart sounds: Normal heart sounds. No murmur.  Pulmonary:     Effort: Pulmonary effort is normal.     Breath sounds: Normal breath sounds.  Musculoskeletal: Normal range of motion.  Skin:    General: Skin is warm and dry.     Capillary Refill: Capillary refill takes less than  2 seconds.  Neurological:     General: No focal deficit present.     Mental Status: She is alert and oriented to person, place, and time.     Cranial Nerves: No cranial nerve deficit.  Psychiatric:        Mood and Affect: Mood normal.        Behavior: Behavior normal.        Thought Content: Thought content normal.      Assessment And Plan:     1. Mixed hyperlipidemia  Chronic, controlled  Continue with current medications  Will check lipid panel - Lipid panel  2. Essential hypertension . B/P is elevated today and uncontrolled she relates this to being "upset" about her medication refill.  . CMP ordered to check renal function.  . The importance of regular exercise and dietary modification  was stressed to the patient.  . Stressed importance of losing ten percent of her body weight to help with B/P control.  . The weight loss would help with decreasing cardiac and cancer risk as well.  . I advised her she needs a follow up at least once every 3 months due to her chronic illnesses. .  - CMP14 + Anion Gap  3. Hypothyroidism, unspecified type  Chronic, controlled  Continue with current medications  Will check thyroid levels  - TSH - T4 - T3, free  4. Vitamin D deficiency  Chronic, controlled  She is not taking any medications  Will check vitamin D level and supplement as needed.     Also encouraged to spend 15 minutes in the sun daily.  - Vitamin D (25 hydroxy)  5. History of colon cancer  Had tumors removed in 2008, she is cancer free at this time  6. Decreased estrogen level  Will check vitamin d level as well - DG Bone Density; Future      [name], FNP

## 2018-12-17 ENCOUNTER — Telehealth: Payer: Self-pay

## 2018-12-17 ENCOUNTER — Other Ambulatory Visit: Payer: Self-pay | Admitting: Nurse Practitioner

## 2018-12-17 DIAGNOSIS — E559 Vitamin D deficiency, unspecified: Secondary | ICD-10-CM

## 2018-12-17 LAB — VITAMIN D 25 HYDROXY (VIT D DEFICIENCY, FRACTURES): Vit D, 25-Hydroxy: 19.8 ng/mL — ABNORMAL LOW (ref 30.0–100.0)

## 2018-12-17 LAB — LIPID PANEL
CHOLESTEROL TOTAL: 192 mg/dL (ref 100–199)
Chol/HDL Ratio: 3.3 ratio (ref 0.0–4.4)
HDL: 58 mg/dL (ref >39)
LDL Calculated: 122 mg/dL — ABNORMAL HIGH (ref 0–99)
Triglycerides: 60 mg/dL (ref 0–149)
VLDL Cholesterol Cal: 12 mg/dL (ref 5–40)

## 2018-12-17 LAB — CMP14 + ANION GAP
A/G RATIO: 1.5 (ref 1.2–2.2)
ALT: 14 IU/L (ref 0–32)
AST: 19 IU/L (ref 0–40)
Albumin: 4 g/dL (ref 3.7–4.7)
Alkaline Phosphatase: 88 IU/L (ref 39–117)
Anion Gap: 14 mmol/L (ref 10.0–18.0)
BILIRUBIN TOTAL: 0.7 mg/dL (ref 0.0–1.2)
BUN/Creatinine Ratio: 14 (ref 12–28)
BUN: 15 mg/dL (ref 8–27)
CHLORIDE: 105 mmol/L (ref 96–106)
CO2: 25 mmol/L (ref 20–29)
Calcium: 9.5 mg/dL (ref 8.7–10.3)
Creatinine, Ser: 1.06 mg/dL — ABNORMAL HIGH (ref 0.57–1.00)
GFR calc Af Amer: 59 mL/min/{1.73_m2} — ABNORMAL LOW (ref >59)
GFR calc non Af Amer: 51 mL/min/{1.73_m2} — ABNORMAL LOW (ref >59)
GLUCOSE: 92 mg/dL (ref 65–99)
Globulin, Total: 2.7 g/dL (ref 1.5–4.5)
POTASSIUM: 3.9 mmol/L (ref 3.5–5.2)
Sodium: 144 mmol/L (ref 134–144)
Total Protein: 6.7 g/dL (ref 6.0–8.5)

## 2018-12-17 LAB — T3, FREE: T3 FREE: 2.3 pg/mL (ref 2.0–4.4)

## 2018-12-17 LAB — T4: T4, Total: 8.3 ug/dL (ref 4.5–12.0)

## 2018-12-17 LAB — TSH: TSH: 3.94 u[IU]/mL (ref 0.450–4.500)

## 2018-12-17 MED ORDER — VITAMIN D (ERGOCALCIFEROL) 1.25 MG (50000 UNIT) PO CAPS: 50000.0000 [IU] | ORAL_CAPSULE | ORAL | 3 refills | Status: DC

## 2018-12-17 NOTE — Telephone Encounter (Signed)
-----   Message from [name], [place] sent at 12/17/2018  9:04 AM EDT ----- Thyroid levels are normal.  Kidney functions are stable.  Total cholesterol is normal.  LDL is slightly elevated at 122 goal is less than 99.  Limit your intake of fried and fatty foods. Vitamin d is 19.8 goal is greater than 30 I will send a prescription for vitamin d 50,000 units once per week.

## 2019-03-04 ENCOUNTER — Other Ambulatory Visit: Payer: Medicare Other

## 2019-03-07 ENCOUNTER — Other Ambulatory Visit: Payer: Self-pay | Admitting: Nurse Practitioner

## 2019-03-10 ENCOUNTER — Other Ambulatory Visit: Payer: Medicare Other

## 2019-03-13 ENCOUNTER — Other Ambulatory Visit: Payer: Self-pay | Admitting: Nurse Practitioner

## 2019-03-13 DIAGNOSIS — E079 Disorder of thyroid, unspecified: Secondary | ICD-10-CM

## 2019-03-13 DIAGNOSIS — I10 Essential (primary) hypertension: Secondary | ICD-10-CM

## 2019-03-14 ENCOUNTER — Other Ambulatory Visit: Payer: Self-pay | Admitting: Nurse Practitioner

## 2019-03-14 DIAGNOSIS — E7800 Pure hypercholesterolemia, unspecified: Secondary | ICD-10-CM

## 2019-03-17 ENCOUNTER — Telehealth: Payer: Self-pay

## 2019-03-17 NOTE — Telephone Encounter (Signed)
Pharmacy called stating the valsartan hctz is on statewide back order but they do have the prescriptions separately  And wanted to know if it is ok to split them up.  RETURNED THEIR CALL AND NOTIFIED THEM IT IS OK TO SPLIT IT YRL,RMA

## 2019-03-18 ENCOUNTER — Other Ambulatory Visit: Payer: Self-pay

## 2019-03-19 ENCOUNTER — Ambulatory Visit (INDEPENDENT_AMBULATORY_CARE_PROVIDER_SITE_OTHER): Payer: Medicare Other | Admitting: Nurse Practitioner

## 2019-03-19 ENCOUNTER — Encounter: Payer: Self-pay | Admitting: Nurse Practitioner

## 2019-03-19 VITALS — BP 140/82 | Temp 98.9°F | Ht 64.0 in | Wt 187.4 lb

## 2019-03-19 DIAGNOSIS — I10 Essential (primary) hypertension: Secondary | ICD-10-CM

## 2019-03-19 DIAGNOSIS — Z1231 Encounter for screening mammogram for malignant neoplasm of breast: Secondary | ICD-10-CM

## 2019-03-19 DIAGNOSIS — E039 Hypothyroidism, unspecified: Secondary | ICD-10-CM

## 2019-03-19 DIAGNOSIS — E559 Vitamin D deficiency, unspecified: Secondary | ICD-10-CM | POA: Diagnosis not present

## 2019-03-19 DIAGNOSIS — E782 Mixed hyperlipidemia: Secondary | ICD-10-CM | POA: Diagnosis not present

## 2019-03-19 DIAGNOSIS — R000 Tachycardia, unspecified: Secondary | ICD-10-CM

## 2019-03-19 NOTE — Progress Notes (Signed)
Subjective:     Patient ID: Janet Barber , female    DOB: [birth year] , 78 y.o.   MRN: [id number]   Chief Complaint  Patient presents with  . Hypertension    HPI  Hyperlipidemia - she is doing well with statin, negative for cramping or muscle aches    Hypertension This is a chronic problem. The current episode started more than 1 year ago. The problem is uncontrolled. Pertinent negatives include no anxiety, chest pain, headaches or palpitations. There are no associated agents to hypertension. Risk factors for coronary artery disease include sedentary lifestyle and obesity. Past treatments include diuretics and angiotensin blockers. There are no compliance problems.  There is no history of angina. There is no history of chronic renal disease.     Past Medical History:  Diagnosis Date  . Allergic rhinitis   . Anxiety   . Colon cancer ([place])   . Diverticulosis of colon   . History of hemorrhoids   . Hx of colonic polyps   . Hypercholesterolemia   . Hypertension   . Hypothyroidism   . Lumbar back pain   . Mitral valve prolapse   . Overweight(278.02)   . Venous insufficiency   . Vitamin D deficiency      No family history on file.   Current Outpatient Medications:  .  aspirin 81 MG chewable tablet, Chew 81 mg by mouth daily., Disp: , Rfl:  .  atorvastatin (LIPITOR) 10 MG tablet, TAKE 1 TABLET BY MOUTH EVERY DAY, Disp: 90 tablet, Rfl: 0 .  hydrocortisone cream 1 %, APPLY TO AFFECTED AREA THREE TIMES A DAY IF NEEDED IF NEEDED, Disp: 28.35 g, Rfl: 0 .  levothyroxine (SYNTHROID) 25 MCG tablet, TAKE 1 TABLET BY MOUTH EVERY DAY, Disp: 90 tablet, Rfl: 0 .  NON FORMULARY, Allergy shot 1 time a month, Disp: , Rfl:  .  valsartan-hydrochlorothiazide (DIOVAN-HCT) 320-25 MG tablet, TAKE 1 TABLET BY MOUTH DAILY, Disp: 90 tablet, Rfl: 0 .  vitamin B-12 (CYANOCOBALAMIN) 250 MCG tablet, Take 250 mcg by mouth daily., Disp: , Rfl:  .  Vitamin D, Ergocalciferol, (DRISDOL) 1.25 MG (50000  UT) CAPS capsule, Take 1 capsule (50,000 Units total) by mouth every 7 (seven) days., Disp: 4 capsule, Rfl: 3   Allergies  Allergen Reactions  . Shellfish Allergy Itching  . Penicillins     REACTION: unsure of reaction---years ago     Review of Systems  Constitutional: Negative.   Respiratory: Negative.   Cardiovascular: Negative.  Negative for chest pain, palpitations and leg swelling.  Musculoskeletal: Negative.   Skin: Positive for rash (upper posterior shoulder).  Neurological: Negative for dizziness and headaches.     Today's Vitals   03/19/19 0919  BP: 140/82  Temp: 98.9 F (37.2 C)  TempSrc: Oral  SpO2: 96%  Weight: 187 lb 6.4 oz (85 kg)  Height: 5\' 4"  (1.626 m)   Body mass index is 32.17 kg/m.   Objective:  Physical Exam Vitals signs reviewed.  Constitutional:      Appearance: She is well-developed.  HENT:     Head: Normocephalic and atraumatic.  Eyes:     Pupils: Pupils are equal, round, and reactive to light.  Cardiovascular:     Rate and Rhythm: Normal rate and regular rhythm.     Pulses: Normal pulses.     Heart sounds: Normal heart sounds. No murmur.  Pulmonary:     Effort: Pulmonary effort is normal.     Breath sounds: Normal breath sounds.  Musculoskeletal: Normal range of motion.  Skin:    General: Skin is warm and dry.     Capillary Refill: Capillary refill takes less than 2 seconds.  Neurological:     General: No focal deficit present.     Mental Status: She is alert and oriented to person, place, and time.     Cranial Nerves: No cranial nerve deficit.  Psychiatric:        Mood and Affect: Mood normal.        Behavior: Behavior normal.        Thought Content: Thought content normal.        Judgment: Judgment normal.         Assessment And Plan:     1. Essential hypertension . B/P is controlled.  . CMP ordered to check renal function.  . The importance of regular exercise and dietary modification was stressed to the patient.   . Stressed importance of losing ten percent of her body weight to help with B/P control.  . The weight loss would help with decreasing cardiac and cancer risk as well.   2. Mixed hyperlipidemia  Chronic, controlled  Continue with current medications  3. Hypothyroidism, unspecified type  Chronic, controlled  Continue with current medications  4. Vitamin D deficiency  Will check vitamin D level and supplement as needed.     Also encouraged to spend 15 minutes in the sun daily.    5. Screening mammogram, encounter for  Pt instructed on Self Breast Exam.According to ACOG guidelines Women aged 73 and older are recommended to get an annual mammogram. Form completed and given to patient contact the [facility] for appointment scheduing.   Pt encouraged to get annual mammogram - MM Digital Screening; Future  6. Tachycardia  EKG reveals tachycardia  This is new for her and she feels her heart racing  Advised to take an aspirin daily and to avoid caffeine    [name], FNP    THE PATIENT IS ENCOURAGED TO PRACTICE SOCIAL DISTANCING DUE TO THE COVID-19 PANDEMIC.

## 2019-03-20 LAB — CBC
Hematocrit: 40.2 % (ref 34.0–46.6)
Hemoglobin: 13.4 g/dL (ref 11.1–15.9)
MCH: 29.6 pg (ref 26.6–33.0)
MCHC: 33.3 g/dL (ref 31.5–35.7)
MCV: 89 fL (ref 79–97)
Platelets: 222 10*3/uL (ref 150–450)
RBC: 4.53 x10E6/uL (ref 3.77–5.28)
RDW: 12 % (ref 11.7–15.4)
WBC: 5.6 10*3/uL (ref 3.4–10.8)

## 2019-03-20 LAB — CMP14+EGFR
ALT: 13 IU/L (ref 0–32)
AST: 20 IU/L (ref 0–40)
Albumin/Globulin Ratio: 1.5 (ref 1.2–2.2)
Albumin: 4.1 g/dL (ref 3.7–4.7)
Alkaline Phosphatase: 87 IU/L (ref 39–117)
BUN/Creatinine Ratio: 15 (ref 12–28)
BUN: 15 mg/dL (ref 8–27)
Bilirubin Total: 0.7 mg/dL (ref 0.0–1.2)
CO2: 24 mmol/L (ref 20–29)
Calcium: 9.7 mg/dL (ref 8.7–10.3)
Chloride: 106 mmol/L (ref 96–106)
Creatinine, Ser: 0.98 mg/dL (ref 0.57–1.00)
GFR calc Af Amer: 64 mL/min/{1.73_m2} (ref >59)
GFR calc non Af Amer: 56 mL/min/{1.73_m2} — ABNORMAL LOW (ref >59)
Globulin, Total: 2.7 g/dL (ref 1.5–4.5)
Glucose: 102 mg/dL — ABNORMAL HIGH (ref 65–99)
Potassium: 3.7 mmol/L (ref 3.5–5.2)
Sodium: 145 mmol/L — ABNORMAL HIGH (ref 134–144)
Total Protein: 6.8 g/dL (ref 6.0–8.5)

## 2019-03-20 LAB — T3: T3, Total: 112 ng/dL (ref 71–180)

## 2019-03-20 LAB — TSH: TSH: 4.44 u[IU]/mL (ref 0.450–4.500)

## 2019-03-20 LAB — T4, FREE: Free T4: 1.17 ng/dL (ref 0.82–1.77)

## 2019-04-06 ENCOUNTER — Other Ambulatory Visit: Payer: Self-pay | Admitting: Nurse Practitioner

## 2019-04-06 DIAGNOSIS — E559 Vitamin D deficiency, unspecified: Secondary | ICD-10-CM

## 2019-04-22 ENCOUNTER — Telehealth: Payer: Self-pay | Admitting: Cardiovascular Disease

## 2019-04-22 NOTE — Telephone Encounter (Signed)
Called patient and LVM for patient to schedule a new patient appointment with Dr. Oval Linsey.

## 2019-05-07 ENCOUNTER — Ambulatory Visit: Payer: Medicare Other

## 2019-05-09 ENCOUNTER — Encounter: Payer: Self-pay | Admitting: *Deleted

## 2019-05-25 NOTE — Progress Notes (Signed)
Referring-Janece Laurance Flatten FNP Reason for referral-hypertension and palpitations  HPI: 78 year old female for evaluation of hypertension and palpitations at request of Marrianne Mood FNP.  Laboratories July 2020 showed TSH 4.440 and normal hemoglobin.  Recently noted to have sinus tachycardia on electrocardiogram and cardiology asked to evaluate.  Patient denies dyspnea, chest pain, palpitations or syncope.  No pedal edema.  Current Outpatient Medications  Medication Sig Dispense Refill  . aspirin 81 MG chewable tablet Chew 81 mg by mouth daily.    Marland Kitchen atorvastatin (LIPITOR) 10 MG tablet TAKE 1 TABLET BY MOUTH EVERY DAY 90 tablet 0  . hydrocortisone cream 1 % APPLY TO AFFECTED AREA THREE TIMES A DAY IF NEEDED IF NEEDED 28.35 g 0  . levothyroxine (SYNTHROID) 25 MCG tablet TAKE 1 TABLET BY MOUTH EVERY DAY 90 tablet 0  . NON FORMULARY Allergy shot 1 time a month    . valsartan-hydrochlorothiazide (DIOVAN-HCT) 320-25 MG tablet TAKE 1 TABLET BY MOUTH DAILY 90 tablet 0  . vitamin B-12 (CYANOCOBALAMIN) 250 MCG tablet Take 250 mcg by mouth daily.    . Vitamin D, Ergocalciferol, (DRISDOL) 1.25 MG (50000 UT) CAPS capsule Take 1 capsule (50,000 Units total) by mouth every 7 (seven) days. 4 capsule 3   No current facility-administered medications for this visit.     Allergies  Allergen Reactions  . Shellfish Allergy Itching  . Penicillins     REACTION: unsure of reaction---years ago     Past Medical History:  Diagnosis Date  . Allergic rhinitis   . Anxiety   . Colon cancer ([place])   . Diverticulosis of colon   . History of hemorrhoids   . Hx of colonic polyps   . Hypercholesterolemia   . Hypertension   . Hypothyroidism   . Lumbar back pain   . Mitral valve prolapse   . Overweight(278.02)   . Venous insufficiency   . Vitamin D deficiency     Past Surgical History:  Procedure Laterality Date  . ABDOMINAL HYSTERECTOMY  1974  . benign breast biopsy  2006  . BREAST REDUCTION SURGERY  Bilateral 04/02/2018   Procedure: MAMMARY REDUCTION  (BREAST);  Surgeon: Irene Limbo, MD;  Location: [facility];  Service: Plastics;  Laterality: Bilateral;  . MASS EXCISION Left 04/02/2018   Procedure: EXCISION LEFT BREAST MASS;  Surgeon: Irene Limbo, MD;  Location: [facility];  Service: Plastics;  Laterality: Left;  . UMBILICAL HERNIA REPAIR  1988    Social History   Socioeconomic History  . Marital status: Widowed    Spouse name: Not on file  . Number of children: 2  . Years of education: Not on file  . Highest education level: Not on file  Occupational History  . Not on file  Social Needs  . Financial resource strain: Not hard at all  . Food insecurity    Worry: Never true    Inability: Never true  . Transportation needs    Medical: No    Non-medical: No  Tobacco Use  . Smoking status: Former Smoker    Types: Cigarettes    Quit date: 09/18/1986    Years since quitting: 32.7  . Smokeless tobacco: Never Used  Substance and Sexual Activity  . Alcohol use: No  . Drug use: No  . Sexual activity: Not Currently  Lifestyle  . Physical activity    Days per week: 3 days    Minutes per session: 30 min  . Stress: Not at all  Relationships  .  Social connections    Talks on phone: More than three times a week    Gets together: More than three times a week    Attends religious service: More than 4 times per year    Active member of club or organization: No    Attends meetings of clubs or organizations: Never    Relationship status: Widowed  . Intimate partner violence    Fear of current or ex partner: No    Emotionally abused: No    Physically abused: No    Forced sexual activity: No  Other Topics Concern  . Not on file  Social History Narrative  . Not on file    Family History  Problem Relation Age of Onset  . Hypertension Maternal Grandmother     ROS: no fevers or chills, productive cough, hemoptysis, dysphasia, odynophagia,  melena, hematochezia, dysuria, hematuria, rash, seizure activity, orthopnea, PND, pedal edema, claudication. Remaining systems are negative.  Physical Exam:   Blood pressure (!) 132/98, pulse (!) 108, temperature 97.6 F (36.4 C), height 5\' 6"  (1.676 m), weight 184 lb 6.4 oz (83.6 kg), SpO2 97 %.  General:  Well developed/well nourished in NAD Skin warm/dry Patient not depressed No peripheral clubbing Back-normal HEENT-normal/normal eyelids Neck supple/normal carotid upstroke bilaterally; no bruits; no JVD; no thyromegaly chest - CTA/ normal expansion CV - RRR/normal S1 and S2; no murmurs, rubs or gallops;  PMI nondisplaced Abdomen -NT/ND, no HSM, no mass, + bowel sounds, no bruit 2+ femoral pulses, no bruits Ext-no edema, chords, 2+ DP Neuro-grossly nonfocal  ECG -March 19, 2019-sinus tachycardia at a rate of 115, no ST changes.  Personally reviewed  Electrocardiogram today shows sinus tachycardia with occasional PAC.  A/P  1 tachycardia-patient has sinus tachycardia but no palpitations.  Her blood pressure is also elevated.  I will add carvedilol 6.25 mg twice daily.  2 hypertension-patient's blood pressure is elevated.  Add carvedilol 6.25 mg twice daily and advance as needed.  Schedule echocardiogram to assess LV function and LV thickness.  3 hyperlipidemia-continue statin.  Kirk Ruths, MD

## 2019-05-27 ENCOUNTER — Ambulatory Visit (INDEPENDENT_AMBULATORY_CARE_PROVIDER_SITE_OTHER): Payer: Medicare Other | Admitting: Cardiology

## 2019-05-27 ENCOUNTER — Other Ambulatory Visit: Payer: Self-pay

## 2019-05-27 ENCOUNTER — Encounter: Payer: Self-pay | Admitting: Cardiology

## 2019-05-27 VITALS — BP 132/98 | HR 108 | Temp 97.6°F | Ht 66.0 in | Wt 184.4 lb

## 2019-05-27 DIAGNOSIS — I10 Essential (primary) hypertension: Secondary | ICD-10-CM | POA: Diagnosis not present

## 2019-05-27 DIAGNOSIS — R000 Tachycardia, unspecified: Secondary | ICD-10-CM | POA: Diagnosis not present

## 2019-05-27 MED ORDER — CARVEDILOL 6.25 MG PO TABS: 6.2500 mg | ORAL_TABLET | Freq: Two times a day (BID) | ORAL | 0 refills | Status: DC

## 2019-05-27 NOTE — Patient Instructions (Signed)
Medication Instructions:  Start Carvedilol 6.25 mg twice daily.  If you need a refill on your cardiac medications before your next appointment, please call your pharmacy.   Testing/Procedures: Echocardiogram - Your physician has requested that you have an echocardiogram. Echocardiography is a painless test that uses sound waves to create images of your heart. It provides your doctor with information about the size and shape of your heart and how well your heart's chambers and valves are working. This procedure takes approximately one hour. There are no restrictions for this procedure. This will be performed at our [facility] location - [address], [address].   Follow-Up: At [facility], you and your health needs are our priority.  As part of our continuing mission to provide you with exceptional heart care, we have created designated Provider Care Teams.  These Care Teams include your primary Cardiologist (physician) and Advanced Practice Providers (APPs -  Physician Assistants and Nurse Practitioners) who all work together to provide you with the care you need, when you need it. You will need a follow up appointment in 3 months.  Please call our office 2 months in advance to schedule this appointment.  You may see Dr.Crenshaw or one of the following Advanced Practice Providers on your designated Care Team:   Kerin Ransom, PA-C Roby Lofts, [place] . Sande Rives, PA-C

## 2019-06-02 ENCOUNTER — Ambulatory Visit (HOSPITAL_COMMUNITY): Payer: Medicare Other | Attending: Cardiology

## 2019-06-02 ENCOUNTER — Other Ambulatory Visit: Payer: Self-pay

## 2019-06-02 DIAGNOSIS — I10 Essential (primary) hypertension: Secondary | ICD-10-CM

## 2019-06-14 ENCOUNTER — Other Ambulatory Visit: Payer: Self-pay | Admitting: Nurse Practitioner

## 2019-06-15 ENCOUNTER — Other Ambulatory Visit: Payer: Self-pay | Admitting: Nurse Practitioner

## 2019-06-15 DIAGNOSIS — E7800 Pure hypercholesterolemia, unspecified: Secondary | ICD-10-CM

## 2019-06-17 ENCOUNTER — Encounter: Payer: Self-pay | Admitting: Nurse Practitioner

## 2019-06-19 ENCOUNTER — Other Ambulatory Visit: Payer: Self-pay

## 2019-06-19 ENCOUNTER — Ambulatory Visit (INDEPENDENT_AMBULATORY_CARE_PROVIDER_SITE_OTHER): Payer: Medicare Other | Admitting: Nurse Practitioner

## 2019-06-19 ENCOUNTER — Ambulatory Visit (INDEPENDENT_AMBULATORY_CARE_PROVIDER_SITE_OTHER): Payer: Medicare Other

## 2019-06-19 ENCOUNTER — Encounter: Payer: Self-pay | Admitting: Nurse Practitioner

## 2019-06-19 VITALS — BP 140/82 | HR 77 | Temp 98.8°F | Ht 65.2 in | Wt 185.4 lb

## 2019-06-19 DIAGNOSIS — E559 Vitamin D deficiency, unspecified: Secondary | ICD-10-CM | POA: Diagnosis not present

## 2019-06-19 DIAGNOSIS — E782 Mixed hyperlipidemia: Secondary | ICD-10-CM | POA: Diagnosis not present

## 2019-06-19 DIAGNOSIS — E039 Hypothyroidism, unspecified: Secondary | ICD-10-CM | POA: Diagnosis not present

## 2019-06-19 DIAGNOSIS — Z0000 Encounter for general adult medical examination without abnormal findings: Secondary | ICD-10-CM | POA: Diagnosis not present

## 2019-06-19 DIAGNOSIS — I10 Essential (primary) hypertension: Secondary | ICD-10-CM | POA: Diagnosis not present

## 2019-06-19 DIAGNOSIS — Z23 Encounter for immunization: Secondary | ICD-10-CM | POA: Diagnosis not present

## 2019-06-19 DIAGNOSIS — R000 Tachycardia, unspecified: Secondary | ICD-10-CM

## 2019-06-19 LAB — POCT URINALYSIS DIPSTICK
Bilirubin, UA: NEGATIVE
Blood, UA: NEGATIVE
Glucose, UA: NEGATIVE
Ketones, UA: NEGATIVE
Leukocytes, UA: NEGATIVE
Nitrite, UA: NEGATIVE
Protein, UA: NEGATIVE
Spec Grav, UA: 1.02 (ref 1.010–1.025)
Urobilinogen, UA: 1 E.U./dL
pH, UA: 6 (ref 5.0–8.0)

## 2019-06-19 LAB — POCT UA - MICROALBUMIN
Albumin/Creatinine Ratio, Urine, POC: <30
Creatinine, POC: 300 mg/dL
Microalbumin Ur, POC: 10 mg/L

## 2019-06-19 MED ORDER — PREVNAR 13 IM SUSP: 0.5000 mL | INTRAMUSCULAR | 0 refills | Status: AC

## 2019-06-19 NOTE — Progress Notes (Signed)
Subjective:     Patient ID: Janet Barber , female    DOB: [birth year] , 78 y.o.   MRN: [id number]   Chief Complaint  Patient presents with  . Hypertension    HPI  Hyperlipidemia - she is doing well with statin, denies cramping or muscle aches    Hypertension This is a chronic problem. The current episode started more than 1 year ago. The problem has been gradually improving since onset. The problem is uncontrolled. Pertinent negatives include no anxiety, chest pain, headaches or palpitations. There are no associated agents to hypertension. Risk factors for coronary artery disease include sedentary lifestyle and obesity. Past treatments include diuretics, angiotensin blockers and beta blockers. The current treatment provides moderate improvement. There are no compliance problems.  There is no history of angina or kidney disease. There is no history of chronic renal disease.     Past Medical History:  Diagnosis Date  . Allergic rhinitis   . Anxiety   . Colon cancer ([place])   . Diverticulosis of colon   . History of hemorrhoids   . Hx of colonic polyps   . Hypercholesterolemia   . Hypertension   . Hypothyroidism   . Lumbar back pain   . Mitral valve prolapse   . Overweight(278.02)   . Venous insufficiency   . Vitamin D deficiency      Family History  Problem Relation Age of Onset  . Hypertension Maternal Grandmother      Current Outpatient Medications:  .  aspirin 81 MG chewable tablet, Chew 81 mg by mouth daily., Disp: , Rfl:  .  atorvastatin (LIPITOR) 10 MG tablet, TAKE 1 TABLET BY MOUTH EVERY DAY, Disp: 90 tablet, Rfl: 0 .  carvedilol (COREG) 6.25 MG tablet, Take 1 tablet (6.25 mg total) by mouth 2 (two) times daily., Disp: 180 tablet, Rfl: 0 .  hydrochlorothiazide (HYDRODIURIL) 25 MG tablet, TAKE 1 TABLET BY MOUTH EVERY DAY, Disp: 90 tablet, Rfl: 0 .  hydrocortisone cream 1 %, APPLY TO AFFECTED AREA THREE TIMES A DAY IF NEEDED IF NEEDED, Disp: 28.35 g, Rfl: 0 .   levothyroxine (SYNTHROID) 25 MCG tablet, TAKE 1 TABLET BY MOUTH EVERY DAY, Disp: 90 tablet, Rfl: 0 .  NON FORMULARY, Allergy shot 1 time a month, Disp: , Rfl:  .  pneumococcal 13-valent conjugate vaccine (PREVNAR 13) SUSP injection, Inject 0.5 mLs into the muscle tomorrow at 10 am for 1 dose., Disp: 0.5 mL, Rfl: 0 .  valsartan (DIOVAN) 320 MG tablet, TAKE 1 TABLET BY MOUTH EVERY DAY, Disp: 90 tablet, Rfl: 0 .  valsartan-hydrochlorothiazide (DIOVAN-HCT) 320-25 MG tablet, TAKE 1 TABLET BY MOUTH DAILY, Disp: 90 tablet, Rfl: 0 .  vitamin B-12 (CYANOCOBALAMIN) 250 MCG tablet, Take 250 mcg by mouth daily., Disp: , Rfl:  .  Vitamin D, Ergocalciferol, (DRISDOL) 1.25 MG (50000 UT) CAPS capsule, Take 1 capsule (50,000 Units total) by mouth every 7 (seven) days., Disp: 4 capsule, Rfl: 3   Allergies  Allergen Reactions  . Shellfish Allergy Itching  . Penicillins     REACTION: unsure of reaction---years ago     Review of Systems  Constitutional: Negative.  Negative for fatigue and fever.  Respiratory: Negative.   Cardiovascular: Negative.  Negative for chest pain, palpitations and leg swelling.  Musculoskeletal: Negative.   Skin: Negative.  Negative for rash.  Neurological: Negative for dizziness and headaches.     Today's Vitals   06/19/19 0902  BP: 140/82  Pulse: 77  Temp: 98.8 F (37.1  C)  TempSrc: Oral  SpO2: 96%  Weight: 185 lb 6.4 oz (84.1 kg)  Height: 5' 5.2" (1.656 m)   Body mass index is 30.66 kg/m.   Objective:  Physical Exam Vitals signs reviewed.  Constitutional:      Appearance: She is well-developed.  HENT:     Head: Normocephalic and atraumatic.  Eyes:     Pupils: Pupils are equal, round, and reactive to light.  Cardiovascular:     Rate and Rhythm: Normal rate and regular rhythm.     Pulses: Normal pulses.     Heart sounds: Normal heart sounds. No murmur.  Pulmonary:     Effort: Pulmonary effort is normal.     Breath sounds: Normal breath sounds.   Musculoskeletal: Normal range of motion.  Skin:    General: Skin is warm and dry.     Capillary Refill: Capillary refill takes less than 2 seconds.  Neurological:     General: No focal deficit present.     Mental Status: She is alert and oriented to person, place, and time.     Cranial Nerves: No cranial nerve deficit.  Psychiatric:        Mood and Affect: Mood normal.        Behavior: Behavior normal.        Thought Content: Thought content normal.        Judgment: Judgment normal.         Assessment And Plan:     1. Essential hypertension . B/P is controlled.  Marland Kitchen BMP ordered to check renal function.  . The importance of regular exercise and dietary modification was stressed to the patient.   2. Mixed hyperlipidemia  Chronic, controlled  Continue with current medications  3. Hypothyroidism, unspecified type  Chronic, last visit was high normal will recheck levels today  Continue current medications pending labs  4. Vitamin D deficiency  Will check vitamin D level and take over the counter vitamin d supplement daily, will make changes pending labs.     Also encouraged to spend 15 minutes in the sun daily.   5. Tachycardia  She has been to Cardiology and had an ECHO which was normal  Coreg 6.25 mg BID was also added to her medication regimen  HR today is [place], FNP    THE PATIENT IS ENCOURAGED TO PRACTICE SOCIAL DISTANCING DUE TO THE COVID-19 PANDEMIC.

## 2019-06-19 NOTE — Progress Notes (Signed)
Subjective:   Janet Barber is a 78 y.o. female who presents for Medicare Annual (Subsequent) preventive examination.  Review of Systems:  n/a Cardiac Risk Factors include: advanced age (>63men, >10 women);dyslipidemia;hypertension     Objective:     Vitals: BP 140/82 (BP Location: Left Arm, Patient Position: Sitting, Cuff Size: Normal)   Pulse 77   Temp 98.8 F (37.1 C) (Oral)   Ht 5' 5.2" (1.656 m)   Wt 185 lb 6.4 oz (84.1 kg)   SpO2 96%   BMI 30.66 kg/m   Body mass index is 30.66 kg/m.  Advanced Directives 06/19/2019 08/13/2018 04/02/2018 03/26/2018 10/03/2016  Does Patient Have a Medical Advance Directive? No No No No No  Would patient like information on creating a medical advance directive? Yes (MAU/Ambulatory/Procedural Areas - Information given) No - Patient declined No - Patient declined - No - Patient declined    Tobacco Social History   Tobacco Use  Smoking Status Former Smoker  . Types: Cigarettes  . Quit date: 09/18/1986  . Years since quitting: 32.7  Smokeless Tobacco Never Used     Counseling given: Not Answered   Clinical Intake:  Pre-visit preparation completed: Yes  Pain : No/denies pain     Nutritional Status: BMI > 30  Obese Nutritional Risks: None Diabetes: No  How often do you need to have someone help you when you read instructions, pamphlets, or other written materials from your doctor or pharmacy?: 1 - Never What is the last grade level you completed in school?: 3 years college  Interpreter Needed?: No  Information entered by :: NSAllen LPN  Past Medical History:  Diagnosis Date  . Allergic rhinitis   . Anxiety   . Colon cancer ([place])   . Diverticulosis of colon   . History of hemorrhoids   . Hx of colonic polyps   . Hypercholesterolemia   . Hypertension   . Hypothyroidism   . Lumbar back pain   . Mitral valve prolapse   . Overweight(278.02)   . Venous insufficiency   . Vitamin D deficiency    Past Surgical History:   Procedure Laterality Date  . ABDOMINAL HYSTERECTOMY  1974  . benign breast biopsy  2006  . BREAST REDUCTION SURGERY Bilateral 04/02/2018   Procedure: MAMMARY REDUCTION  (BREAST);  Surgeon: Irene Limbo, MD;  Location: [facility];  Service: Plastics;  Laterality: Bilateral;  . MASS EXCISION Left 04/02/2018   Procedure: EXCISION LEFT BREAST MASS;  Surgeon: Irene Limbo, MD;  Location: [facility];  Service: Plastics;  Laterality: Left;  . UMBILICAL HERNIA REPAIR  1988   Family History  Problem Relation Age of Onset  . Hypertension Maternal Grandmother    Social History   Socioeconomic History  . Marital status: Widowed    Spouse name: Not on file  . Number of children: 2  . Years of education: Not on file  . Highest education level: Not on file  Occupational History  . Occupation: retired  Scientific laboratory technician  . Financial resource strain: Not hard at all  . Food insecurity    Worry: Never true    Inability: Never true  . Transportation needs    Medical: No    Non-medical: No  Tobacco Use  . Smoking status: Former Smoker    Types: Cigarettes    Quit date: 09/18/1986    Years since quitting: 32.7  . Smokeless tobacco: Never Used  Substance and Sexual Activity  . Alcohol use: No  .  Drug use: No  . Sexual activity: Not Currently  Lifestyle  . Physical activity    Days per week: 3 days    Minutes per session: 30 min  . Stress: Not at all  Relationships  . Social connections    Talks on phone: More than three times a week    Gets together: More than three times a week    Attends religious service: More than 4 times per year    Active member of club or organization: No    Attends meetings of clubs or organizations: Never    Relationship status: Widowed  Other Topics Concern  . Not on file  Social History Narrative  . Not on file    Outpatient Encounter Medications as of 06/19/2019  Medication Sig  . aspirin 81 MG chewable tablet Chew  81 mg by mouth daily.  Marland Kitchen atorvastatin (LIPITOR) 10 MG tablet TAKE 1 TABLET BY MOUTH EVERY DAY  . carvedilol (COREG) 6.25 MG tablet Take 1 tablet (6.25 mg total) by mouth 2 (two) times daily.  . hydrochlorothiazide (HYDRODIURIL) 25 MG tablet TAKE 1 TABLET BY MOUTH EVERY DAY  . hydrocortisone cream 1 % APPLY TO AFFECTED AREA THREE TIMES A DAY IF NEEDED IF NEEDED  . levothyroxine (SYNTHROID) 25 MCG tablet TAKE 1 TABLET BY MOUTH EVERY DAY  . NON FORMULARY Allergy shot 1 time a month  . valsartan (DIOVAN) 320 MG tablet TAKE 1 TABLET BY MOUTH EVERY DAY  . valsartan-hydrochlorothiazide (DIOVAN-HCT) 320-25 MG tablet TAKE 1 TABLET BY MOUTH DAILY  . vitamin B-12 (CYANOCOBALAMIN) 250 MCG tablet Take 250 mcg by mouth daily.  . [DISCONTINUED] Vitamin D, Ergocalciferol, (DRISDOL) 1.25 MG (50000 UT) CAPS capsule Take 1 capsule (50,000 Units total) by mouth every 7 (seven) days.  . pneumococcal 13-valent conjugate vaccine (PREVNAR 13) SUSP injection Inject 0.5 mLs into the muscle tomorrow at 10 am for 1 dose.   No facility-administered encounter medications on file as of 06/19/2019.     Activities of Daily Living In your present state of health, do you have any difficulty performing the following activities: 06/19/2019 08/13/2018  Hearing? N N  Vision? N N  Difficulty concentrating or making decisions? N N  Walking or climbing stairs? N N  Dressing or bathing? N N  Doing errands, shopping? N N  Preparing Food and eating ? N N  Using the Toilet? N N  In the past six months, have you accidently leaked urine? N N  Do you have problems with loss of bowel control? N N  Managing your Medications? N N  Managing your Finances? N N  Housekeeping or managing your Housekeeping? N N  Some recent data might be hidden    Patient Care Team: [name], FNP as PCP - General (General Practice)    Assessment:   This is a routine wellness examination for Janet Barber.  Exercise Activities and Dietary recommendations  Current Exercise Habits: Home exercise routine, Type of exercise: walking, Time (Minutes): 30, Frequency (Times/Week): 3, Weekly Exercise (Minutes/Week): 90  Goals    . Weight (lb) < 200 lb (90.7 kg)     06/19/2019, would like to 10 pounds       Fall Risk Fall Risk  06/19/2019 03/19/2019 12/16/2018 08/13/2018  Falls in the past year? 0 0 0 0  Number falls in past yr: 0 - - 0  Injury with Fall? - - - 0  Risk for fall due to : Medication side effect - - -  Follow  up Falls evaluation completed;Education provided;Falls prevention discussed - - -   Is the patient's home free of loose throw rugs in walkways, pet beds, electrical cords, etc?   yes      Grab bars in the bathroom? no      Handrails on the stairs?   yes      Adequate lighting?   yes  Timed Get Up and Go performed: n/a  Depression Screen PHQ 2/9 Scores 06/19/2019 03/19/2019 12/16/2018 08/13/2018  PHQ - 2 Score 0 0 0 0  PHQ- 9 Score 0 - - -     Cognitive Function     6CIT Screen 06/19/2019 08/13/2018  What Year? 0 points 0 points  What month? 0 points 0 points  What time? 0 points 0 points  Count back from 20 0 points 0 points  Months in reverse 0 points 0 points  Repeat phrase 0 points 6 points  Total Score 0 6    Immunization History  Administered Date(s) Administered  . Influenza Whole 06/03/2008, 07/08/2009  . Influenza-Unspecified 07/18/2011, 06/18/2018, 06/16/2019  . Pneumococcal Polysaccharide-23 06/09/2009  . Tdap 05/06/2013    Qualifies for Shingles Vaccine? yes  Screening Tests Health Maintenance  Topic Date Due  . DEXA SCAN  11/22/2006  . PNA vac Low Risk Adult (2 of 2 - PCV13) 06/09/2010  . TETANUS/TDAP  05/07/2023  . INFLUENZA VACCINE  Completed    Cancer Screenings: Lung: Low Dose CT Chest recommended if Age 7-80 years, 30 pack-year currently smoking OR have quit w/in 15years. Patient does not qualify. Breast:  Up to date on Mammogram? Yes   Up to date of Bone Density/Dexa? Yes Colorectal:  not required  Additional Screenings: : Hepatitis C Screening: n/a     Plan:    Patient wants to lose 10 pounds.   I have personally reviewed and noted the following in the patient's chart:   . Medical and social history . Use of alcohol, tobacco or illicit drugs  . Current medications and supplements . Functional ability and status . Nutritional status . Physical activity . Advanced directives . List of other physicians . Hospitalizations, surgeries, and ER visits in previous 12 months . Vitals . Screenings to include cognitive, depression, and falls . Referrals and appointments  In addition, I have reviewed and discussed with patient certain preventive protocols, quality metrics, and best practice recommendations. A written personalized care plan for preventive services as well as general preventive health recommendations were provided to patient.     Kellie Simmering, LPN  [id number]

## 2019-06-19 NOTE — Patient Instructions (Signed)
   Call back with your medication list

## 2019-06-19 NOTE — Patient Instructions (Signed)
Janet Barber , Thank you for taking time to come for your Medicare Wellness Visit. I appreciate your ongoing commitment to your health goals. Please review the following plan we discussed and let me know if I can assist you in the future.   Screening recommendations/referrals: Colonoscopy: not required Mammogram: 01/2018 Bone Density: due Recommended yearly ophthalmology/optometry visit for glaucoma screening and checkup Recommended yearly dental visit for hygiene and checkup  Vaccinations: Influenza vaccine: 05/2019 Pneumococcal vaccine: sent to pharmacy Tdap vaccine: 04/2013 Shingles vaccine: discussed    Advanced directives: Advance directive discussed with you today. I have provided a copy for you to complete at home and have notarized. Once this is complete please bring a copy in to our office so we can scan it into your chart.   Conditions/risks identified: obesity  Next appointment: 08/21/2019 at 11:30   Preventive Care 77 Years and Older, Female Preventive care refers to lifestyle choices and visits with your health care provider that can promote health and wellness. What does preventive care include?  A yearly physical exam. This is also called an annual well check.  Dental exams once or twice a year.  Routine eye exams. Ask your health care provider how often you should have your eyes checked.  Personal lifestyle choices, including:  Daily care of your teeth and gums.  Regular physical activity.  Eating a healthy diet.  Avoiding tobacco and drug use.  Limiting alcohol use.  Practicing safe sex.  Taking low-dose aspirin every day.  Taking vitamin and mineral supplements as recommended by your health care provider. What happens during an annual well check? The services and screenings done by your health care provider during your annual well check will depend on your age, overall health, lifestyle risk factors, and family history of disease. Counseling  Your  health care provider may ask you questions about your:  Alcohol use.  Tobacco use.  Drug use.  Emotional well-being.  Home and relationship well-being.  Sexual activity.  Eating habits.  History of falls.  Memory and ability to understand (cognition).  Work and work Statistician.  Reproductive health. Screening  You may have the following tests or measurements:  Height, weight, and BMI.  Blood pressure.  Lipid and cholesterol levels. These may be checked every 5 years, or more frequently if you are over 69 years old.  Skin check.  Lung cancer screening. You may have this screening every year starting at age 38 if you have a 30-pack-year history of smoking and currently smoke or have quit within the past 15 years.  Fecal occult blood test (FOBT) of the stool. You may have this test every year starting at age 92.  Flexible sigmoidoscopy or colonoscopy. You may have a sigmoidoscopy every 5 years or a colonoscopy every 10 years starting at age 66.  Hepatitis C blood test.  Hepatitis B blood test.  Sexually transmitted disease (STD) testing.  Diabetes screening. This is done by checking your blood sugar (glucose) after you have not eaten for a while (fasting). You may have this done every 1-3 years.  Bone density scan. This is done to screen for osteoporosis. You may have this done starting at age 75.  Mammogram. This may be done every 1-2 years. Talk to your health care provider about how often you should have regular mammograms. Talk with your health care provider about your test results, treatment options, and if necessary, the need for more tests. Vaccines  Your health care provider may recommend certain vaccines,  such as:  Influenza vaccine. This is recommended every year.  Tetanus, diphtheria, and acellular pertussis (Tdap, Td) vaccine. You may need a Td booster every 10 years.  Zoster vaccine. You may need this after age 37.  Pneumococcal 13-valent  conjugate (PCV13) vaccine. One dose is recommended after age 15.  Pneumococcal polysaccharide (PPSV23) vaccine. One dose is recommended after age 62. Talk to your health care provider about which screenings and vaccines you need and how often you need them. This information is not intended to replace advice given to you by your health care provider. Make sure you discuss any questions you have with your health care provider. Document Released: 10/01/2015 Document Revised: 05/24/2016 Document Reviewed: 07/06/2015 Elsevier Interactive Patient Education  2017 [place] Prevention in the Home Falls can cause injuries. They can happen to people of all ages. There are many things you can do to make your home safe and to help prevent falls. What can I do on the outside of my home?  Regularly fix the edges of walkways and driveways and fix any cracks.  Remove anything that might make you trip as you walk through a door, such as a raised step or threshold.  Trim any bushes or trees on the path to your home.  Use bright outdoor lighting.  Clear any walking paths of anything that might make someone trip, such as rocks or tools.  Regularly check to see if handrails are loose or broken. Make sure that both sides of any steps have handrails.  Any raised decks and porches should have guardrails on the edges.  Have any leaves, snow, or ice cleared regularly.  Use sand or salt on walking paths during winter.  Clean up any spills in your garage right away. This includes oil or grease spills. What can I do in the bathroom?  Use night lights.  Install grab bars by the toilet and in the tub and shower. Do not use towel bars as grab bars.  Use non-skid mats or decals in the tub or shower.  If you need to sit down in the shower, use a plastic, non-slip stool.  Keep the floor dry. Clean up any water that spills on the floor as soon as it happens.  Remove soap buildup in the tub or  shower regularly.  Attach bath mats securely with double-sided non-slip rug tape.  Do not have throw rugs and other things on the floor that can make you trip. What can I do in the bedroom?  Use night lights.  Make sure that you have a light by your bed that is easy to reach.  Do not use any sheets or blankets that are too big for your bed. They should not hang down onto the floor.  Have a firm chair that has side arms. You can use this for support while you get dressed.  Do not have throw rugs and other things on the floor that can make you trip. What can I do in the kitchen?  Clean up any spills right away.  Avoid walking on wet floors.  Keep items that you use a lot in easy-to-reach places.  If you need to reach something above you, use a strong step stool that has a grab bar.  Keep electrical cords out of the way.  Do not use floor polish or wax that makes floors slippery. If you must use wax, use non-skid floor wax.  Do not have throw rugs and other things on  the floor that can make you trip. What can I do with my stairs?  Do not leave any items on the stairs.  Make sure that there are handrails on both sides of the stairs and use them. Fix handrails that are broken or loose. Make sure that handrails are as long as the stairways.  Check any carpeting to make sure that it is firmly attached to the stairs. Fix any carpet that is loose or worn.  Avoid having throw rugs at the top or bottom of the stairs. If you do have throw rugs, attach them to the floor with carpet tape.  Make sure that you have a light switch at the top of the stairs and the bottom of the stairs. If you do not have them, ask someone to add them for you. What else can I do to help prevent falls?  Wear shoes that:  Do not have high heels.  Have rubber bottoms.  Are comfortable and fit you well.  Are closed at the toe. Do not wear sandals.  If you use a stepladder:  Make sure that it is fully  opened. Do not climb a closed stepladder.  Make sure that both sides of the stepladder are locked into place.  Ask someone to hold it for you, if possible.  Clearly mark and make sure that you can see:  Any grab bars or handrails.  First and last steps.  Where the edge of each step is.  Use tools that help you move around (mobility aids) if they are needed. These include:  Canes.  Walkers.  Scooters.  Crutches.  Turn on the lights when you go into a dark area. Replace any light bulbs as soon as they burn out.  Set up your furniture so you have a clear path. Avoid moving your furniture around.  If any of your floors are uneven, fix them.  If there are any pets around you, be aware of where they are.  Review your medicines with your doctor. Some medicines can make you feel dizzy. This can increase your chance of falling. Ask your doctor what other things that you can do to help prevent falls. This information is not intended to replace advice given to you by your health care provider. Make sure you discuss any questions you have with your health care provider. Document Released: 07/01/2009 Document Revised: 02/10/2016 Document Reviewed: 10/09/2014 Elsevier Interactive Patient Education  2017 [employer].

## 2019-06-20 LAB — TSH: TSH: 5.54 u[IU]/mL — ABNORMAL HIGH (ref 0.450–4.500)

## 2019-06-20 LAB — BMP8+EGFR
BUN/Creatinine Ratio: 15 (ref 12–28)
BUN: 14 mg/dL (ref 8–27)
CO2: 27 mmol/L (ref 20–29)
Calcium: 9.7 mg/dL (ref 8.7–10.3)
Chloride: 105 mmol/L (ref 96–106)
Creatinine, Ser: 0.96 mg/dL (ref 0.57–1.00)
GFR calc Af Amer: 66 mL/min/{1.73_m2} (ref >59)
GFR calc non Af Amer: 57 mL/min/{1.73_m2} — ABNORMAL LOW (ref >59)
Glucose: 100 mg/dL — ABNORMAL HIGH (ref 65–99)
Potassium: 3.5 mmol/L (ref 3.5–5.2)
Sodium: 144 mmol/L (ref 134–144)

## 2019-06-20 LAB — T3, FREE: T3, Free: 3.2 pg/mL (ref 2.0–4.4)

## 2019-06-20 LAB — T4: T4, Total: 7.3 ug/dL (ref 4.5–12.0)

## 2019-06-20 LAB — VITAMIN D 25 HYDROXY (VIT D DEFICIENCY, FRACTURES): Vit D, 25-Hydroxy: 42.7 ng/mL (ref 30.0–100.0)

## 2019-06-23 ENCOUNTER — Other Ambulatory Visit: Payer: Self-pay

## 2019-06-23 ENCOUNTER — Encounter: Payer: Self-pay | Admitting: Nurse Practitioner

## 2019-06-23 DIAGNOSIS — E079 Disorder of thyroid, unspecified: Secondary | ICD-10-CM

## 2019-06-23 MED ORDER — LEVOTHYROXINE SODIUM 25 MCG PO TABS: 25.0000 ug | ORAL_TABLET | Freq: Every day | ORAL | 0 refills | Status: DC

## 2019-08-21 ENCOUNTER — Ambulatory Visit: Payer: Medicare Other

## 2019-08-21 ENCOUNTER — Ambulatory Visit: Payer: Medicare Other | Admitting: Nurse Practitioner

## 2019-08-25 ENCOUNTER — Other Ambulatory Visit: Payer: Self-pay

## 2019-08-25 MED ORDER — CARVEDILOL 6.25 MG PO TABS: 6.2500 mg | ORAL_TABLET | Freq: Two times a day (BID) | ORAL | 3 refills | Status: DC

## 2019-08-25 NOTE — Progress Notes (Deleted)
HPI: FU hypertension and palpitations.  Laboratories July 2020 showed TSH 4.440 and normal hemoglobin.  Echocardiogram September 2020 showed normal LV function, moderate left ventricular hypertrophy, trace aortic insufficiency; ascending aorta mildly dilated.  Since last seen,   Current Outpatient Medications  Medication Sig Dispense Refill  . aspirin 81 MG chewable tablet Chew 81 mg by mouth daily.    Marland Kitchen atorvastatin (LIPITOR) 10 MG tablet TAKE 1 TABLET BY MOUTH EVERY DAY 90 tablet 0  . carvedilol (COREG) 6.25 MG tablet Take 1 tablet (6.25 mg total) by mouth 2 (two) times daily. 180 tablet 0  . hydrochlorothiazide (HYDRODIURIL) 25 MG tablet TAKE 1 TABLET BY MOUTH EVERY DAY 90 tablet 0  . hydrocortisone cream 1 % APPLY TO AFFECTED AREA THREE TIMES A DAY IF NEEDED IF NEEDED 28.35 g 0  . levothyroxine (SYNTHROID) 25 MCG tablet Take 1 tablet (25 mcg total) by mouth daily. 90 tablet 0  . NON FORMULARY Allergy shot 1 time a month    . valsartan (DIOVAN) 320 MG tablet TAKE 1 TABLET BY MOUTH EVERY DAY 90 tablet 0  . vitamin B-12 (CYANOCOBALAMIN) 250 MCG tablet Take 250 mcg by mouth daily.     No current facility-administered medications for this visit.      Past Medical History:  Diagnosis Date  . Allergic rhinitis   . Anxiety   . Colon cancer ([place])   . Diverticulosis of colon   . History of hemorrhoids   . Hx of colonic polyps   . Hypercholesterolemia   . Hypertension   . Hypothyroidism   . Lumbar back pain   . Mitral valve prolapse   . Overweight(278.02)   . Venous insufficiency   . Vitamin D deficiency     Past Surgical History:  Procedure Laterality Date  . ABDOMINAL HYSTERECTOMY  1974  . benign breast biopsy  2006  . BREAST REDUCTION SURGERY Bilateral 04/02/2018   Procedure: MAMMARY REDUCTION  (BREAST);  Surgeon: Irene Limbo, MD;  Location: [facility];  Service: Plastics;  Laterality: Bilateral;  . MASS EXCISION Left 04/02/2018   Procedure:  EXCISION LEFT BREAST MASS;  Surgeon: Irene Limbo, MD;  Location: [facility];  Service: Plastics;  Laterality: Left;  . UMBILICAL HERNIA REPAIR  1988    Social History   Socioeconomic History  . Marital status: Widowed    Spouse name: Not on file  . Number of children: 2  . Years of education: Not on file  . Highest education level: Not on file  Occupational History  . Occupation: retired  Scientific laboratory technician  . Financial resource strain: Not hard at all  . Food insecurity    Worry: Never true    Inability: Never true  . Transportation needs    Medical: No    Non-medical: No  Tobacco Use  . Smoking status: Former Smoker    Types: Cigarettes    Quit date: 09/18/1986    Years since quitting: 32.9  . Smokeless tobacco: Never Used  Substance and Sexual Activity  . Alcohol use: No  . Drug use: No  . Sexual activity: Not Currently  Lifestyle  . Physical activity    Days per week: 3 days    Minutes per session: 30 min  . Stress: Not at all  Relationships  . Social connections    Talks on phone: More than three times a week    Gets together: More than three times a week  Attends religious service: More than 4 times per year    Active member of club or organization: No    Attends meetings of clubs or organizations: Never    Relationship status: Widowed  . Intimate partner violence    Fear of current or ex partner: No    Emotionally abused: No    Physically abused: No    Forced sexual activity: No  Other Topics Concern  . Not on file  Social History Narrative  . Not on file    Family History  Problem Relation Age of Onset  . Hypertension Maternal Grandmother     ROS: no fevers or chills, productive cough, hemoptysis, dysphasia, odynophagia, melena, hematochezia, dysuria, hematuria, rash, seizure activity, orthopnea, PND, pedal edema, claudication. Remaining systems are negative.  Physical Exam: Well-developed well-nourished in no acute distress.   Skin is warm and dry.  HEENT is normal.  Neck is supple.  Chest is clear to auscultation with normal expansion.  Cardiovascular exam is regular rate and rhythm.  Abdominal exam nontender or distended. No masses palpated. Extremities show no edema. neuro grossly intact  ECG- personally reviewed  A/P  1 tachycardia-previously documented to have sinus tachycardia but she denies palpitations.  Continue carvedilol.  2 hypertension-blood pressure controlled.  Continue present medications and follow.  3 hyperlipidemia-continue statin.  Kirk Ruths, MD

## 2019-08-27 ENCOUNTER — Ambulatory Visit: Payer: Medicare Other | Admitting: Cardiology

## 2019-08-28 ENCOUNTER — Ambulatory Visit: Payer: Medicare Other | Admitting: Cardiology

## 2019-09-02 ENCOUNTER — Encounter: Payer: Self-pay | Admitting: Nurse Practitioner

## 2019-09-12 ENCOUNTER — Other Ambulatory Visit: Payer: Self-pay | Admitting: Nurse Practitioner

## 2019-09-12 DIAGNOSIS — E7800 Pure hypercholesterolemia, unspecified: Secondary | ICD-10-CM

## 2019-09-16 ENCOUNTER — Other Ambulatory Visit: Payer: Self-pay | Admitting: Nurse Practitioner

## 2019-09-21 ENCOUNTER — Other Ambulatory Visit: Payer: Self-pay | Admitting: Nurse Practitioner

## 2019-09-21 DIAGNOSIS — E079 Disorder of thyroid, unspecified: Secondary | ICD-10-CM

## 2019-09-23 ENCOUNTER — Ambulatory Visit (INDEPENDENT_AMBULATORY_CARE_PROVIDER_SITE_OTHER): Payer: Medicare Other | Admitting: Nurse Practitioner

## 2019-09-23 ENCOUNTER — Encounter: Payer: Self-pay | Admitting: Nurse Practitioner

## 2019-09-23 ENCOUNTER — Other Ambulatory Visit: Payer: Self-pay

## 2019-09-23 VITALS — BP 134/88 | HR 99 | Temp 98.6°F | Ht 65.0 in | Wt 182.2 lb

## 2019-09-23 DIAGNOSIS — I10 Essential (primary) hypertension: Secondary | ICD-10-CM

## 2019-09-23 DIAGNOSIS — E559 Vitamin D deficiency, unspecified: Secondary | ICD-10-CM | POA: Diagnosis not present

## 2019-09-23 DIAGNOSIS — E039 Hypothyroidism, unspecified: Secondary | ICD-10-CM | POA: Diagnosis not present

## 2019-09-23 DIAGNOSIS — E2839 Other primary ovarian failure: Secondary | ICD-10-CM

## 2019-09-23 DIAGNOSIS — E782 Mixed hyperlipidemia: Secondary | ICD-10-CM

## 2019-09-23 NOTE — Progress Notes (Signed)
This visit occurred during the SARS-CoV-2 public health emergency.  Safety protocols were in place, including screening questions prior to the visit, additional usage of staff PPE, and extensive cleaning of exam room while observing appropriate contact time as indicated for disinfecting solutions.  Subjective:     Patient ID: Janet Barber , female    DOB: March 20, 1941 , 79 y.o.   MRN: [id number]   Chief Complaint  Patient presents with  . Hypertension    HPI     Hypertension This is a chronic problem. The current episode started more than 1 year ago. The problem has been gradually improving since onset. The problem is controlled. Pertinent negatives include no anxiety, blurred vision, chest pain, headaches or shortness of breath. There are no associated agents to hypertension. Risk factors for coronary artery disease include obesity. There are no compliance problems.  There is no history of angina.     Past Medical History:  Diagnosis Date  . Allergic rhinitis   . Anxiety   . Colon cancer ([place])   . Diverticulosis of colon   . History of hemorrhoids   . Hx of colonic polyps   . Hypercholesterolemia   . Hypertension   . Hypothyroidism   . Lumbar back pain   . Mitral valve prolapse   . Overweight(278.02)   . Venous insufficiency   . Vitamin D deficiency      Family History  Problem Relation Age of Onset  . Hypertension Maternal Grandmother      Current Outpatient Medications:  .  aspirin 81 MG chewable tablet, Chew 81 mg by mouth daily., Disp: , Rfl:  .  atorvastatin (LIPITOR) 10 MG tablet, TAKE 1 TABLET BY MOUTH EVERY DAY, Disp: 90 tablet, Rfl: 0 .  carvedilol (COREG) 6.25 MG tablet, Take 1 tablet (6.25 mg total) by mouth 2 (two) times daily., Disp: 180 tablet, Rfl: 3 .  hydrochlorothiazide (HYDRODIURIL) 25 MG tablet, TAKE 1 TABLET BY MOUTH EVERY DAY, Disp: 90 tablet, Rfl: 0 .  hydrocortisone cream 1 %, APPLY TO AFFECTED AREA THREE TIMES A DAY IF NEEDED IF NEEDED,  Disp: 28.35 g, Rfl: 0 .  levothyroxine (SYNTHROID) 25 MCG tablet, TAKE 1 TABLET(25 MCG) BY MOUTH DAILY, Disp: 90 tablet, Rfl: 0 .  NON FORMULARY, Allergy shot 1 time a month, Disp: , Rfl:  .  valsartan (DIOVAN) 320 MG tablet, TAKE 1 TABLET BY MOUTH EVERY DAY, Disp: 90 tablet, Rfl: 0 .  vitamin B-12 (CYANOCOBALAMIN) 250 MCG tablet, Take 250 mcg by mouth daily., Disp: , Rfl:    Allergies  Allergen Reactions  . Shellfish Allergy Itching  . Penicillins     REACTION: unsure of reaction---years ago     Review of Systems  Constitutional: Negative.   Eyes: Negative for blurred vision and visual disturbance.  Respiratory: Negative.  Negative for shortness of breath.   Cardiovascular: Negative for chest pain.  Gastrointestinal: Negative.   Endocrine: Negative.   Musculoskeletal: Negative.   Skin: Negative.   Neurological: Negative for dizziness, weakness and headaches.  Psychiatric/Behavioral: Negative.  Negative for confusion. The patient is not nervous/anxious.      Today's Vitals   09/23/19 0910  BP: 134/88  Pulse: 99  Temp: 98.6 F (37 C)  TempSrc: Oral  Weight: 182 lb 3.2 oz (82.6 kg)  Height: _0  (1.651 m)  PainSc: 0-No pain   Body mass index is 30.32 kg/m.   Objective:  Physical Exam Vitals reviewed.  Constitutional:      General:  She is not in acute distress.    Appearance: Normal appearance. She is well-developed.  HENT:     Head: Normocephalic and atraumatic.  Eyes:     Extraocular Movements: Extraocular movements intact.     Conjunctiva/sclera: Conjunctivae normal.     Pupils: Pupils are equal, round, and reactive to light.  Cardiovascular:     Rate and Rhythm: Normal rate and regular rhythm.     Pulses: Normal pulses.     Heart sounds: Normal heart sounds. No murmur.  Pulmonary:     Effort: Pulmonary effort is normal. No respiratory distress.     Breath sounds: Normal breath sounds.  Skin:    Capillary Refill: Capillary refill takes less than 2 seconds.   Neurological:     General: No focal deficit present.     Mental Status: She is alert and oriented to person, place, and time.     Cranial Nerves: No cranial nerve deficit.  Psychiatric:        Mood and Affect: Mood normal.        Behavior: Behavior normal.        Thought Content: Thought content normal.        Judgment: Judgment normal.         Assessment And Plan:     1. Essential hypertension . B/P is controlled.  . CMP ordered to check renal function.  . The importance of regular exercise and dietary modification was stressed to the patient.  - CMP14+EGFR  2. Hypothyroidism, unspecified type  Chronic, controlled  Continue with current medications - TSH - T4 - T3, free  3. Mixed hyperlipidemia  Chronic, controlled  Continue with current medications - Lipid Profile  4. Vitamin D deficiency  Will check vitamin D level and supplement as needed.     Also encouraged to spend 15 minutes in the sun daily.  - Vitamin D (25 hydroxy)  5. Decreased estrogen level  Resent order for bone density - DG Bone Density; Future   [name], FNP    THE PATIENT IS ENCOURAGED TO PRACTICE SOCIAL DISTANCING DUE TO THE COVID-19 PANDEMIC.

## 2019-09-24 LAB — CMP14+EGFR
ALT: 17 IU/L (ref 0–32)
AST: 22 IU/L (ref 0–40)
Albumin/Globulin Ratio: 1.4 (ref 1.2–2.2)
Albumin: 4 g/dL (ref 3.7–4.7)
Alkaline Phosphatase: 92 IU/L (ref 39–117)
BUN/Creatinine Ratio: 12 (ref 12–28)
BUN: 12 mg/dL (ref 8–27)
Bilirubin Total: 0.7 mg/dL (ref 0.0–1.2)
CO2: 26 mmol/L (ref 20–29)
Calcium: 9.5 mg/dL (ref 8.7–10.3)
Chloride: 106 mmol/L (ref 96–106)
Creatinine, Ser: 0.98 mg/dL (ref 0.57–1.00)
GFR calc Af Amer: 64 mL/min/{1.73_m2} (ref >59)
GFR calc non Af Amer: 56 mL/min/{1.73_m2} — ABNORMAL LOW (ref >59)
Globulin, Total: 2.8 g/dL (ref 1.5–4.5)
Glucose: 95 mg/dL (ref 65–99)
Potassium: 3.6 mmol/L (ref 3.5–5.2)
Sodium: 144 mmol/L (ref 134–144)
Total Protein: 6.8 g/dL (ref 6.0–8.5)

## 2019-09-24 LAB — LIPID PANEL
Chol/HDL Ratio: 3.5 ratio (ref 0.0–4.4)
Cholesterol, Total: 187 mg/dL (ref 100–199)
HDL: 53 mg/dL (ref >39)
LDL Chol Calc (NIH): 118 mg/dL — ABNORMAL HIGH (ref 0–99)
Triglycerides: 86 mg/dL (ref 0–149)
VLDL Cholesterol Cal: 16 mg/dL (ref 5–40)

## 2019-09-24 LAB — T3, FREE: T3, Free: 2.9 pg/mL (ref 2.0–4.4)

## 2019-09-24 LAB — T4: T4, Total: 8.6 ug/dL (ref 4.5–12.0)

## 2019-09-24 LAB — TSH: TSH: 4.96 u[IU]/mL — ABNORMAL HIGH (ref 0.450–4.500)

## 2019-09-24 LAB — VITAMIN D 25 HYDROXY (VIT D DEFICIENCY, FRACTURES): Vit D, 25-Hydroxy: 33.9 ng/mL (ref 30.0–100.0)

## 2019-10-21 DIAGNOSIS — J301 Allergic rhinitis due to pollen: Secondary | ICD-10-CM | POA: Diagnosis not present

## 2019-10-21 DIAGNOSIS — J3089 Other allergic rhinitis: Secondary | ICD-10-CM | POA: Diagnosis not present

## 2019-10-28 DIAGNOSIS — J301 Allergic rhinitis due to pollen: Secondary | ICD-10-CM | POA: Diagnosis not present

## 2019-10-28 DIAGNOSIS — J3089 Other allergic rhinitis: Secondary | ICD-10-CM | POA: Diagnosis not present

## 2019-10-30 ENCOUNTER — Other Ambulatory Visit: Payer: Self-pay

## 2019-11-18 DIAGNOSIS — J3089 Other allergic rhinitis: Secondary | ICD-10-CM | POA: Diagnosis not present

## 2019-11-18 DIAGNOSIS — J301 Allergic rhinitis due to pollen: Secondary | ICD-10-CM | POA: Diagnosis not present

## 2019-11-20 DIAGNOSIS — J3089 Other allergic rhinitis: Secondary | ICD-10-CM | POA: Diagnosis not present

## 2019-11-20 DIAGNOSIS — J301 Allergic rhinitis due to pollen: Secondary | ICD-10-CM | POA: Diagnosis not present

## 2019-12-11 ENCOUNTER — Other Ambulatory Visit: Payer: Self-pay | Admitting: Nurse Practitioner

## 2019-12-16 ENCOUNTER — Other Ambulatory Visit: Payer: Medicare Other

## 2019-12-16 ENCOUNTER — Ambulatory Visit: Payer: Medicare Other

## 2019-12-23 ENCOUNTER — Encounter: Payer: Self-pay | Admitting: Nurse Practitioner

## 2019-12-23 ENCOUNTER — Other Ambulatory Visit: Payer: Self-pay

## 2019-12-23 ENCOUNTER — Ambulatory Visit (INDEPENDENT_AMBULATORY_CARE_PROVIDER_SITE_OTHER): Payer: Medicare HMO | Admitting: Nurse Practitioner

## 2019-12-23 VITALS — BP 124/82 | HR 75 | Temp 98.8°F | Ht 63.4 in | Wt 180.0 lb

## 2019-12-23 DIAGNOSIS — E782 Mixed hyperlipidemia: Secondary | ICD-10-CM | POA: Diagnosis not present

## 2019-12-23 DIAGNOSIS — E559 Vitamin D deficiency, unspecified: Secondary | ICD-10-CM | POA: Diagnosis not present

## 2019-12-23 DIAGNOSIS — I10 Essential (primary) hypertension: Secondary | ICD-10-CM | POA: Diagnosis not present

## 2019-12-23 DIAGNOSIS — E079 Disorder of thyroid, unspecified: Secondary | ICD-10-CM

## 2019-12-23 DIAGNOSIS — E039 Hypothyroidism, unspecified: Secondary | ICD-10-CM | POA: Diagnosis not present

## 2019-12-23 MED ORDER — LEVOTHYROXINE SODIUM 25 MCG PO TABS: ORAL_TABLET | ORAL | 0 refills | Status: DC

## 2019-12-23 NOTE — Progress Notes (Signed)
This visit occurred during the SARS-CoV-2 public health emergency.  Safety protocols were in place, including screening questions prior to the visit, additional usage of staff PPE, and extensive cleaning of exam room while observing appropriate contact time as indicated for disinfecting solutions.  Subjective:     Patient ID: Janet Barber , female    DOB: 1941/02/27 , 79 y.o.   MRN: [id number]   Chief Complaint  Patient presents with  . Diabetes  . Hypertension    HPI  Wt Readings from Last 3 Encounters: 12/23/19 : 180 lb (81.6 kg) 09/23/19 : 182 lb 3.2 oz (82.6 kg) 06/19/19 : 185 lb 6.4 oz (84.1 kg)  She is walking 3 days [place] the mall.   She is tolerating her cholesterol medication well  She is concerned about a bulging in her abdomen from a previous hernia she had her naval to be reinserted and she is aware of the bulge. She does not plan to have anything done at this time. Denies pain.   Hypertension This is a new problem. The current episode started more than 1 year ago. The problem is controlled. Pertinent negatives include no anxiety or headaches. There are no associated agents to hypertension. Risk factors for coronary artery disease include post-menopausal state. Past treatments include diuretics, beta blockers and angiotensin blockers. Improvement on treatment: she is urinating 2 times a night from her HCTZ. There are no compliance problems.  There is no history of kidney disease. Identifiable causes of hypertension include a thyroid problem.  Thyroid Problem Presents for follow-up visit. Patient reports no anxiety or fatigue. The symptoms have been resolved.     Past Medical History:  Diagnosis Date  . Allergic rhinitis   . Anxiety   . Colon cancer ([place])   . Diverticulosis of colon   . History of hemorrhoids   . Hx of colonic polyps   . Hypercholesterolemia   . Hypertension   . Hypothyroidism   . Lumbar back pain   . Mitral valve prolapse   .  Overweight(278.02)   . Venous insufficiency   . Vitamin D deficiency      Family History  Problem Relation Age of Onset  . Hypertension Maternal Grandmother      Current Outpatient Medications:  .  aspirin 81 MG chewable tablet, Chew 81 mg by mouth daily., Disp: , Rfl:  .  atorvastatin (LIPITOR) 10 MG tablet, TAKE 1 TABLET BY MOUTH EVERY DAY, Disp: 90 tablet, Rfl: 0 .  carvedilol (COREG) 6.25 MG tablet, Take 1 tablet (6.25 mg total) by mouth 2 (two) times daily., Disp: 180 tablet, Rfl: 3 .  hydrochlorothiazide (HYDRODIURIL) 25 MG tablet, TAKE 1 TABLET BY MOUTH EVERY DAY, Disp: 90 tablet, Rfl: 0 .  hydrocortisone cream 1 %, APPLY TO AFFECTED AREA THREE TIMES A DAY IF NEEDED IF NEEDED, Disp: 28.35 g, Rfl: 0 .  levothyroxine (SYNTHROID) 25 MCG tablet, TAKE 1 TABLET(25 MCG) BY MOUTH DAILY, Disp: 90 tablet, Rfl: 0 .  NON FORMULARY, Allergy shot 1 time a month, Disp: , Rfl:  .  valsartan (DIOVAN) 320 MG tablet, TAKE 1 TABLET BY MOUTH EVERY DAY, Disp: 90 tablet, Rfl: 0 .  vitamin B-12 (CYANOCOBALAMIN) 250 MCG tablet, Take 250 mcg by mouth daily., Disp: , Rfl:    Allergies  Allergen Reactions  . Shellfish Allergy Itching  . Penicillins     REACTION: unsure of reaction---years ago     Review of Systems  Constitutional: Negative for fatigue.  Neurological: Negative  for headaches.  Psychiatric/Behavioral: The patient is not nervous/anxious.      Today's Vitals   12/23/19 0922  BP: 124/82  Pulse: 75  Temp: 98.8 F (37.1 C)  TempSrc: Oral  Weight: 180 lb (81.6 kg)  Height: 5' 3.4" (1.61 m)  PainSc: 0-No pain   Body mass index is 31.48 kg/m.   Objective:  Physical Exam Vitals reviewed.  Constitutional:      General: She is not in acute distress.    Appearance: Normal appearance. She is well-developed. She is obese.  HENT:     Head: Normocephalic and atraumatic.  Eyes:     Extraocular Movements: Extraocular movements intact.     Conjunctiva/sclera: Conjunctivae normal.      Pupils: Pupils are equal, round, and reactive to light.  Cardiovascular:     Rate and Rhythm: Normal rate and regular rhythm.     Pulses: Normal pulses.     Heart sounds: Normal heart sounds. No murmur.  Pulmonary:     Effort: Pulmonary effort is normal. No respiratory distress.     Breath sounds: Normal breath sounds.  Skin:    Capillary Refill: Capillary refill takes less than 2 seconds.  Neurological:     General: No focal deficit present.     Mental Status: She is alert and oriented to person, place, and time.     Cranial Nerves: No cranial nerve deficit.  Psychiatric:        Mood and Affect: Mood normal.        Behavior: Behavior normal.        Thought Content: Thought content normal.        Judgment: Judgment normal.         Assessment And Plan:     1. Essential hypertension  Chronic, improving  Will decrease her HCTZ to 12.5 mg she can take 1/2 tablet daily.   - CMP14+EGFR  2. Hypothyroidism, unspecified type  Chronic, stable  Will check thyroid panel  - TSH - T3, free - T4  3. Mixed hyperlipidemia  Chronic, stable - Lipid panel  4. Vitamin D deficiency  Will check vitamin D level and supplement as needed.     Also encouraged to spend 15 minutes in the sun daily.     [name], FNP    THE PATIENT IS ENCOURAGED TO PRACTICE SOCIAL DISTANCING DUE TO THE COVID-19 PANDEMIC.

## 2019-12-24 LAB — T4: T4, Total: 8.1 ug/dL (ref 4.5–12.0)

## 2019-12-24 LAB — CMP14+EGFR
ALT: 13 IU/L (ref 0–32)
AST: 22 IU/L (ref 0–40)
Albumin/Globulin Ratio: 1.5 (ref 1.2–2.2)
Albumin: 4 g/dL (ref 3.7–4.7)
Alkaline Phosphatase: 83 IU/L (ref 39–117)
BUN/Creatinine Ratio: 18 (ref 12–28)
BUN: 17 mg/dL (ref 8–27)
Bilirubin Total: 0.7 mg/dL (ref 0.0–1.2)
CO2: 24 mmol/L (ref 20–29)
Calcium: 9.6 mg/dL (ref 8.7–10.3)
Chloride: 105 mmol/L (ref 96–106)
Creatinine, Ser: 0.97 mg/dL (ref 0.57–1.00)
GFR calc Af Amer: 65 mL/min/{1.73_m2} (ref >59)
GFR calc non Af Amer: 56 mL/min/{1.73_m2} — ABNORMAL LOW (ref >59)
Globulin, Total: 2.6 g/dL (ref 1.5–4.5)
Glucose: 95 mg/dL (ref 65–99)
Potassium: 3.5 mmol/L (ref 3.5–5.2)
Sodium: 143 mmol/L (ref 134–144)
Total Protein: 6.6 g/dL (ref 6.0–8.5)

## 2019-12-24 LAB — LIPID PANEL
Chol/HDL Ratio: 4.1 ratio (ref 0.0–4.4)
Cholesterol, Total: 217 mg/dL — ABNORMAL HIGH (ref 100–199)
HDL: 53 mg/dL (ref >39)
LDL Chol Calc (NIH): 146 mg/dL — ABNORMAL HIGH (ref 0–99)
Triglycerides: 100 mg/dL (ref 0–149)
VLDL Cholesterol Cal: 18 mg/dL (ref 5–40)

## 2019-12-24 LAB — TSH: TSH: 3.45 u[IU]/mL (ref 0.450–4.500)

## 2019-12-24 LAB — T3, FREE: T3, Free: 2.6 pg/mL (ref 2.0–4.4)

## 2019-12-25 DIAGNOSIS — J301 Allergic rhinitis due to pollen: Secondary | ICD-10-CM | POA: Diagnosis not present

## 2019-12-25 DIAGNOSIS — J3089 Other allergic rhinitis: Secondary | ICD-10-CM | POA: Diagnosis not present

## 2019-12-29 DIAGNOSIS — J301 Allergic rhinitis due to pollen: Secondary | ICD-10-CM | POA: Diagnosis not present

## 2019-12-29 DIAGNOSIS — J3089 Other allergic rhinitis: Secondary | ICD-10-CM | POA: Diagnosis not present

## 2020-01-01 DIAGNOSIS — J301 Allergic rhinitis due to pollen: Secondary | ICD-10-CM | POA: Diagnosis not present

## 2020-01-01 DIAGNOSIS — J3089 Other allergic rhinitis: Secondary | ICD-10-CM | POA: Diagnosis not present

## 2020-01-06 DIAGNOSIS — J3089 Other allergic rhinitis: Secondary | ICD-10-CM | POA: Diagnosis not present

## 2020-01-06 DIAGNOSIS — J301 Allergic rhinitis due to pollen: Secondary | ICD-10-CM | POA: Diagnosis not present

## 2020-01-09 DIAGNOSIS — J3089 Other allergic rhinitis: Secondary | ICD-10-CM | POA: Diagnosis not present

## 2020-01-09 DIAGNOSIS — J301 Allergic rhinitis due to pollen: Secondary | ICD-10-CM | POA: Diagnosis not present

## 2020-01-20 ENCOUNTER — Ambulatory Visit: Payer: Medicare HMO | Admitting: Nurse Practitioner

## 2020-01-22 ENCOUNTER — Encounter: Payer: Self-pay | Admitting: Nurse Practitioner

## 2020-01-22 ENCOUNTER — Other Ambulatory Visit: Payer: Self-pay

## 2020-01-22 ENCOUNTER — Ambulatory Visit (INDEPENDENT_AMBULATORY_CARE_PROVIDER_SITE_OTHER): Payer: Medicare HMO

## 2020-01-22 ENCOUNTER — Ambulatory Visit (INDEPENDENT_AMBULATORY_CARE_PROVIDER_SITE_OTHER): Payer: Medicare HMO | Admitting: Nurse Practitioner

## 2020-01-22 VITALS — BP 150/90 | HR 118 | Temp 98.2°F | Ht 63.6 in | Wt 179.0 lb

## 2020-01-22 DIAGNOSIS — I10 Essential (primary) hypertension: Secondary | ICD-10-CM

## 2020-01-22 DIAGNOSIS — Z0000 Encounter for general adult medical examination without abnormal findings: Secondary | ICD-10-CM

## 2020-01-22 DIAGNOSIS — R351 Nocturia: Secondary | ICD-10-CM | POA: Diagnosis not present

## 2020-01-22 NOTE — Progress Notes (Signed)
This visit occurred during the SARS-CoV-2 public health emergency.  Safety protocols were in place, including screening questions prior to the visit, additional usage of staff PPE, and extensive cleaning of exam room while observing appropriate contact time as indicated for disinfecting solutions.  Subjective:   Janet Barber is a 79 y.o. female who presents for Medicare Annual (Subsequent) preventive examination.  Review of Systems:  n/a Cardiac Risk Factors include: advanced age (>10men, >53 women);hypertension     Objective:     Vitals: BP (!) 150/90 (BP Location: Left Arm, Patient Position: Sitting, Cuff Size: Normal)   Pulse (!) 118   Temp 98.2 F (36.8 C) (Oral)   Ht 5' 3.6" (1.615 m)   Wt 179 lb (81.2 kg)   SpO2 98%   BMI 31.11 kg/m   Body mass index is 31.11 kg/m.  Advanced Directives 01/22/2020 06/19/2019 08/13/2018 04/02/2018 03/26/2018 10/03/2016  Does Patient Have a Medical Advance Directive? No No No No No No  Would patient like information on creating a medical advance directive? No - Patient declined Yes (MAU/Ambulatory/Procedural Areas - Information given) No - Patient declined No - Patient declined - No - Patient declined    Tobacco Social History   Tobacco Use  Smoking Status Former Smoker  . Types: Cigarettes  . Quit date: 09/18/1986  . Years since quitting: 33.3  Smokeless Tobacco Never Used     Counseling given: Not Answered   Clinical Intake:  Pre-visit preparation completed: Yes  Pain : No/denies pain     Nutritional Status: BMI > 30  Obese Nutritional Risks: None Diabetes: No  How often do you need to have someone help you when you read instructions, pamphlets, or other written materials from your doctor or pharmacy?: 1 - Never What is the last grade level you completed in school?: 2 yrs college  Interpreter Needed?: No  Information entered by :: NAllen LPN  Past Medical History:  Diagnosis Date  . Allergic rhinitis   . Anxiety     . Colon cancer ([place])   . Diverticulosis of colon   . History of hemorrhoids   . Hx of colonic polyps   . Hypercholesterolemia   . Hypertension   . Hypothyroidism   . Lumbar back pain   . Mitral valve prolapse   . Overweight(278.02)   . Venous insufficiency   . Vitamin D deficiency    Past Surgical History:  Procedure Laterality Date  . ABDOMINAL HYSTERECTOMY  1974  . benign breast biopsy  2006  . BREAST REDUCTION SURGERY Bilateral 04/02/2018   Procedure: MAMMARY REDUCTION  (BREAST);  Surgeon: Irene Limbo, MD;  Location: [facility];  Service: Plastics;  Laterality: Bilateral;  . MASS EXCISION Left 04/02/2018   Procedure: EXCISION LEFT BREAST MASS;  Surgeon: Irene Limbo, MD;  Location: [facility];  Service: Plastics;  Laterality: Left;  . UMBILICAL HERNIA REPAIR  1988   Family History  Problem Relation Age of Onset  . Hypertension Maternal Grandmother    Social History   Socioeconomic History  . Marital status: Widowed    Spouse name: Not on file  . Number of children: 2  . Years of education: Not on file  . Highest education level: Not on file  Occupational History  . Occupation: retired  Tobacco Use  . Smoking status: Former Smoker    Types: Cigarettes    Quit date: 09/18/1986    Years since quitting: 33.3  . Smokeless tobacco: Never Used  Substance  and Sexual Activity  . Alcohol use: No  . Drug use: No  . Sexual activity: Not Currently  Other Topics Concern  . Not on file  Social History Narrative  . Not on file   Social Determinants of Health   Financial Resource Strain: Low Risk   . Difficulty of Paying Living Expenses: Not hard at all  Food Insecurity: No Food Insecurity  . Worried About Charity fundraiser in the Last Year: Never true  . Ran Out of Food in the Last Year: Never true  Transportation Needs: No Transportation Needs  . Lack of Transportation (Medical): No  . Lack of Transportation (Non-Medical): No   Physical Activity: Insufficiently Active  . Days of Exercise per Week: 3 days  . Minutes of Exercise per Session: 30 min  Stress: No Stress Concern Present  . Feeling of Stress : Not at all  Social Connections:   . Frequency of Communication with Friends and Family:   . Frequency of Social Gatherings with Friends and Family:   . Attends Religious Services:   . Active Member of Clubs or Organizations:   . Attends Archivist Meetings:   Marland Kitchen Marital Status:     Outpatient Encounter Medications as of 01/22/2020  Medication Sig  . aspirin 81 MG chewable tablet Chew 81 mg by mouth daily.  Marland Kitchen atorvastatin (LIPITOR) 10 MG tablet TAKE 1 TABLET BY MOUTH EVERY DAY  . carvedilol (COREG) 6.25 MG tablet Take 1 tablet (6.25 mg total) by mouth 2 (two) times daily.  . hydrochlorothiazide (HYDRODIURIL) 25 MG tablet TAKE 1 TABLET BY MOUTH EVERY DAY  . hydrocortisone cream 1 % APPLY TO AFFECTED AREA THREE TIMES A DAY IF NEEDED IF NEEDED  . levothyroxine (SYNTHROID) 25 MCG tablet TAKE 1 TABLET(25 MCG) BY MOUTH DAILY  . NON FORMULARY Allergy shot 1 time a month  . valsartan (DIOVAN) 320 MG tablet TAKE 1 TABLET BY MOUTH EVERY DAY  . vitamin B-12 (CYANOCOBALAMIN) 250 MCG tablet Take 250 mcg by mouth daily.   No facility-administered encounter medications on file as of 01/22/2020.    Activities of Daily Living In your present state of health, do you have any difficulty performing the following activities: 01/22/2020 06/19/2019  Hearing? N N  Vision? N N  Difficulty concentrating or making decisions? N N  Walking or climbing stairs? N N  Dressing or bathing? N N  Doing errands, shopping? N N  Preparing Food and eating ? N N  Using the Toilet? N N  In the past six months, have you accidently leaked urine? N N  Do you have problems with loss of bowel control? N N  Managing your Medications? N N  Managing your Finances? N N  Housekeeping or managing your Housekeeping? N N  Some recent data might be  hidden    Patient Care Team: [name], FNP as PCP - General (General Practice)    Assessment:   This is a routine wellness examination for Janet Barber.  Exercise Activities and Dietary recommendations Current Exercise Habits: Home exercise routine, Type of exercise: walking, Time (Minutes): 30, Frequency (Times/Week): 3, Weekly Exercise (Minutes/Week): 90  Goals    . Patient Stated     01/22/2020, no goals    . Weight (lb) < 200 lb (90.7 kg)     06/19/2019, would like to 10 pounds       Fall Risk Fall Risk  01/22/2020 12/23/2019 09/23/2019 06/19/2019 03/19/2019  Falls in the past year? 0 0  0 0 0  Number falls in past yr: - - - 0 -  Injury with Fall? - - - - -  Risk for fall due to : Medication side effect - - Medication side effect -  Follow up Falls evaluation completed;Education provided;Falls prevention discussed - - Falls evaluation completed;Education provided;Falls prevention discussed -   Is the patient's home free of loose throw rugs in walkways, pet beds, electrical cords, etc?   yes      Grab bars in the bathroom? no      Handrails on the stairs?   yes      Adequate lighting?   yes  Timed Get Up and Go performed: n/a  Depression Screen PHQ 2/9 Scores 01/22/2020 12/23/2019 09/23/2019 06/19/2019  PHQ - 2 Score 0 0 0 0  PHQ- 9 Score 0 - - 0     Cognitive Function     6CIT Screen 01/22/2020 06/19/2019 08/13/2018  What Year? 0 points 0 points 0 points  What month? 0 points 0 points 0 points  What time? 0 points 0 points 0 points  Count back from 20 0 points 0 points 0 points  Months in reverse 0 points 0 points 0 points  Repeat phrase 2 points 0 points 6 points  Total Score 2 0 6    Immunization History  Administered Date(s) Administered  . Influenza Whole 06/03/2008, 07/08/2009  . Influenza-Unspecified 07/18/2011, 06/18/2018, 06/16/2019  . Moderna SARS-COVID-2 Vaccination 10/14/2019, 11/11/2019  . Pneumococcal Polysaccharide-23 06/09/2009  . Pneumococcal-Unspecified  06/21/2019  . Tdap 05/06/2013  . Zoster Recombinat (Shingrix) 06/21/2019, 08/29/2019    Qualifies for Shingles Vaccine? yes  Screening Tests Health Maintenance  Topic Date Due  . DEXA SCAN  Never done  . INFLUENZA VACCINE  04/18/2020  . PNA vac Low Risk Adult (2 of 2 - PCV13) 06/20/2020  . TETANUS/TDAP  05/07/2023  . COVID-19 Vaccine  Completed    Cancer Screenings: Lung: Low Dose CT Chest recommended if Age 31-80 years, 30 pack-year currently smoking OR have quit w/in 15years. Patient does not qualify. Breast:  Up to date on Mammogram? Yes   Up to date of Bone Density/Dexa? No Colorectal: not required  Additional Screenings: : Hepatitis C Screening: n/a     Plan:    Patient has no goals set at this time.   I have personally reviewed and noted the following in the patient's chart:   . Medical and social history . Use of alcohol, tobacco or illicit drugs  . Current medications and supplements . Functional ability and status . Nutritional status . Physical activity . Advanced directives . List of other physicians . Hospitalizations, surgeries, and ER visits in previous 12 months . Vitals . Screenings to include cognitive, depression, and falls . Referrals and appointments  In addition, I have reviewed and discussed with patient certain preventive protocols, quality metrics, and best practice recommendations. A written personalized care plan for preventive services as well as general preventive health recommendations were provided to patient.     Kellie Simmering, LPN  [id number]

## 2020-01-22 NOTE — Patient Instructions (Signed)
Janet Barber , Thank you for taking time to come for your Medicare Wellness Visit. I appreciate your ongoing commitment to your health goals. Please review the following plan we discussed and let me know if I can assist you in the future.   Screening recommendations/referrals: Colonoscopy: not required Mammogram: not required Bone Density: to be scheduled Recommended yearly ophthalmology/optometry visit for glaucoma screening and checkup Recommended yearly dental visit for hygiene and checkup  Vaccinations: Influenza vaccine: 05/2019 Pneumococcal vaccine: 06/2019 Tdap vaccine: 04/2013 Shingles vaccine: discussed    Advanced directives: Advance directive discussed with you today. Even though you declined this today please call our office should you change your mind and we can give you the proper paperwork for you to fill out.  Conditions/risks identified: obesity  Next appointment: 06/29/2020 at 9:15   Preventive Care 78 Years and Older, Female Preventive care refers to lifestyle choices and visits with your health care provider that can promote health and wellness. What does preventive care include?  A yearly physical exam. This is also called an annual well check.  Dental exams once or twice a year.  Routine eye exams. Ask your health care provider how often you should have your eyes checked.  Personal lifestyle choices, including:  Daily care of your teeth and gums.  Regular physical activity.  Eating a healthy diet.  Avoiding tobacco and drug use.  Limiting alcohol use.  Practicing safe sex.  Taking low-dose aspirin every day.  Taking vitamin and mineral supplements as recommended by your health care provider. What happens during an annual well check? The services and screenings done by your health care provider during your annual well check will depend on your age, overall health, lifestyle risk factors, and family history of disease. Counseling  Your health care  provider may ask you questions about your:  Alcohol use.  Tobacco use.  Drug use.  Emotional well-being.  Home and relationship well-being.  Sexual activity.  Eating habits.  History of falls.  Memory and ability to understand (cognition).  Work and work Statistician.  Reproductive health. Screening  You may have the following tests or measurements:  Height, weight, and BMI.  Blood pressure.  Lipid and cholesterol levels. These may be checked every 5 years, or more frequently if you are over 110 years old.  Skin check.  Lung cancer screening. You may have this screening every year starting at age 58 if you have a 30-pack-year history of smoking and currently smoke or have quit within the past 15 years.  Fecal occult blood test (FOBT) of the stool. You may have this test every year starting at age 9.  Flexible sigmoidoscopy or colonoscopy. You may have a sigmoidoscopy every 5 years or a colonoscopy every 10 years starting at age 88.  Hepatitis C blood test.  Hepatitis B blood test.  Sexually transmitted disease (STD) testing.  Diabetes screening. This is done by checking your blood sugar (glucose) after you have not eaten for a while (fasting). You may have this done every 1-3 years.  Bone density scan. This is done to screen for osteoporosis. You may have this done starting at age 90.  Mammogram. This may be done every 1-2 years. Talk to your health care provider about how often you should have regular mammograms. Talk with your health care provider about your test results, treatment options, and if necessary, the need for more tests. Vaccines  Your health care provider may recommend certain vaccines, such as:  Influenza vaccine. This  is recommended every year.  Tetanus, diphtheria, and acellular pertussis (Tdap, Td) vaccine. You may need a Td booster every 10 years.  Zoster vaccine. You may need this after age 23.  Pneumococcal 13-valent conjugate (PCV13)  vaccine. One dose is recommended after age 79.  Pneumococcal polysaccharide (PPSV23) vaccine. One dose is recommended after age 48. Talk to your health care provider about which screenings and vaccines you need and how often you need them. This information is not intended to replace advice given to you by your health care provider. Make sure you discuss any questions you have with your health care provider. Document Released: 10/01/2015 Document Revised: 05/24/2016 Document Reviewed: 07/06/2015 Elsevier Interactive Patient Education  2017 [place] Prevention in the Home Falls can cause injuries. They can happen to people of all ages. There are many things you can do to make your home safe and to help prevent falls. What can I do on the outside of my home?  Regularly fix the edges of walkways and driveways and fix any cracks.  Remove anything that might make you trip as you walk through a door, such as a raised step or threshold.  Trim any bushes or trees on the path to your home.  Use bright outdoor lighting.  Clear any walking paths of anything that might make someone trip, such as rocks or tools.  Regularly check to see if handrails are loose or broken. Make sure that both sides of any steps have handrails.  Any raised decks and porches should have guardrails on the edges.  Have any leaves, snow, or ice cleared regularly.  Use sand or salt on walking paths during winter.  Clean up any spills in your garage right away. This includes oil or grease spills. What can I do in the bathroom?  Use night lights.  Install grab bars by the toilet and in the tub and shower. Do not use towel bars as grab bars.  Use non-skid mats or decals in the tub or shower.  If you need to sit down in the shower, use a plastic, non-slip stool.  Keep the floor dry. Clean up any water that spills on the floor as soon as it happens.  Remove soap buildup in the tub or shower  regularly.  Attach bath mats securely with double-sided non-slip rug tape.  Do not have throw rugs and other things on the floor that can make you trip. What can I do in the bedroom?  Use night lights.  Make sure that you have a light by your bed that is easy to reach.  Do not use any sheets or blankets that are too big for your bed. They should not hang down onto the floor.  Have a firm chair that has side arms. You can use this for support while you get dressed.  Do not have throw rugs and other things on the floor that can make you trip. What can I do in the kitchen?  Clean up any spills right away.  Avoid walking on wet floors.  Keep items that you use a lot in easy-to-reach places.  If you need to reach something above you, use a strong step stool that has a grab bar.  Keep electrical cords out of the way.  Do not use floor polish or wax that makes floors slippery. If you must use wax, use non-skid floor wax.  Do not have throw rugs and other things on the floor that can make you  trip. What can I do with my stairs?  Do not leave any items on the stairs.  Make sure that there are handrails on both sides of the stairs and use them. Fix handrails that are broken or loose. Make sure that handrails are as long as the stairways.  Check any carpeting to make sure that it is firmly attached to the stairs. Fix any carpet that is loose or worn.  Avoid having throw rugs at the top or bottom of the stairs. If you do have throw rugs, attach them to the floor with carpet tape.  Make sure that you have a light switch at the top of the stairs and the bottom of the stairs. If you do not have them, ask someone to add them for you. What else can I do to help prevent falls?  Wear shoes that:  Do not have high heels.  Have rubber bottoms.  Are comfortable and fit you well.  Are closed at the toe. Do not wear sandals.  If you use a stepladder:  Make sure that it is fully  opened. Do not climb a closed stepladder.  Make sure that both sides of the stepladder are locked into place.  Ask someone to hold it for you, if possible.  Clearly mark and make sure that you can see:  Any grab bars or handrails.  First and last steps.  Where the edge of each step is.  Use tools that help you move around (mobility aids) if they are needed. These include:  Canes.  Walkers.  Scooters.  Crutches.  Turn on the lights when you go into a dark area. Replace any light bulbs as soon as they burn out.  Set up your furniture so you have a clear path. Avoid moving your furniture around.  If any of your floors are uneven, fix them.  If there are any pets around you, be aware of where they are.  Review your medicines with your doctor. Some medicines can make you feel dizzy. This can increase your chance of falling. Ask your doctor what other things that you can do to help prevent falls. This information is not intended to replace advice given to you by your health care provider. Make sure you discuss any questions you have with your health care provider. Document Released: 07/01/2009 Document Revised: 02/10/2016 Document Reviewed: 10/09/2014 Elsevier Interactive Patient Education  2017 [employer].

## 2020-01-22 NOTE — Patient Instructions (Signed)
[facility] or advanced home care for the cuff   Kegel Exercises  Kegel exercises can help strengthen your pelvic floor muscles. The pelvic floor is a group of muscles that support your rectum, small intestine, and bladder. In females, pelvic floor muscles also help support the womb (uterus). These muscles help you control the flow of urine and stool. Kegel exercises are painless and simple, and they do not require any equipment. Your provider may suggest Kegel exercises to:  Improve bladder and bowel control.  Improve sexual response.  Improve weak pelvic floor muscles after surgery to remove the uterus (hysterectomy) or pregnancy (females).  Improve weak pelvic floor muscles after prostate gland removal or surgery (males). Kegel exercises involve squeezing your pelvic floor muscles, which are the same muscles you squeeze when you try to stop the flow of urine or keep from passing gas. The exercises can be done while sitting, standing, or lying down, but it is best to vary your position. Exercises How to do Kegel exercises: 1. Squeeze your pelvic floor muscles tight. You should feel a tight lift in your rectal area. If you are a female, you should also feel a tightness in your vaginal area. Keep your stomach, buttocks, and legs relaxed. 2. Hold the muscles tight for up to 10 seconds. 3. Breathe normally. 4. Relax your muscles. 5. Repeat as told by your health care provider. Repeat this exercise daily as told by your health care provider. Continue to do this exercise for at least 4-6 weeks, or for as long as told by your health care provider. You may be referred to a physical therapist who can help you learn more about how to do Kegel exercises. Depending on your condition, your health care provider may recommend:  Varying how long you squeeze your muscles.  Doing several sets of exercises every day.  Doing exercises for several weeks.  Making Kegel exercises a part of your  regular exercise routine. This information is not intended to replace advice given to you by your health care provider. Make sure you discuss any questions you have with your health care provider. Document Revised: 04/24/2018 Document Reviewed: 04/24/2018 Elsevier Patient Education  [place].

## 2020-01-22 NOTE — Progress Notes (Signed)
This visit occurred during the SARS-CoV-2 public health emergency.  Safety protocols were in place, including screening questions prior to the visit, additional usage of staff PPE, and extensive cleaning of exam room while observing appropriate contact time as indicated for disinfecting solutions.  Subjective:     Patient ID: Janet Barber , female    DOB: 1941-03-18 , 79 y.o.   MRN: [id number]   No chief complaint on file.   HPI   She is taking 1/2 HCTZ every other day and will still get up 2 times a night to urinate.  She denies snoring that she is aware.    Hypertension This is a chronic problem. The current episode started more than 1 year ago. The problem is controlled. Pertinent negatives include no anxiety, chest pain, headaches or palpitations. There are no associated agents to hypertension. Risk factors for coronary artery disease include post-menopausal state. Past treatments include diuretics, beta blockers and angiotensin blockers. Improvement on treatment: she is urinating 2 times a night from her HCTZ. There are no compliance problems.  There is no history of kidney disease. There is no history of chronic renal disease.     Past Medical History:  Diagnosis Date  . Allergic rhinitis   . Anxiety   . Colon cancer ([place])   . Diverticulosis of colon   . History of hemorrhoids   . Hx of colonic polyps   . Hypercholesterolemia   . Hypertension   . Hypothyroidism   . Lumbar back pain   . Mitral valve prolapse   . Overweight(278.02)   . Venous insufficiency   . Vitamin D deficiency      Family History  Problem Relation Age of Onset  . Hypertension Maternal Grandmother      Current Outpatient Medications:  .  aspirin 81 MG chewable tablet, Chew 81 mg by mouth daily., Disp: , Rfl:  .  atorvastatin (LIPITOR) 10 MG tablet, TAKE 1 TABLET BY MOUTH EVERY DAY, Disp: 90 tablet, Rfl: 0 .  carvedilol (COREG) 6.25 MG tablet, Take 1 tablet (6.25 mg total) by mouth 2 (two)  times daily., Disp: 180 tablet, Rfl: 3 .  hydrochlorothiazide (HYDRODIURIL) 25 MG tablet, TAKE 1 TABLET BY MOUTH EVERY DAY, Disp: 90 tablet, Rfl: 0 .  hydrocortisone cream 1 %, APPLY TO AFFECTED AREA THREE TIMES A DAY IF NEEDED IF NEEDED, Disp: 28.35 g, Rfl: 0 .  levothyroxine (SYNTHROID) 25 MCG tablet, TAKE 1 TABLET(25 MCG) BY MOUTH DAILY, Disp: 90 tablet, Rfl: 0 .  NON FORMULARY, Allergy shot 1 time a month, Disp: , Rfl:  .  valsartan (DIOVAN) 320 MG tablet, TAKE 1 TABLET BY MOUTH EVERY DAY, Disp: 90 tablet, Rfl: 0 .  vitamin B-12 (CYANOCOBALAMIN) 250 MCG tablet, Take 250 mcg by mouth daily., Disp: , Rfl:    Allergies  Allergen Reactions  . Shellfish Allergy Itching  . Penicillins     REACTION: unsure of reaction---years ago     Review of Systems  Constitutional: Negative.   Respiratory: Negative.   Cardiovascular: Negative.  Negative for chest pain, palpitations and leg swelling.  Neurological: Negative for dizziness and headaches.  Psychiatric/Behavioral: Negative.      There were no vitals filed for this visit. There is no height or weight on file to calculate BMI.   Objective:  Physical Exam Vitals reviewed.  Constitutional:      General: She is not in acute distress.    Appearance: Normal appearance. She is well-developed. She is obese.  HENT:  Head: Normocephalic and atraumatic.  Eyes:     Extraocular Movements: Extraocular movements intact.     Conjunctiva/sclera: Conjunctivae normal.     Pupils: Pupils are equal, round, and reactive to light.  Cardiovascular:     Rate and Rhythm: Normal rate and regular rhythm.     Pulses: Normal pulses.     Heart sounds: Normal heart sounds. No murmur.  Pulmonary:     Effort: Pulmonary effort is normal. No respiratory distress.     Breath sounds: Normal breath sounds.  Skin:    Capillary Refill: Capillary refill takes less than 2 seconds.  Neurological:     General: No focal deficit present.     Mental Status: She is  alert and oriented to person, place, and time.     Cranial Nerves: No cranial nerve deficit.  Psychiatric:        Mood and Affect: Mood normal.        Behavior: Behavior normal.        Thought Content: Thought content normal.        Judgment: Judgment normal.         Assessment And Plan:     1. Essential hypertension  Chronic, elevated today she is only taking 1/2 tab of HCTZ every other day  She is to start taking 1/2 tab every day  I have also explained to her that this is likely not related to her nocturia.   2. Nocturia  She is not interested in pelvic PT at this time       [name], FNP    THE PATIENT IS ENCOURAGED TO PRACTICE SOCIAL DISTANCING DUE TO THE COVID-19 PANDEMIC.

## 2020-01-29 DIAGNOSIS — J301 Allergic rhinitis due to pollen: Secondary | ICD-10-CM | POA: Diagnosis not present

## 2020-01-29 DIAGNOSIS — J3089 Other allergic rhinitis: Secondary | ICD-10-CM | POA: Diagnosis not present

## 2020-03-18 ENCOUNTER — Other Ambulatory Visit: Payer: Self-pay | Admitting: Nurse Practitioner

## 2020-03-23 ENCOUNTER — Ambulatory Visit: Payer: Medicare HMO | Admitting: Nurse Practitioner

## 2020-03-28 ENCOUNTER — Other Ambulatory Visit: Payer: Self-pay | Admitting: Nurse Practitioner

## 2020-03-28 DIAGNOSIS — E7800 Pure hypercholesterolemia, unspecified: Secondary | ICD-10-CM

## 2020-03-30 ENCOUNTER — Ambulatory Visit (INDEPENDENT_AMBULATORY_CARE_PROVIDER_SITE_OTHER): Payer: Medicare HMO | Admitting: Nurse Practitioner

## 2020-03-30 ENCOUNTER — Encounter: Payer: Self-pay | Admitting: Nurse Practitioner

## 2020-03-30 ENCOUNTER — Other Ambulatory Visit: Payer: Self-pay

## 2020-03-30 VITALS — BP 138/86 | HR 79 | Temp 97.6°F | Ht 63.6 in | Wt 176.0 lb

## 2020-03-30 DIAGNOSIS — E782 Mixed hyperlipidemia: Secondary | ICD-10-CM | POA: Diagnosis not present

## 2020-03-30 DIAGNOSIS — E039 Hypothyroidism, unspecified: Secondary | ICD-10-CM

## 2020-03-30 DIAGNOSIS — I10 Essential (primary) hypertension: Secondary | ICD-10-CM

## 2020-03-30 DIAGNOSIS — Z1159 Encounter for screening for other viral diseases: Secondary | ICD-10-CM

## 2020-03-30 MED ORDER — HYDROCHLOROTHIAZIDE 25 MG PO TABS: ORAL_TABLET | ORAL | 1 refills | Status: DC

## 2020-03-30 NOTE — Progress Notes (Signed)
This visit occurred during the SARS-CoV-2 public health emergency.  Safety protocols were in place, including screening questions prior to the visit, additional usage of staff PPE, and extensive cleaning of exam room while observing appropriate contact time as indicated for disinfecting solutions.  Subjective:     Patient ID: Janet Barber , female    DOB: 1941-06-02 , 79 y.o.   MRN: [id number]   Chief Complaint  Patient presents with  . Hypertension    HPI  Wt Readings from Last 3 Encounters: 03/30/20 : 176 lb (79.8 kg) 01/22/20 : 179 lb (81.2 kg) 01/22/20 : 179 lb 0.2 oz (81.2 kg)   Hypertension This is a chronic problem. The current episode started more than 1 year ago. The problem has been gradually improving since onset. The problem is controlled. Pertinent negatives include no anxiety, chest pain, headaches or palpitations. There are no associated agents to hypertension. Risk factors for coronary artery disease include dyslipidemia, obesity and sedentary lifestyle.     Past Medical History:  Diagnosis Date  . Allergic rhinitis   . Anxiety   . Colon cancer ([place])   . Diverticulosis of colon   . History of hemorrhoids   . Hx of colonic polyps   . Hypercholesterolemia   . Hypertension   . Hypothyroidism   . Lumbar back pain   . Mitral valve prolapse   . Overweight(278.02)   . Venous insufficiency   . Vitamin D deficiency      Family History  Problem Relation Age of Onset  . Hypertension Maternal Grandmother      Current Outpatient Medications:  .  aspirin 81 MG chewable tablet, Chew 81 mg by mouth daily., Disp: , Rfl:  .  atorvastatin (LIPITOR) 10 MG tablet, TAKE 1 TABLET BY MOUTH EVERY DAY, Disp: 90 tablet, Rfl: 1 .  carvedilol (COREG) 6.25 MG tablet, Take 1 tablet (6.25 mg total) by mouth 2 (two) times daily., Disp: 180 tablet, Rfl: 3 .  hydrochlorothiazide (HYDRODIURIL) 25 MG tablet, TAKE 1 TABLET BY MOUTH EVERY DAY, Disp: 90 tablet, Rfl: 1 .   levothyroxine (SYNTHROID) 25 MCG tablet, TAKE 1 TABLET(25 MCG) BY MOUTH DAILY, Disp: 90 tablet, Rfl: 0 .  valsartan (DIOVAN) 320 MG tablet, TAKE 1 TABLET BY MOUTH EVERY DAY, Disp: 90 tablet, Rfl: 1 .  hydrocortisone cream 1 %, APPLY TO AFFECTED AREA THREE TIMES A DAY IF NEEDED IF NEEDED (Patient not taking: Reported on 03/29/2020), Disp: 28.35 g, Rfl: 0 .  NON FORMULARY, Allergy shot 1 time a month (Patient not taking: Reported on 03/29/2020), Disp: , Rfl:  .  vitamin B-12 (CYANOCOBALAMIN) 250 MCG tablet, Take 250 mcg by mouth daily. (Patient not taking: Reported on 03/29/2020), Disp: , Rfl:    Allergies  Allergen Reactions  . Shellfish Allergy Itching  . Penicillins     REACTION: unsure of reaction---years ago     Review of Systems  Constitutional: Negative.   Respiratory: Negative.   Cardiovascular: Negative.  Negative for chest pain, palpitations and leg swelling.  Neurological: Negative for dizziness and headaches.  Psychiatric/Behavioral: Negative.      Today's Vitals   03/30/20 1045  BP: 138/86  Pulse: 79  Temp: 97.6 F (36.4 C)  TempSrc: Oral  Weight: 176 lb (79.8 kg)  Height: 5' 3.6" (1.615 m)  PainSc: 0-No pain   Body mass index is 30.59 kg/m.   Objective:  Physical Exam Vitals reviewed.  Constitutional:      General: She is not in acute distress.  Appearance: Normal appearance. She is well-developed. She is obese.  HENT:     Head: Normocephalic and atraumatic.  Eyes:     Extraocular Movements: Extraocular movements intact.     Conjunctiva/sclera: Conjunctivae normal.     Pupils: Pupils are equal, round, and reactive to light.  Cardiovascular:     Rate and Rhythm: Normal rate and regular rhythm.     Pulses: Normal pulses.     Heart sounds: Normal heart sounds. No murmur heard.   Pulmonary:     Effort: Pulmonary effort is normal. No respiratory distress.     Breath sounds: Normal breath sounds.  Skin:    Capillary Refill: Capillary refill takes less than  2 seconds.  Neurological:     General: No focal deficit present.     Mental Status: She is alert and oriented to person, place, and time.     Cranial Nerves: No cranial nerve deficit.  Psychiatric:        Mood and Affect: Mood normal.        Behavior: Behavior normal.        Thought Content: Thought content normal.        Judgment: Judgment normal.         Assessment And Plan:     1. Essential hypertension . B/P is controlled.  . CMP ordered to check renal function.  . The importance of regular exercise and dietary modification was stressed to the patient.  . Stressed importance of losing ten percent of her body weight to help with B/P control.  - hydrochlorothiazide (HYDRODIURIL) 25 MG tablet; Take 1/2 tablet by mouth daily  Dispense: 45 tablet; Refill: 1 - CMP14+EGFR  2. Encounter for hepatitis C screening test for low risk patient  Will check Hepatitis C screening due to recent recommendations to screen all adults 18 years and older - Hepatitis C antibody  3. Hypothyroidism, unspecified type  Chronic, normal at last visit visit  Continue with current medications pending lab results - T3, free - T4 - TSH  4. Mixed hyperlipidemia  Chronic, controlled  Continue with current medications, tolerating well  - Lipid panel   [name], FNP   I, [name], FNP, have reviewed all documentation for this visit. The documentation on 03/30/20 for the exam, diagnosis, procedures, and orders are all accurate and complete.  THE PATIENT IS ENCOURAGED TO PRACTICE SOCIAL DISTANCING DUE TO THE COVID-19 PANDEMIC.

## 2020-03-31 LAB — CMP14+EGFR
ALT: 16 IU/L (ref 0–32)
AST: 23 IU/L (ref 0–40)
Albumin/Globulin Ratio: 1.5 (ref 1.2–2.2)
Albumin: 4.3 g/dL (ref 3.7–4.7)
Alkaline Phosphatase: 90 IU/L (ref 48–121)
BUN/Creatinine Ratio: 14 (ref 12–28)
BUN: 14 mg/dL (ref 8–27)
Bilirubin Total: 0.6 mg/dL (ref 0.0–1.2)
CO2: 26 mmol/L (ref 20–29)
Calcium: 9.7 mg/dL (ref 8.7–10.3)
Chloride: 104 mmol/L (ref 96–106)
Creatinine, Ser: 0.99 mg/dL (ref 0.57–1.00)
GFR calc Af Amer: 63 mL/min/{1.73_m2} (ref >59)
GFR calc non Af Amer: 55 mL/min/{1.73_m2} — ABNORMAL LOW (ref >59)
Globulin, Total: 2.8 g/dL (ref 1.5–4.5)
Glucose: 85 mg/dL (ref 65–99)
Potassium: 3.7 mmol/L (ref 3.5–5.2)
Sodium: 143 mmol/L (ref 134–144)
Total Protein: 7.1 g/dL (ref 6.0–8.5)

## 2020-03-31 LAB — LIPID PANEL
Chol/HDL Ratio: 3.7 ratio (ref 0.0–4.4)
Cholesterol, Total: 198 mg/dL (ref 100–199)
HDL: 53 mg/dL (ref >39)
LDL Chol Calc (NIH): 129 mg/dL — ABNORMAL HIGH (ref 0–99)
Triglycerides: 89 mg/dL (ref 0–149)
VLDL Cholesterol Cal: 16 mg/dL (ref 5–40)

## 2020-03-31 LAB — T3, FREE: T3, Free: 2.5 pg/mL (ref 2.0–4.4)

## 2020-03-31 LAB — TSH: TSH: 3.74 u[IU]/mL (ref 0.450–4.500)

## 2020-03-31 LAB — HEPATITIS C ANTIBODY: Hep C Virus Ab: <0.1 s/co ratio (ref 0.0–0.9)

## 2020-03-31 LAB — T4: T4, Total: 7.8 ug/dL (ref 4.5–12.0)

## 2020-04-26 ENCOUNTER — Encounter: Payer: Self-pay | Admitting: Nurse Practitioner

## 2020-04-27 ENCOUNTER — Encounter: Payer: Self-pay | Admitting: Nurse Practitioner

## 2020-06-03 ENCOUNTER — Telehealth: Payer: Self-pay | Admitting: *Deleted

## 2020-06-03 NOTE — Chronic Care Management (AMB) (Signed)
  Chronic Care Management   Outreach Note  06/03/2020 Name: Janet Barber MRN: [id number] DOB: 10-17-1940  Janet Barber is a 79 y.o. year old female who is a primary care patient of [name], [place]. I reached out to Janet Barber by phone today in response to a referral sent by Ms. Lowella Grip [place]'s health plan.     An unsuccessful telephone outreach was attempted today. The patient was referred to the case management team for assistance with care management and care coordination.   Follow Up Plan: A HIPAA compliant phone message was left for the patient providing contact information and requesting a return call. The care management team will reach out to the patient again over the next 7 days. If patient returns call to provider office, please advise to call [place] at [phone].  [place] [facility]

## 2020-06-10 NOTE — Chronic Care Management (AMB) (Signed)
  Chronic Care Management   Note  06/10/2020 Name: Janet Barber MRN: [id number] DOB: [birth year]  Janet Barber is a 79 y.o. year old female who is a primary care patient of [name], [place]. I reached out to Janet Barber by phone today in response to a referral sent by Ms. Lowella Grip [place]'s health plan.     Ms. Walko was given information about Chronic Care Management services today including:  1. CCM service includes personalized support from designated clinical staff supervised by her physician, including individualized plan of care and coordination with other care providers 2. 24/7 contact phone numbers for assistance for urgent and routine care needs. 3. Service will only be billed when office clinical staff spend 20 minutes or more in a month to coordinate care. 4. Only one practitioner may furnish and bill the service in a calendar month. 5. The patient may stop CCM services at any time (effective at the end of the month) by phone call to the office staff. 6. The patient will be responsible for cost sharing (co-pay) of up to 20% of the service fee (after annual deductible is met).  Patient agreed to services and verbal consent obtained.   Follow up plan: Face to Face appointment with care management team member scheduled for: 06/29/2020  [place] [facility]

## 2020-06-22 ENCOUNTER — Telehealth: Payer: Self-pay

## 2020-06-22 NOTE — Chronic Care Management (AMB) (Signed)
    Chronic Care Management Pharmacy Assistant    Name: Janet Barber  MRN: [id number] DOB: 06-30-1941    Reason for Encounter: Medication Review - Initial Questions for Pharmacist visit on 06/29/2020.  Patient Questions:   Have you seen any other providers since your last visit? No  Any changes in your medications or health? No   Any side effects from any medications? No  Do you have an symptoms or problems not managed by your medications? No  Any concerns about your health right now? No  Has your provider asked that you check blood pressure, blood sugar, or follow special diet at home? Patient states she takes her blood pressure once a day.  Do you get any type of exercise on a regular basis? Patient states she walks for 30 minutes  every  Monday , Wednesday and Friday.  Can you think of a goal you would like to reach for your health? Patient states she wants to lower her blood pressure, Patient states she has been watching her sodium intake.  Do you have any problems getting your medications? No , [address] , [place].  Is there anything that you would like to discuss during the appointment? No. Patient states she is blessed.  Patient is aware that she needs to bring medications and supplements to appointment.     PCP : [name], FNP  Allergies:   Allergies  Allergen Reactions  . Shellfish Allergy Itching  . Penicillins     REACTION: unsure of reaction---years ago    Medications: Outpatient Encounter Medications as of 06/22/2020  Medication Sig  . aspirin 81 MG chewable tablet Chew 81 mg by mouth daily.  Marland Kitchen atorvastatin (LIPITOR) 10 MG tablet TAKE 1 TABLET BY MOUTH EVERY DAY  . carvedilol (COREG) 6.25 MG tablet Take 1 tablet (6.25 mg total) by mouth 2 (two) times daily.  . hydrochlorothiazide (HYDRODIURIL) 25 MG tablet Take 1/2 tablet by mouth daily  . hydrocortisone cream 1 % APPLY TO AFFECTED AREA THREE TIMES A DAY IF NEEDED IF NEEDED  (Patient not taking: Reported on 03/29/2020)  . levothyroxine (SYNTHROID) 25 MCG tablet TAKE 1 TABLET(25 MCG) BY MOUTH DAILY  . NON FORMULARY Allergy shot 1 time a month (Patient not taking: Reported on 03/29/2020)  . valsartan (DIOVAN) 320 MG tablet TAKE 1 TABLET BY MOUTH EVERY DAY  . vitamin B-12 (CYANOCOBALAMIN) 250 MCG tablet Take 250 mcg by mouth daily. (Patient not taking: Reported on 03/29/2020)   No facility-administered encounter medications on file as of 06/22/2020.    Current Diagnosis: Patient Active Problem List   Diagnosis Date Noted  . Mixed hyperlipidemia 12/16/2018  . Hypothyroidism 12/16/2018  . VENOUS INSUFFICIENCY 06/12/2009  . Vitamin D deficiency 06/09/2009  . DIVERTICULOSIS OF COLON 06/07/2008  . COLONIC POLYPS 06/03/2008  . HYPERCHOLESTEROLEMIA 06/03/2008  . OVERWEIGHT 06/03/2008  . ANXIETY 06/03/2008  . Essential hypertension 06/03/2008  . MITRAL VALVE PROLAPSE 06/03/2008  . ALLERGIC RHINITIS 06/03/2008      Follow-Up:  Pharmacist Review - Patient needs to reschedule appointment on 06/29/2020 will be out of town.   Jannette Fogo, CPP notified.  Judithann Sheen, [facility] Clinical Pharmacist Assistant [phone]

## 2020-06-23 ENCOUNTER — Telehealth: Payer: Self-pay | Admitting: Nurse Practitioner

## 2020-06-23 ENCOUNTER — Telehealth: Payer: Self-pay | Admitting: *Deleted

## 2020-06-23 NOTE — Chronic Care Management (AMB) (Signed)
  Care Management   Note  06/23/2020 Name: Janet Barber MRN: [id number] DOB: August 26, 1941  Janet Barber is a 79 y.o. year old female who is a primary care patient of [name], [place] and is actively engaged with the care management team. I reached out to Janet Barber by phone today to assist with re-scheduling an initial visit with the Pharmacist  Follow up plan: Unsuccessful telephone outreach attempt made. A HIPAA compliant phone message was left for the patient providing contact information and requesting a return call.  The care management team will reach out to the patient again over the next 7 days.  If patient returns call to provider office, please advise to call [place] at [place] [facility]

## 2020-06-23 NOTE — Progress Notes (Signed)
  Chronic Care Management   Outreach Note  06/23/2020 Name: Nathaly Dawkins MRN: [id number] DOB: 07-Aug-1941  Referred by: [name], FNP Reason for referral : No chief complaint on file.   An unsuccessful telephone outreach was attempted today. The patient was referred to the pharmacist for assistance with care management and care coordination.   Follow Up Plan:   Carley Perdue [employer] Scheduler

## 2020-06-24 ENCOUNTER — Telehealth: Payer: Self-pay | Admitting: Nurse Practitioner

## 2020-06-24 NOTE — Progress Notes (Signed)
  Chronic Care Management   Outreach Note  06/24/2020 Name: Juanita Streight MRN: [id number] DOB: 08-04-1941  Referred by: [name], FNP Reason for referral : No chief complaint on file.   A second unsuccessful telephone outreach was attempted today. The patient was referred to pharmacist for assistance with care management and care coordination.  Follow Up Plan:   Carley Perdue [employer] Scheduler

## 2020-06-25 ENCOUNTER — Telehealth: Payer: Self-pay | Admitting: Nurse Practitioner

## 2020-06-25 NOTE — Progress Notes (Signed)
  Chronic Care Management   Outreach Note  06/25/2020 Name: Janet Barber MRN: [id number] DOB: 02-Dec-1940  Referred by: [name], FNP Reason for referral : No chief complaint on file.   Third unsuccessful telephone outreach was attempted today. The patient was referred to the pharmacist for assistance with care management and care coordination.   Follow Up Plan:   Carley Perdue [employer] Scheduler

## 2020-06-29 ENCOUNTER — Ambulatory Visit: Payer: Medicare HMO

## 2020-06-29 ENCOUNTER — Encounter: Payer: Medicare Other | Admitting: Nurse Practitioner

## 2020-06-30 NOTE — Chronic Care Management (AMB) (Signed)
  Chronic Care Management   Note  06/30/2020 Name: Janet Barber MRN: [id number] DOB: 06-16-1941  Janet Barber is a 79 y.o. year old female who is a primary care patient of [name], [place] and is actively engaged with the care management team. I reached out to Janet Barber by phone today to assist with re-scheduling an initial visit with the Pharmacist.  Follow up plan: Telephone appointment with care management team member scheduled for:08/09/2020  [place] [facility]

## 2020-07-14 ENCOUNTER — Telehealth: Payer: Self-pay | Admitting: Pharmacist

## 2020-07-14 NOTE — Chronic Care Management (AMB) (Signed)
    Chronic Care Management Pharmacy Assistant   Name: Janet Barber  MRN: [id number] DOB: 1940/10/26  Reason for Encounter: Medication Review - Medication Adherence.   PCP : [name], FNP  Allergies:   Allergies  Allergen Reactions  . Shellfish Allergy Itching  . Penicillins     REACTION: unsure of reaction---years ago    Medications: Outpatient Encounter Medications as of 07/14/2020  Medication Sig  . aspirin 81 MG chewable tablet Chew 81 mg by mouth daily.  Marland Kitchen atorvastatin (LIPITOR) 10 MG tablet TAKE 1 TABLET BY MOUTH EVERY DAY  . carvedilol (COREG) 6.25 MG tablet Take 1 tablet (6.25 mg total) by mouth 2 (two) times daily.  . hydrochlorothiazide (HYDRODIURIL) 25 MG tablet Take 1/2 tablet by mouth daily  . hydrocortisone cream 1 % APPLY TO AFFECTED AREA THREE TIMES A DAY IF NEEDED IF NEEDED (Patient not taking: Reported on 03/29/2020)  . levothyroxine (SYNTHROID) 25 MCG tablet TAKE 1 TABLET(25 MCG) BY MOUTH DAILY  . NON FORMULARY Allergy shot 1 time a month (Patient not taking: Reported on 03/29/2020)  . valsartan (DIOVAN) 320 MG tablet TAKE 1 TABLET BY MOUTH EVERY DAY  . vitamin B-12 (CYANOCOBALAMIN) 250 MCG tablet Take 250 mcg by mouth daily. (Patient not taking: Reported on 03/29/2020)   No facility-administered encounter medications on file as of 07/14/2020.    Current Diagnosis: Patient Active Problem List   Diagnosis Date Noted  . Mixed hyperlipidemia 12/16/2018  . Hypothyroidism 12/16/2018  . VENOUS INSUFFICIENCY 06/12/2009  . Vitamin D deficiency 06/09/2009  . DIVERTICULOSIS OF COLON 06/07/2008  . COLONIC POLYPS 06/03/2008  . HYPERCHOLESTEROLEMIA 06/03/2008  . OVERWEIGHT 06/03/2008  . ANXIETY 06/03/2008  . Essential hypertension 06/03/2008  . MITRAL VALVE PROLAPSE 06/03/2008  . ALLERGIC RHINITIS 06/03/2008      Follow-Up:  Pharmacist Review - Reviewed chart and adherence measures. Per [employer] data patient's medication adherence for cholesterol  (statins) are 90-99% med compliance / medication adherence for hypertension is 100% med compliance.  Beverly [email or web address]. Notified   Judithann Sheen, [place] Pharmacist Assistant [phone]

## 2020-07-16 ENCOUNTER — Ambulatory Visit: Payer: Self-pay

## 2020-08-09 ENCOUNTER — Telehealth: Payer: Medicare HMO

## 2020-08-11 ENCOUNTER — Encounter: Payer: Medicare HMO | Admitting: Nurse Practitioner

## 2020-09-01 ENCOUNTER — Encounter: Payer: Self-pay | Admitting: Nurse Practitioner

## 2020-09-01 ENCOUNTER — Ambulatory Visit (INDEPENDENT_AMBULATORY_CARE_PROVIDER_SITE_OTHER): Payer: Medicare HMO | Admitting: Nurse Practitioner

## 2020-09-01 ENCOUNTER — Other Ambulatory Visit: Payer: Self-pay

## 2020-09-01 ENCOUNTER — Ambulatory Visit: Payer: Medicare HMO

## 2020-09-01 VITALS — BP 128/86 | HR 83 | Temp 98.5°F | Ht 63.6 in | Wt 176.8 lb

## 2020-09-01 DIAGNOSIS — E559 Vitamin D deficiency, unspecified: Secondary | ICD-10-CM | POA: Diagnosis not present

## 2020-09-01 DIAGNOSIS — I10 Essential (primary) hypertension: Secondary | ICD-10-CM

## 2020-09-01 DIAGNOSIS — E782 Mixed hyperlipidemia: Secondary | ICD-10-CM

## 2020-09-01 DIAGNOSIS — Z1231 Encounter for screening mammogram for malignant neoplasm of breast: Secondary | ICD-10-CM

## 2020-09-01 DIAGNOSIS — R2241 Localized swelling, mass and lump, right lower limb: Secondary | ICD-10-CM

## 2020-09-01 DIAGNOSIS — Z23 Encounter for immunization: Secondary | ICD-10-CM

## 2020-09-01 DIAGNOSIS — E2839 Other primary ovarian failure: Secondary | ICD-10-CM | POA: Diagnosis not present

## 2020-09-01 DIAGNOSIS — E039 Hypothyroidism, unspecified: Secondary | ICD-10-CM | POA: Diagnosis not present

## 2020-09-01 DIAGNOSIS — Z0000 Encounter for general adult medical examination without abnormal findings: Secondary | ICD-10-CM | POA: Diagnosis not present

## 2020-09-01 LAB — POCT URINALYSIS DIPSTICK
Bilirubin, UA: NEGATIVE
Blood, UA: NEGATIVE
Glucose, UA: NEGATIVE
Ketones, UA: NEGATIVE
Leukocytes, UA: NEGATIVE
Nitrite, UA: NEGATIVE
Protein, UA: NEGATIVE
Spec Grav, UA: 1.025 (ref 1.010–1.025)
Urobilinogen, UA: 0.2 E.U./dL
pH, UA: 5.5 (ref 5.0–8.0)

## 2020-09-01 LAB — POCT UA - MICROALBUMIN
Creatinine, POC: 200 mg/dL
Microalbumin Ur, POC: 150 mg/L

## 2020-09-01 MED ORDER — VALSARTAN 160 MG PO TABS
160.0000 mg | ORAL_TABLET | Freq: Every day | ORAL | 1 refills | Status: DC
Start: 2020-09-01 — End: 2021-09-21

## 2020-09-01 MED ORDER — PNEUMOCOCCAL 13-VAL CONJ VACC IM SUSP: 0.5000 mL | INTRAMUSCULAR | 0 refills | Status: AC

## 2020-09-01 NOTE — Progress Notes (Signed)
This visit occurred during the SARS-CoV-2 public health emergency.  Safety protocols were in place, including screening questions prior to the visit, additional usage of staff PPE, and extensive cleaning of exam room while observing appropriate contact time as indicated for disinfecting solutions.  Subjective:     Patient ID: Janet Barber , female    DOB: [birth year] , 79 y.o.   MRN: [id number]   Chief Complaint  Patient presents with   Annual Exam    HPI  Here for hm. Patient stated she has been cutting her blood pressure pills in half (valsartan). She reports her blood pressure was 120/80.  Denies having anymore headaches  Wt Readings from Last 3 Encounters: 09/01/20 : 176 lb 12.8 oz (80.2 kg) 03/30/20 : 176 lb (79.8 kg) 01/22/20 : 179 lb (81.2 kg) Hypertension This is a chronic problem. The current episode started more than 1 year ago. The problem has been gradually improving since onset. The problem is controlled. Pertinent negatives include no anxiety, chest pain, headaches or palpitations. There are no associated agents to hypertension. Risk factors for coronary artery disease include dyslipidemia, obesity and sedentary lifestyle.     Past Medical History:  Diagnosis Date   Allergic rhinitis    Anxiety    Colon cancer (HCC)    Diverticulosis of colon    History of hemorrhoids    Hx of colonic polyps    Hypercholesterolemia    Hypertension    Hypothyroidism    Lumbar back pain    Mitral valve prolapse    Overweight(278.02)    Venous insufficiency    Vitamin D deficiency      Family History  Problem Relation Age of Onset   Hypertension Maternal Grandmother      Current Outpatient Medications:    atorvastatin (LIPITOR) 10 MG tablet, TAKE 1 TABLET BY MOUTH EVERY DAY, Disp: 90 tablet, Rfl: 1   fluticasone (FLONASE) 50 MCG/ACT nasal spray, Place 2 sprays into both nostrils daily., Disp: , Rfl:    hydrochlorothiazide (HYDRODIURIL) 25 MG tablet,  Take 1/2 tablet by mouth daily, Disp: 45 tablet, Rfl: 1   carvedilol (COREG) 6.25 MG tablet, Take 1 tablet (6.25 mg total) by mouth 2 (two) times daily. (Patient not taking: Reported on 09/01/2020), Disp: 180 tablet, Rfl: 3   levothyroxine (SYNTHROID) 25 MCG tablet, TAKE 1 TABLET(25 MCG) BY MOUTH DAILY (Patient not taking: Reported on 09/01/2020), Disp: 90 tablet, Rfl: 0   pneumococcal 13-valent conjugate vaccine (PREVNAR 13) SUSP injection, Inject 0.5 mLs into the muscle tomorrow at 10 am for 1 dose., Disp: 0.5 mL, Rfl: 0   valsartan (DIOVAN) 160 MG tablet, Take 1 tablet (160 mg total) by mouth daily., Disp: 90 tablet, Rfl: 1   Allergies  Allergen Reactions   Shellfish Allergy Itching   Penicillins     REACTION: unsure of reaction---years ago      The patient states she uses status post hysterectomy for birth control.  No LMP recorded. Patient has had a hysterectomy.. Negative for Dysmenorrhea and Negative for Menorrhagia. Negative for: breast discharge, breast lump(s), breast pain and breast self exam. Associated symptoms include abnormal vaginal bleeding. Pertinent negatives include abnormal bleeding (hematology), anxiety, decreased libido, depression, difficulty falling sleep, dyspareunia, history of infertility, nocturia, sexual dysfunction, sleep disturbances, urinary incontinence, urinary urgency, vaginal discharge and vaginal itching. Diet regular.The patient states her exercise level is moderate with walking MWF at the mall at [facility].    The patient's tobacco use is:  Social History  Tobacco Use  Smoking Status Former Smoker   Types: Cigarettes   Quit date: 09/18/1986   Years since quitting: 33.9  Smokeless Tobacco Never Used   She has been exposed to passive smoke. The patient's alcohol use is:  Social History   Substance and Sexual Activity  Alcohol Use No      Review of Systems  Constitutional: Negative.   HENT: Negative.   Eyes: Negative.    Respiratory: Negative.   Cardiovascular: Negative.  Negative for chest pain, palpitations and leg swelling.  Gastrointestinal: Negative.   Endocrine: Negative.   Genitourinary: Negative.   Musculoskeletal: Negative.   Skin: Negative.   Allergic/Immunologic: Negative.   Neurological: Negative.  Negative for headaches.  Hematological: Negative.   Psychiatric/Behavioral: Negative.      Today's Vitals   09/01/20 1505  BP: 128/86  Pulse: 83  Temp: 98.5 F (36.9 C)  TempSrc: Oral  Weight: 176 lb 12.8 oz (80.2 kg)  Height: 5' 3.6" (1.615 m)  PainSc: 0-No pain   Body mass index is 30.73 kg/m.   Objective:  Physical Exam Vitals reviewed.  Constitutional:      General: She is not in acute distress.    Appearance: Normal appearance. She is well-developed. She is obese.  HENT:     Head: Normocephalic and atraumatic.     Right Ear: Hearing, tympanic membrane, ear canal and external ear normal. There is no impacted cerumen.     Left Ear: Hearing, tympanic membrane, ear canal and external ear normal. There is no impacted cerumen.     Nose:     Comments: Deferred - masked    Mouth/Throat:     Comments: Deferred - masked Eyes:     General: Lids are normal.     Extraocular Movements: Extraocular movements intact.     Conjunctiva/sclera: Conjunctivae normal.     Pupils: Pupils are equal, round, and reactive to light.     Funduscopic exam:    Right eye: No papilledema.        Left eye: No papilledema.  Neck:     Thyroid: No thyroid mass.     Vascular: No carotid bruit.  Cardiovascular:     Rate and Rhythm: Normal rate and regular rhythm.     Pulses: Normal pulses.     Heart sounds: Normal heart sounds. No murmur heard.   Pulmonary:     Effort: Pulmonary effort is normal. No respiratory distress.     Breath sounds: Normal breath sounds. No wheezing.  Chest:     Chest wall: No mass.  Breasts:     Tanner Score is 5.     Right: Normal. No mass, tenderness, axillary  adenopathy or supraclavicular adenopathy.     Left: Normal. No mass, tenderness, axillary adenopathy or supraclavicular adenopathy.    Abdominal:     General: Abdomen is flat. Bowel sounds are normal. There is no distension.     Palpations: Abdomen is soft.     Tenderness: There is no abdominal tenderness.  Musculoskeletal:        General: No swelling or tenderness. Normal range of motion.     Cervical back: Full passive range of motion without pain, normal range of motion and neck supple.     Right lower leg: No edema.     Left lower leg: No edema.  Lymphadenopathy:     Upper Body:     Right upper body: No supraclavicular, axillary or pectoral adenopathy.     Left upper body:  No supraclavicular, axillary or pectoral adenopathy.  Skin:    General: Skin is warm and dry.     Capillary Refill: Capillary refill takes less than 2 seconds.     Comments: She has a semi firm lump to right inner thigh  Neurological:     General: No focal deficit present.     Mental Status: She is alert and oriented to person, place, and time.     Cranial Nerves: No cranial nerve deficit.     Sensory: No sensory deficit.  Psychiatric:        Mood and Affect: Mood normal.        Behavior: Behavior normal.        Thought Content: Thought content normal.        Judgment: Judgment normal.         Assessment And Plan:     1. Mixed hyperlipidemia  Chronic, stable  Continue with current medications, tolerating well - CMP14+EGFR - Lipid panel  2. Essential hypertension  B/P is controlled.   CMP ordered to check renal function.   The importance of regular exercise and dietary modification was stressed to the patient.   Stressed importance of losing ten percent of her body weight to help with B/P control.   The weight loss would help with decreasing cardiac and cancer risk as well.   EKG done with NSR - CMP14+EGFR - valsartan (DIOVAN) 160 MG tablet; Take 1 tablet (160 mg total) by mouth daily.   Dispense: 90 tablet; Refill: 1 - POCT Urinalysis Dipstick (81002) - POCT UA - Microalbumin  3. Hypothyroidism, unspecified type Chronic, stable Will check thyroid levels   4. Vitamin D deficiency  Will check vitamin D level and supplement as needed.     Also encouraged to spend 15 minutes in the sun daily.  - Hepatitis C antibody - VITAMIN D 25 Hydroxy (Vit-D Deficiency, Fractures)  5. Decreased estrogen level  Will order bone density again as she did not go this year - DG Bone Density; Future  6. Lump of right thigh  She has a semi firm area to her right inner thigh near her groin area - US SOFT TISSUE RT UPPER EXTREMITY LTD (NON-VASCULAR); Future  7. Encounter for health maintenance examination  Behavior modifications discussed and diet history reviewed.    Pt will continue to exercise regularly and modify diet with low GI, plant based foods and decrease intake of processed foods.   Recommend intake of daily multivitamin, Vitamin D, and calcium.   Recommend mammogram for preventive screenings, as well as recommend immunizations that include influenza (done today), TDAP (up to date) - CBC  8. Encounter for immunization - pneumococcal 13-valent conjugate vaccine (PREVNAR 13) SUSP injection; Inject 0.5 mLs into the muscle tomorrow at 10 am for 1 dose.  Dispense: 0.5 mL; Refill: 0  9. Encounter for screening mammogram for malignant neoplasm of breast  Pt instructed on Self Breast Exam.According to ACOG guidelines Women aged 15 and older are recommended to get an annual mammogram. Referral placed  Pt encouraged to get annual mammogram - MM Digital Screening; Future  10. Need for influenza vaccination  Influenza vaccine administered  Encouraged to take Tylenol as needed for fever or muscle aches.  - Flu Vaccine QUAD High Dose(Fluad)     Patient was given opportunity to ask questions. Patient verbalized understanding of the plan and was able to repeat key elements  of the plan. All questions were answered to their satisfaction.  Teola Bradley, FNP, have reviewed all documentation for this visit. The documentation on 09/01/20 for the exam, diagnosis, procedures, and orders are all accurate and complete.  THE PATIENT IS ENCOURAGED TO PRACTICE SOCIAL DISTANCING DUE TO THE COVID-19 PANDEMIC.

## 2020-09-01 NOTE — Chronic Care Management (AMB) (Signed)
Chronic Care Management Pharmacy  Name: Janet Barber  MRN: [id number] DOB: 12-Dec-1940   Chief Complaint/ HPI  Janet Barber,  79 y.o. , female presents for their Initial CCM visit with the clinical pharmacist In office. Patient raised by grandparents and went to [facility] for 2 years until having to leave due to pregnancy. She has two daughters who are 48 and 90 years old. She reports that she will never dye her hair in memory of her mother who died at the age of 43. She reports being with the [facility] practice for years and is grateful to have the providers here. She is a widow and her husband passed in 1999 and no one told her to file for veteran benefits. She is now filing for veteran benefits and waiting for follow up from [place].  She now lives with oldest daughter in [place] at her house. Her daughter works second shift at [employer] in [place]. She reports that she is not allowed to cook at her daughters house because she turned on the stove wrong one time. So her daughter prepares her meals for the week. She recently purchased a crock pot to use but her daughter insisted that she could not cook. She is paying her daughter $41 a month and is hopeful that she will be able to move out in February. She misses having her own home and cooking which she states is a passion of hers. Currently she tries to stay busy by going to [employer] on Thursdays at the [facility] on [address]   PCP : [name], FNP  Their chronic conditions include: Hypertension, Hyperlipidemia and  Hypothyroidism  Office Visits:  03/30/2020 OV: HTN  New medication hydrochlorhydrochlorothiazide (HYDRODIURIL) 25 MG tablet; Take 1/2 tablet by mouth daily    02/01/2020- AWV for Medicare  Consult Visit: 01/29/2020 Allergist- Immunotherapy injection for allergies   Medications: Outpatient Encounter Medications as of 09/01/2020  Medication Sig  . aspirin 81 MG chewable tablet Chew  81 mg by mouth daily.  Marland Kitchen atorvastatin (LIPITOR) 10 MG tablet TAKE 1 TABLET BY MOUTH EVERY DAY  . hydrochlorothiazide (HYDRODIURIL) 25 MG tablet Take 1/2 tablet by mouth daily  . levothyroxine (SYNTHROID) 25 MCG tablet TAKE 1 TABLET(25 MCG) BY MOUTH DAILY  . valsartan (DIOVAN) 320 MG tablet TAKE 1 TABLET BY MOUTH EVERY DAY  . carvedilol (COREG) 6.25 MG tablet Take 1 tablet (6.25 mg total) by mouth 2 (two) times daily.  . hydrocortisone cream 1 % APPLY TO AFFECTED AREA THREE TIMES A DAY IF NEEDED IF NEEDED (Patient not taking: Reported on 03/29/2020)  . NON FORMULARY Allergy shot 1 time a month (Patient not taking: Reported on 03/29/2020)  . vitamin B-12 (CYANOCOBALAMIN) 250 MCG tablet Take 250 mcg by mouth daily. (Patient not taking: Reported on 03/29/2020)   No facility-administered encounter medications on file as of 09/01/2020.     Current Diagnosis/Assessment:  Goals Addressed            This Visit's Progress   . Patient [place] (see longitudinal plan of care for additional care plan information)  Current Barriers:  . Chronic Disease Management support, education, and care coordination needs related to Hypertension, Hyperlipidemia, and Hypothyroidism   Hypertension BP Readings from Last 3 Encounters:  09/01/20 128/86  03/30/20 138/86  01/22/20 (!) 150/90   . Pharmacist Clinical Goal(s): o Over the next 90 days, patient will work with  PharmD and providers to maintain BP goal <130/80 . Current regimen:  . Valsartan 320 MG- Taking 1/2 tablet daily . Hydrochlorothiazide 25 MG- Take 1/2 daily , patient reports taking daily . Carvedilol 6.25 MG- take daily  . Interventions: o Dietary and exercise recommendations provided - Limit salt intake - Increase physical activity to 30 minutes 3 times weekly, with a long term goal of 30 minutes 5 times weekly . Patient self care activities - Over the next 90 days, patient will: o Check BP 1 time per week, document,  and provide at future appointments o Ensure daily salt intake < 2300 mg/day  Hyperlipidemia Lab Results  Component Value Date/Time   LDLCALC 93 09/01/2020 04:16 PM   . Pharmacist Clinical Goal(s): o Over the next 90 days, patient will work with PharmD and providers to maintain LDL goal < 100 . Current regimen:  o Atorvastatin 10 MG- take daily . Interventions: o Dietary and exercise recommendations provided - Increase intake of healthy fats (i.e avocados, walnuts, flaxseed) - Decrease intake of foods high in saturated and trans fat (i.e. chips) . Patient self care activities - Over the next 90 days, patient will: o Continue her exercise regimen  o Continue medication regimen o Limit fried foods  Hypothyroidism . Pharmacist Clinical Goal(s) o Over the next 90 days, patient will work with PharmD and providers to maintain thyroid levels  in normal limits . Current regimen:  o Levothyroxine 25 MCG - take daily . Interventions: o Take medication in the morning at least 30 minutes prior to first meal of the day and on an empty stomach.  . Patient self care activities - Over the next 90 days, patient will: o Take her medication everyday   Medication management . Pharmacist Clinical Goal(s): o Over the next 90 days, patient will work with PharmD and providers to maintain optimal medication adherence . Current pharmacy: [facility]  . Interventions o Comprehensive medication review performed. o Continue current medication management strategy . Patient self care activities - Over the next 90 days, patient will: o Focus on medication adherence by taking medication everyday o Take medications as prescribed o Report any questions or concerns to PharmD and/or provider(s)  Initial goal documentation       Hypertension   BP goal is:  <130/80  Office blood pressures are  BP Readings from Last 3 Encounters:  09/01/20 128/86  03/30/20 138/86  01/22/20 (!) 150/90  Pulse :  79  Patient checks BP at home rarely Patient home BP readings are ranging: she reports they are usually with in normal range at home although she does not know the range  Patients BP today at this office visit was 120/80  Patient has failed these meds in the past: Metoprolol Succinate 50 MG, Triamterene-Hydrochlorothiazide 75-50 MG Patient is currently controlled on the following medications:  . Valsartan 320 MG- Taking 1/2 tablet daily . Hydrochlorothiazide 25 MG- Take 1/2 daily , patient reports taking daily . Carvedilol 6.25 MG- take daily   We discussed the following:   Patient diet in detail:  Limits salt use  Breakfast:  Cereal with raisins (post brand)  2 pieces of toast with squash, zucchini, tomato, mayo, onions and 2 slices of bacon.   Lunch:   Pinto beans or cabbage  Exercise in detail:  She walks three days a week at least for 30 minutes, she also walks around the house frequently up and down the stairs   Plan  Continue current medications  Collaborate  with PCP team to change patient to Valsartan 160 MG tablet and determine whether patient should continue taking Hydrocholorothiazide 25 mg daily.   Hyperlipidemia   LDL goal < 100  Last lipids Lab Results  Component Value Date   CHOL 163 09/01/2020   HDL 55 09/01/2020   LDLCALC 93 09/01/2020   TRIG 82 09/01/2020   CHOLHDL 3.0 09/01/2020   Hepatic Function Latest Ref Rng & Units 09/01/2020 03/30/2020 12/23/2019  Total Protein 6.0 - 8.5 g/dL 7.1 7.1 6.6  Albumin 3.7 - 4.7 g/dL 4.2 4.3 4.0  AST 0 - 40 IU/L '22 23 22  ' ALT 0 - 32 IU/L '15 16 13  ' Alk Phosphatase 44 - 121 IU/L 79 90 83  Total Bilirubin 0.0 - 1.2 mg/dL 0.5 0.6 0.7  Bilirubin, Direct 0.0 - 0.3 mg/dL - - -     The 10-year ASCVD risk score Mikey Bussing DC Jr., et al., 2013) is: 14.7%   Values used to calculate the score:     Age: 60 years     Sex: Female     Is Non-Hispanic African American: Yes     Diabetic: No     Tobacco smoker: No      Systolic Blood Pressure: 353 mmHg     Is BP treated: Yes     HDL Cholesterol: 55 mg/dL     Total Cholesterol: 163 mg/dL   Patient has failed these meds in past: Crestor 20 MG Patient is currently controlled on the following medications:  . Atorvastatin 10 MG- take daily . Aspirin 81 MG- take daily   We discussed:    She does not miss her medication and takes it everyday  Congratulated her on her HDL being greater then 50  Diet and Exercise extensively  Patient rarely eats fried foods  Plan  Continue current medications Collaborate with PCP team to discontinue ASA 81 mg tablet due to patients age >65 years old   Hypothyroidism   Lab Results  Component Value Date/Time   TSH 3.740 03/30/2020 12:12 PM   TSH 3.450 12/23/2019 10:36 AM   FREET4 1.17 03/19/2019 10:26 AM    Patient has failed these meds in past: None reported Patient is currently controlled on the following medications:  . Levothyroxine 25 MCG- take daily in the morning  We discussed:   Patient takes medication first thing in the morning at least 30 to 60 minutes prior to eating breakfast on an empty stomach  Plan  Continue current medications   Allergic Rhinitis  Followed by Dr. Harold Hedge at [place]  Patient has failed these meds in past: none noted Patient is currently controlled on the following medications:  . Allergy Shot- 1 time per month - Last taken 03/29/2020 . Fluticasone Furoate - 2 inhalations into each nostril daily  We discussed:    Patient reports that her allergies are doing well, she sees her allergist and gets shots.   Plan  Continue current medications   Vaccines   Reviewed and discussed patient's vaccination history.    Immunization History  Administered Date(s) Administered  . Fluad Quad(high Dose 65+) 09/01/2020  . Influenza Whole 06/03/2008, 07/08/2009  . Influenza-Unspecified 07/18/2011, 06/18/2018, 06/16/2019  . Moderna Sars-Covid-2 Vaccination  10/14/2019, 11/11/2019  . PFIZER SARS-COV-2 Vaccination 08/02/2020  . Pneumococcal Polysaccharide-23 06/09/2009  . Pneumococcal-Unspecified 06/21/2019  . Tdap 05/06/2013  . Zoster Recombinat (Shingrix) 06/21/2019, 08/29/2019   We discussed: -The importance of the Flu Shot every year   Plan  Recommended patient  receive High Dose Flu Shot vaccine in PCP office.   Medication Management   Patient's preferred pharmacy is:  [facility] [phone] - [place], [place] - [phone] AT [place] [address] Phone: [phone] Fax: [place], [place] [address] Phone: [phone] Fax: [phone]  Uses pill box? No - she prefers her medicatioin to be in a pill bottle.  Pt endorses 100% compliance  We discussed: Discussed benefits of medication synchronization, packaging and delivery as well as enhanced pharmacist oversight with [employer].  Plan  Continue current medication management strategy    Follow up: 3 month phone visit  [name], PharmD Clinical Pharmacist [facility] [phone]

## 2020-09-01 NOTE — Patient Instructions (Signed)
Health Maintenance, Female Adopting a healthy lifestyle and getting preventive care are important in promoting health and wellness. Ask your health care provider about:  The right schedule for you to have regular tests and exams.  Things you can do on your own to prevent diseases and keep yourself healthy. What should I know about diet, weight, and exercise? Eat a healthy diet   Eat a diet that includes plenty of vegetables, fruits, low-fat dairy products, and lean protein.  Do not eat a lot of foods that are high in solid fats, added sugars, or sodium. Maintain a healthy weight Body mass index (BMI) is used to identify weight problems. It estimates body fat based on height and weight. Your health care provider can help determine your BMI and help you achieve or maintain a healthy weight. Get regular exercise Get regular exercise. This is one of the most important things you can do for your health. Most adults should:  Exercise for at least 150 minutes each week. The exercise should increase your heart rate and make you sweat (moderate-intensity exercise).  Do strengthening exercises at least twice a week. This is in addition to the moderate-intensity exercise.  Spend less time sitting. Even light physical activity can be beneficial. Watch cholesterol and blood lipids Have your blood tested for lipids and cholesterol at 78 years of age, then have this test every 5 years. Have your cholesterol levels checked more often if:  Your lipid or cholesterol levels are high.  You are older than 78 years of age.  You are at high risk for heart disease. What should I know about cancer screening? Depending on your health history and family history, you may need to have cancer screening at various ages. This may include screening for:  Breast cancer.  Cervical cancer.  Colorectal cancer.  Skin cancer.  Lung cancer. What should I know about heart disease, diabetes, and high blood  pressure? Blood pressure and heart disease  High blood pressure causes heart disease and increases the risk of stroke. This is more likely to develop in people who have high blood pressure readings, are of African descent, or are overweight.  Have your blood pressure checked: ? Every 3-5 years if you are 18-39 years of age. ? Every year if you are 40 years old or older. Diabetes Have regular diabetes screenings. This checks your fasting blood sugar level. Have the screening done:  Once every three years after age 40 if you are at a normal weight and have a low risk for diabetes.  More often and at a younger age if you are overweight or have a high risk for diabetes. What should I know about preventing infection? Hepatitis B If you have a higher risk for hepatitis B, you should be screened for this virus. Talk with your health care provider to find out if you are at risk for hepatitis B infection. Hepatitis C Testing is recommended for:  Everyone born from 1945 through 1965.  Anyone with known risk factors for hepatitis C. Sexually transmitted infections (STIs)  Get screened for STIs, including gonorrhea and chlamydia, if: ? You are sexually active and are younger than 78 years of age. ? You are older than 78 years of age and your health care provider tells you that you are at risk for this type of infection. ? Your sexual activity has changed since you were last screened, and you are at increased risk for chlamydia or gonorrhea. Ask your health care provider if   you are at risk.  Ask your health care provider about whether you are at high risk for HIV. Your health care provider may recommend a prescription medicine to help prevent HIV infection. If you choose to take medicine to prevent HIV, you should first get tested for HIV. You should then be tested every 3 months for as long as you are taking the medicine. Pregnancy  If you are about to stop having your period (premenopausal) and  you may become pregnant, seek counseling before you get pregnant.  Take 400 to 800 micrograms (mcg) of folic acid every day if you become pregnant.  Ask for birth control (contraception) if you want to prevent pregnancy. Osteoporosis and menopause Osteoporosis is a disease in which the bones lose minerals and strength with aging. This can result in bone fractures. If you are 65 years old or older, or if you are at risk for osteoporosis and fractures, ask your health care provider if you should:  Be screened for bone loss.  Take a calcium or vitamin D supplement to lower your risk of fractures.  Be given hormone replacement therapy (HRT) to treat symptoms of menopause. Follow these instructions at home: Lifestyle  Do not use any products that contain nicotine or tobacco, such as cigarettes, e-cigarettes, and chewing tobacco. If you need help quitting, ask your health care provider.  Do not use street drugs.  Do not share needles.  Ask your health care provider for help if you need support or information about quitting drugs. Alcohol use  Do not drink alcohol if: ? Your health care provider tells you not to drink. ? You are pregnant, may be pregnant, or are planning to become pregnant.  If you drink alcohol: ? Limit how much you use to 0-1 drink a day. ? Limit intake if you are breastfeeding.  Be aware of how much alcohol is in your drink. In the [place] one drink equals one 12 oz bottle of beer (355 mL), one 5 oz glass of wine (148 mL), or one 1 oz glass of hard liquor (44 mL). General instructions  Schedule regular health, dental, and eye exams.  Stay current with your vaccines.  Tell your health care provider if: ? You often feel depressed. ? You have ever been abused or do not feel safe at home. Summary  Adopting a healthy lifestyle and getting preventive care are important in promoting health and wellness.  Follow your health care provider's instructions about healthy  diet, exercising, and getting tested or screened for diseases.  Follow your health care provider's instructions on monitoring your cholesterol and blood pressure. This information is not intended to replace advice given to you by your health care provider. Make sure you discuss any questions you have with your health care provider. Document Revised: 08/28/2018 Document Reviewed: 08/28/2018 Elsevier Patient Education  2020 Elsevier Inc.  

## 2020-09-02 LAB — CMP14+EGFR
ALT: 15 IU/L (ref 0–32)
AST: 22 IU/L (ref 0–40)
Albumin/Globulin Ratio: 1.4 (ref 1.2–2.2)
Albumin: 4.2 g/dL (ref 3.7–4.7)
Alkaline Phosphatase: 79 IU/L (ref 44–121)
BUN/Creatinine Ratio: 14 (ref 12–28)
BUN: 13 mg/dL (ref 8–27)
Bilirubin Total: 0.5 mg/dL (ref 0.0–1.2)
CO2: 25 mmol/L (ref 20–29)
Calcium: 9.7 mg/dL (ref 8.7–10.3)
Chloride: 104 mmol/L (ref 96–106)
Creatinine, Ser: 0.93 mg/dL (ref 0.57–1.00)
GFR calc Af Amer: 68 mL/min/{1.73_m2} (ref >59)
GFR calc non Af Amer: 59 mL/min/{1.73_m2} — ABNORMAL LOW (ref >59)
Globulin, Total: 2.9 g/dL (ref 1.5–4.5)
Glucose: 81 mg/dL (ref 65–99)
Potassium: 4.2 mmol/L (ref 3.5–5.2)
Sodium: 141 mmol/L (ref 134–144)
Total Protein: 7.1 g/dL (ref 6.0–8.5)

## 2020-09-02 LAB — LIPID PANEL
Chol/HDL Ratio: 3 ratio (ref 0.0–4.4)
Cholesterol, Total: 163 mg/dL (ref 100–199)
HDL: 55 mg/dL (ref >39)
LDL Chol Calc (NIH): 93 mg/dL (ref 0–99)
Triglycerides: 82 mg/dL (ref 0–149)
VLDL Cholesterol Cal: 15 mg/dL (ref 5–40)

## 2020-09-02 LAB — CBC
Hematocrit: 39.9 % (ref 34.0–46.6)
Hemoglobin: 13.2 g/dL (ref 11.1–15.9)
MCH: 29.5 pg (ref 26.6–33.0)
MCHC: 33.1 g/dL (ref 31.5–35.7)
MCV: 89 fL (ref 79–97)
Platelets: 235 10*3/uL (ref 150–450)
RBC: 4.48 x10E6/uL (ref 3.77–5.28)
RDW: 11.7 % (ref 11.7–15.4)
WBC: 5.8 10*3/uL (ref 3.4–10.8)

## 2020-09-02 LAB — VITAMIN D 25 HYDROXY (VIT D DEFICIENCY, FRACTURES): Vit D, 25-Hydroxy: 63.4 ng/mL (ref 30.0–100.0)

## 2020-09-02 LAB — HEPATITIS C ANTIBODY: Hep C Virus Ab: 0.3 s/co ratio (ref 0.0–0.9)

## 2020-09-02 NOTE — Addendum Note (Signed)
Addended by: Luana Shu on: 09/02/2020 11:30 AM   Modules accepted: Orders

## 2020-09-02 NOTE — Addendum Note (Signed)
Addended by: [name] F on: 09/02/2020 02:59 PM   Modules accepted: Orders

## 2020-09-07 ENCOUNTER — Telehealth: Payer: Self-pay

## 2020-09-07 NOTE — Chronic Care Management (AMB) (Signed)
    Chronic Care Management Pharmacy Assistant   Name: Janet Barber  MRN: [id number] DOB: Aug 18, 1941    Reason for Encounter: Medication Management     PCP : [name], FNP  Allergies:   Allergies  Allergen Reactions  . Shellfish Allergy Itching  . Penicillins     REACTION: unsure of reaction---years ago    Medications: Outpatient Encounter Medications as of 09/07/2020  Medication Sig  . atorvastatin (LIPITOR) 10 MG tablet TAKE 1 TABLET BY MOUTH EVERY DAY  . carvedilol (COREG) 6.25 MG tablet Take 1 tablet (6.25 mg total) by mouth 2 (two) times daily. (Patient not taking: Reported on 09/01/2020)  . fluticasone (FLONASE) 50 MCG/ACT nasal spray Place 2 sprays into both nostrils daily.  . hydrochlorothiazide (HYDRODIURIL) 25 MG tablet Take 1/2 tablet by mouth daily  . levothyroxine (SYNTHROID) 25 MCG tablet TAKE 1 TABLET(25 MCG) BY MOUTH DAILY (Patient not taking: Reported on 09/01/2020)  . valsartan (DIOVAN) 160 MG tablet Take 1 tablet (160 mg total) by mouth daily.   No facility-administered encounter medications on file as of 09/07/2020.    Current Diagnosis: Patient Active Problem List   Diagnosis Date Noted  . Mixed hyperlipidemia 12/16/2018  . Hypothyroidism 12/16/2018  . VENOUS INSUFFICIENCY 06/12/2009  . Vitamin D deficiency 06/09/2009  . DIVERTICULOSIS OF COLON 06/07/2008  . COLONIC POLYPS 06/03/2008  . HYPERCHOLESTEROLEMIA 06/03/2008  . OVERWEIGHT 06/03/2008  . ANXIETY 06/03/2008  . Essential hypertension 06/03/2008  . MITRAL VALVE PROLAPSE 06/03/2008  . ALLERGIC RHINITIS 06/03/2008     Follow-Up:  Pharmacist Review - Reviewed Chart and Adherence measures . Per [employer] data Medications adherence for cholesterol 90-99 % med compliance / Medication adherence for hypertension 100% med compliance  [name], CPP. Notified  Judithann Sheen, [place] Pharmacist Assistant [phone]

## 2020-09-14 ENCOUNTER — Other Ambulatory Visit: Payer: Self-pay | Admitting: Nurse Practitioner

## 2020-09-14 DIAGNOSIS — I10 Essential (primary) hypertension: Secondary | ICD-10-CM

## 2020-09-14 DIAGNOSIS — E7800 Pure hypercholesterolemia, unspecified: Secondary | ICD-10-CM

## 2020-09-15 NOTE — Patient Instructions (Signed)
Visit Information  Goals Addressed            This Visit's Progress   . Patient Stated       CARE PLAN ENTRY (see longitudinal plan of care for additional care plan information)  Current Barriers:  . Chronic Disease Management support, education, and care coordination needs related to Hypertension, Hyperlipidemia, and Hypothyroidism   Hypertension BP Readings from Last 3 Encounters:  09/01/20 128/86  03/30/20 138/86  01/22/20 (!) 150/90   . Pharmacist Clinical Goal(s): o Over the next 90 days, patient will work with PharmD and providers to maintain BP goal <130/80 . Current regimen:  . Valsartan 320 MG- Taking 1/2 tablet daily . Hydrochlorothiazide 25 MG- Take 1/2 daily , patient reports taking daily . Carvedilol 6.25 MG- take daily  . Interventions: o Dietary and exercise recommendations provided - Limit salt intake - Increase physical activity to 30 minutes 3 times weekly, with a long term goal of 30 minutes 5 times weekly . Patient self care activities - Over the next 90 days, patient will: o Check BP 1 time per week, document, and provide at future appointments o Ensure daily salt intake < 2300 mg/day  Hyperlipidemia Lab Results  Component Value Date/Time   LDLCALC 93 09/01/2020 04:16 PM   . Pharmacist Clinical Goal(s): o Over the next 90 days, patient will work with PharmD and providers to maintain LDL goal < 100 . Current regimen:  o Atorvastatin 10 MG- take daily . Interventions: o Dietary and exercise recommendations provided - Increase intake of healthy fats (i.e avocados, walnuts, flaxseed) - Decrease intake of foods high in saturated and trans fat (i.e. chips) . Patient self care activities - Over the next 90 days, patient will: o Continue her exercise regimen  o Continue medication regimen o Limit fried foods  Hypothyroidism . Pharmacist Clinical Goal(s) o Over the next 90 days, patient will work with PharmD and providers to maintain thyroid levels   in normal limits . Current regimen:  o Levothyroxine 25 MCG - take daily . Interventions: o Take medication in the morning at least 30 minutes prior to first meal of the day and on an empty stomach.  . Patient self care activities - Over the next 90 days, patient will: o Take her medication everyday   Medication management . Pharmacist Clinical Goal(s): o Over the next 90 days, patient will work with PharmD and providers to maintain optimal medication adherence . Current pharmacy: [facility]  . Interventions o Comprehensive medication review performed. o Continue current medication management strategy . Patient self care activities - Over the next 90 days, patient will: o Focus on medication adherence by taking medication everyday o Take medications as prescribed o Report any questions or concerns to PharmD and/or provider(s)  Initial goal documentation        Janet Barber was given information about Chronic Care Management services today including:  1. CCM service includes personalized support from designated clinical staff supervised by her physician, including individualized plan of care and coordination with other care providers 2. 24/7 contact phone numbers for assistance for urgent and routine care needs. 3. Standard insurance, coinsurance, copays and deductibles apply for chronic care management only during months in which we provide at least 20 minutes of these services. Most insurances cover these services at 100%, however patients may be responsible for any copay, coinsurance and/or deductible if applicable. This service may help you avoid the need for more expensive face-to-face services. 4. Only one practitioner  may furnish and bill the service in a calendar month. 5. The patient may stop CCM services at any time (effective at the end of the month) by phone call to the office staff.  Patient agreed to services and verbal consent obtained.   The patient verbalized  understanding of instructions, educational materials, and care plan provided today and declined offer to receive copy of patient instructions, educational materials, and care plan.  The pharmacy team will reach out to the patient again over the next 90 days.   [name], PharmD Clinical Pharmacist [facility] [phone]

## 2020-10-04 ENCOUNTER — Telehealth: Payer: Self-pay

## 2020-10-04 NOTE — Chronic Care Management (AMB) (Signed)
Chronic Care Management Pharmacy Assistant   Name: Janet Barber  MRN: [id number] DOB: [birth year]  Reason for Encounter: Disease State Hypertension and Lipid Adherence Call.  Patient Questions:   1.  Have you seen any other providers since your last visit? Yes. 09/01/2020 Janet Barber , FNP Need for Influenza Vaccination.   2.  Any changes in your medicines or health? No.   .  PCP : Janet Brine, FNP  Allergies:   Allergies  Allergen Reactions   Shellfish Allergy Itching   Penicillins     REACTION: unsure of reaction---years ago    Medications: Outpatient Encounter Medications as of 10/04/2020  Medication Sig   atorvastatin (LIPITOR) 10 MG tablet TAKE 1 TABLET BY MOUTH EVERY DAY   carvedilol (COREG) 6.25 MG tablet Take 1 tablet (6.25 mg total) by mouth 2 (two) times daily. (Patient not taking: Reported on 09/01/2020)   fluticasone (FLONASE) 50 MCG/ACT nasal spray Place 2 sprays into both nostrils daily.   hydrochlorothiazide (HYDRODIURIL) 25 MG tablet TAKE 1 TABLET BY MOUTH EVERY DAY   levothyroxine (SYNTHROID) 25 MCG tablet TAKE 1 TABLET(25 MCG) BY MOUTH DAILY (Patient not taking: Reported on 09/01/2020)   valsartan (DIOVAN) 160 MG tablet Take 1 tablet (160 mg total) by mouth daily.   No facility-administered encounter medications on file as of 10/04/2020.    Current Diagnosis: Patient Active Problem List   Diagnosis Date Noted   Mixed hyperlipidemia 12/16/2018   Hypothyroidism 12/16/2018   VENOUS INSUFFICIENCY 06/12/2009   Vitamin D deficiency 06/09/2009   DIVERTICULOSIS OF COLON 06/07/2008   COLONIC POLYPS 06/03/2008   HYPERCHOLESTEROLEMIA 06/03/2008   OVERWEIGHT 06/03/2008   ANXIETY 06/03/2008   Essential hypertension 06/03/2008   MITRAL VALVE PROLAPSE 06/03/2008   ALLERGIC RHINITIS 06/03/2008   Reviewed chart prior to disease state call. Spoke with patient regarding BP  Recent Office Vitals: BP Readings from Last 3 Encounters:   09/01/20 128/86  03/30/20 138/86  01/22/20 (!) 150/90   Pulse Readings from Last 3 Encounters:  09/01/20 83  03/30/20 79  01/22/20 (!) 118    Wt Readings from Last 3 Encounters:  09/01/20 176 lb 12.8 oz (80.2 kg)  03/30/20 176 lb (79.8 kg)  01/22/20 179 lb (81.2 kg)     Kidney Function Lab Results  Component Value Date/Time   CREATININE 0.93 09/01/2020 04:11 PM   CREATININE 0.99 03/30/2020 12:12 PM   GFR 73.10 06/21/2011 10:48 AM   GFRNONAA 59 (L) 09/01/2020 04:11 PM   GFRAA 68 09/01/2020 04:11 PM    BMP Latest Ref Rng & Units 09/01/2020 03/30/2020 12/23/2019  Glucose 65 - 99 mg/dL 81 85 95  BUN 8 - 27 mg/dL 13 14 17   Creatinine 0.57 - 1.00 mg/dL 0.93 0.99 0.97  BUN/Creat Ratio 12 - 28 14 14 18   Sodium 134 - 144 mmol/L 141 143 143  Potassium 3.5 - 5.2 mmol/L 4.2 3.7 3.5  Chloride 96 - 106 mmol/L 104 104 105  CO2 20 - 29 mmol/L 25 26 24   Calcium 8.7 - 10.3 mg/dL 9.7 9.7 9.6    Current antihypertensive regimen:  o Carvedilol 6.25 mg tablet one tablet twice a day o Hydrochlorothiazide 25 mg one tablet a day o Valsartan 160 mg one tablet a day   How often are you checking your Blood Pressure? Patient states she is taking blood pressure once a day   Current home BP readings: None. Patient does not keep diary .   What recent interventions/DTPs have been made  by any provider to improve Blood Pressure control since last CPP Visit: Patient states she has been taking her medication as directed.   Any recent hospitalizations or ED visits since last visit with CPP? No   What diet changes have been made to improve Blood Pressure Control?  o Patient states she has been reading labels on foods / no salt chips / healthy diet.   What exercise is being done to improve your Blood Pressure Control?  o Patient states she walks three days a week at [facility] for 30 minutes . She also walks her 16 steps four times a day , states she is very active.   Adherence Review: Is the  patient currently on ACE/ARB medication? Yes  Does the patient have >5 day gap between last estimated fill dates? No   Comprehensive medication review performed; Spoke to patient regarding cholesterol  Lipid Panel    Component Value Date/Time   CHOL 163 09/01/2020 1616   TRIG 82 09/01/2020 1616   HDL 55 09/01/2020 1616   LDLCALC 93 09/01/2020 1616    10-year ASCVD risk score: The 10-year ASCVD risk score Mikey Bussing DC Jr., et al., 2013) is: 14.7%   Values used to calculate the score:     Age: 43 years     Sex: Female     Is Non-Hispanic African American: Yes     Diabetic: No     Tobacco smoker: No     Systolic Blood Pressure: 937 mmHg     Is BP treated: Yes     HDL Cholesterol: 55 mg/dL     Total Cholesterol: 163 mg/dL   Current antihyperlipidemic regimen:  o Atorvastatin 10 mg one a day    Previous antihyperlipidemic medications tried: None   ASCVD risk enhancing conditions: age >76 and HTN    What recent interventions/DTPs have been made by any provider to improve Cholesterol control since last CPP Visit: Patient states she has been taking her medication as directed.   Any recent hospitalizations or ED visits since last visit with CPP? No   What diet changes have been made to improve Cholesterol?  o Patient states she is eating healthy, no salts , no fats also she states she is going to Pharmacy to pick up some vitamins that was recommended for her to take.   What exercise is being done to improve Cholesterol?  o Patient exercises three days a week, walking at mall for 30 minutes , also she does steps at home.   Adherence Review: Does the patient have >5 day gap between last estimated fill dates? no    Follow-Up:  Pharmacist Review - Patient states she is so happy with blood pressure numbers, and states she really is feeling good since she change her diet.  Vallie Pearson,CPP Notified  Judithann Sheen, [facility] Clinical Pharmacist Assistant [phone]

## 2020-10-26 ENCOUNTER — Other Ambulatory Visit: Payer: Self-pay | Admitting: Nurse Practitioner

## 2020-10-26 DIAGNOSIS — E079 Disorder of thyroid, unspecified: Secondary | ICD-10-CM

## 2020-11-02 ENCOUNTER — Other Ambulatory Visit: Payer: Self-pay | Admitting: Nurse Practitioner

## 2020-11-02 ENCOUNTER — Other Ambulatory Visit: Payer: Self-pay

## 2020-11-02 MED ORDER — FLUTICASONE PROPIONATE 50 MCG/ACT NA SUSP: 2.0000 | Freq: Every day | NASAL | 0 refills | Status: DC

## 2020-12-14 ENCOUNTER — Telehealth: Payer: Self-pay

## 2020-12-14 NOTE — Progress Notes (Deleted)
Chronic Care Management Pharmacy Note  12/14/2020 Name:  Janet Barber MRN:  [id number] DOB:  1940-09-25  Subjective: Janet Barber is an 80 y.o. year old female who is a primary patient of [name], [place].  The CCM team was consulted for assistance with disease management and care coordination needs.    {CCMTELEPHONEFACETOFACE:21091510} for {CCMINITIALFOLLOWUPCHOICE:21091511} in response to provider referral for pharmacy case management and/or care coordination services.   Consent to Services:  {CCMCONSENTOPTIONS:25074}  Patient Care Team: [name], FNP as PCP - General (General Practice)  Recent office visits: ***  Recent consult visits: [facility] visits: {Hospital DC Yes/No:25215}  Objective:  Lab Results  Component Value Date   CREATININE 0.93 09/01/2020   BUN 13 09/01/2020   GFR 73.10 06/21/2011   GFRNONAA 59 (L) 09/01/2020   GFRAA 68 09/01/2020   NA 141 09/01/2020   K 4.2 09/01/2020   CALCIUM 9.7 09/01/2020   CO2 25 09/01/2020   GLUCOSE 81 09/01/2020    Lab Results  Component Value Date/Time   GFR 73.10 06/21/2011 10:48 AM   MICROALBUR 150 09/01/2020 05:23 PM   MICROALBUR 10 06/19/2019 09:09 AM    Last diabetic Eye exam: No results found for: HMDIABEYEEXA  Last diabetic Foot exam: No results found for: HMDIABFOOTEX   Lab Results  Component Value Date   CHOL 163 09/01/2020   HDL 55 09/01/2020   LDLCALC 93 09/01/2020   TRIG 82 09/01/2020   CHOLHDL 3.0 09/01/2020    Hepatic Function Latest Ref Rng & Units 09/01/2020 03/30/2020 12/23/2019  Total Protein 6.0 - 8.5 g/dL 7.1 7.1 6.6  Albumin 3.7 - 4.7 g/dL 4.2 4.3 4.0  AST 0 - 40 IU/L '22 23 22  ' ALT 0 - 32 IU/L '15 16 13  ' Alk Phosphatase 44 - 121 IU/L 79 90 83  Total Bilirubin 0.0 - 1.2 mg/dL 0.5 0.6 0.7  Bilirubin, Direct 0.0 - 0.3 mg/dL - - -    Lab Results  Component Value Date/Time   TSH 3.740 03/30/2020 12:12 PM   TSH 3.450 12/23/2019 10:36 AM   FREET4 1.17 03/19/2019  10:26 AM    CBC Latest Ref Rng & Units 09/01/2020 03/19/2019 03/27/2018  WBC 3.4 - 10.8 x10E3/uL 5.8 5.6 5.0  Hemoglobin 11.1 - 15.9 g/dL 13.2 13.4 12.9  Hematocrit 34.0 - 46.6 % 39.9 40.2 41.3  Platelets 150 - 450 x10E3/uL 235 222 228    Lab Results  Component Value Date/Time   VD25OH 63.4 09/01/2020 04:11 PM   VD25OH 33.9 09/23/2019 10:35 AM    Clinical ASCVD: {YES/NO:21197} The 10-year ASCVD risk score Mikey Bussing DC Jr., et al., 2013) is: 15.4%   Values used to calculate the score:     Age: 64 years     Sex: Female     Is Non-Hispanic African American: Yes     Diabetic: No     Tobacco smoker: No     Systolic Blood Pressure: 211 mmHg     Is BP treated: Yes     HDL Cholesterol: 55 mg/dL     Total Cholesterol: 163 mg/dL    Depression screen [facility] 2/9 01/22/2020 12/23/2019 09/23/2019  Decreased Interest 0 0 0  Down, Depressed, Hopeless 0 0 0  PHQ - 2 Score 0 0 0  Altered sleeping 0 - -  Tired, decreased energy 0 - -  Change in appetite 0 - -  Feeling bad or failure about yourself  0 - -  Trouble concentrating 0 - -  Moving slowly or fidgety/restless 0 - -  Suicidal thoughts 0 - -  PHQ-9 Score 0 - -  Difficult doing work/chores Not difficult at all - -     ***Other: (CHADS2VASc if Afib, MMRC or CAT for COPD, ACT, DEXA)  Social History   Tobacco Use  Smoking Status Former Smoker  . Types: Cigarettes  . Quit date: 09/18/1986  . Years since quitting: 34.2  Smokeless Tobacco Never Used   BP Readings from Last 3 Encounters:  09/01/20 128/86  03/30/20 138/86  01/22/20 (!) 150/90   Pulse Readings from Last 3 Encounters:  09/01/20 83  03/30/20 79  01/22/20 (!) 118   Wt Readings from Last 3 Encounters:  09/01/20 176 lb 12.8 oz (80.2 kg)  03/30/20 176 lb (79.8 kg)  01/22/20 179 lb (81.2 kg)   BMI Readings from Last 3 Encounters:  09/01/20 30.73 kg/m  03/30/20 30.59 kg/m  01/22/20 31.11 kg/m    Assessment/Interventions: Review of patient past medical history,  allergies, medications, health status, including review of consultants reports, laboratory and other test data, was performed as part of comprehensive evaluation and provision of chronic care management services.   SDOH:  (Social Determinants of Health) assessments and interventions performed: {yes/no:20286}  SDOH Screenings   Alcohol Screen: Not on file  Depression (PHQ2-9): Low Risk   . PHQ-2 Score: 0  Financial Resource Strain: Low Risk   . Difficulty of Paying Living Expenses: Not hard at all  Food Insecurity: No Food Insecurity  . Worried About Charity fundraiser in the Last Year: Never true  . Ran Out of Food in the Last Year: Never true  Housing: Not on file  Physical Activity: Insufficiently Active  . Days of Exercise per Week: 3 days  . Minutes of Exercise per Session: 30 min  Social Connections: Not on file  Stress: No Stress Concern Present  . Feeling of Stress : Not at all  Tobacco Use: Medium Risk  . Smoking Tobacco Use: Former Smoker  . Smokeless Tobacco Use: Never Used  Transportation Needs: No Transportation Needs  . Lack of Transportation (Medical): No  . Lack of Transportation (Non-Medical): No    CCM Care Plan  Allergies  Allergen Reactions  . Shellfish Allergy Itching  . Penicillins     REACTION: unsure of reaction---years ago    Medications Reviewed Today    Reviewed by [name], FNP (Family Nurse Practitioner) on 09/01/20 at 26  Med List Status: <None>  Medication Order Taking? Sig Documenting Provider Last Dose Status Informant  atorvastatin (LIPITOR) 10 MG tablet [id number] Yes TAKE 1 TABLET BY MOUTH EVERY DAY [name], FNP Taking Active   carvedilol (COREG) 6.25 MG tablet [id number] No Take 1 tablet (6.25 mg total) by mouth 2 (two) times daily.  Patient not taking: Reported on 09/01/2020   Lelon Perla, MD Not Taking Active   fluticasone [facility]) 50 MCG/ACT nasal spray [id number] Yes Place 2 sprays into both nostrils daily.  [provider] Taking Active   hydrochlorothiazide (HYDRODIURIL) 25 MG tablet [id number] Yes Take 1/2 tablet by mouth daily [name], FNP Taking Active   levothyroxine (SYNTHROID) 25 MCG tablet [id number] No TAKE 1 TABLET(25 MCG) BY MOUTH DAILY  Patient not taking: Reported on 09/01/2020   [name], FNP Not Taking Active   valsartan (DIOVAN) 320 MG tablet [id number] Yes TAKE 1 TABLET BY MOUTH EVERY DAY  Patient taking differently: Taking 1/39m daily   MMinette Brine FNP Taking Active  Patient Active Problem List   Diagnosis Date Noted  . Mixed hyperlipidemia 12/16/2018  . Hypothyroidism 12/16/2018  . VENOUS INSUFFICIENCY 06/12/2009  . Vitamin D deficiency 06/09/2009  . DIVERTICULOSIS OF COLON 06/07/2008  . COLONIC POLYPS 06/03/2008  . HYPERCHOLESTEROLEMIA 06/03/2008  . OVERWEIGHT 06/03/2008  . ANXIETY 06/03/2008  . Essential hypertension 06/03/2008  . MITRAL VALVE PROLAPSE 06/03/2008  . ALLERGIC RHINITIS 06/03/2008    Immunization History  Administered Date(s) Administered  . Fluad Quad(high Dose 65+) 09/01/2020  . Influenza Whole 06/03/2008, 07/08/2009  . Influenza-Unspecified 07/18/2011, 06/18/2018, 06/16/2019  . Moderna Sars-Covid-2 Vaccination 10/14/2019, 11/11/2019  . PFIZER(Purple Top)SARS-COV-2 Vaccination 08/02/2020  . Pneumococcal Polysaccharide-23 06/09/2009  . Pneumococcal-Unspecified 06/21/2019  . Tdap 05/06/2013  . Zoster Recombinat (Shingrix) 06/21/2019, 08/29/2019    Conditions to be addressed/monitored:  {USCCMDZASSESSMENTOPTIONS:23563}  There are no care plans that you recently modified to display for this patient.    Medication Assistance: {MEDASSISTANCEINFO:25044}  Patient's preferred pharmacy is:  [facility] [phone] - [place], [place] - [phone] AT [address] [place] [address] Phone: [phone] Fax: [place], [place] [address] Phone: [phone] Fax: [phone]  Uses pill box? {Yes or If no, why not?:20788} Pt endorses ***% compliance  We discussed: {Pharmacy options:24294} Patient decided to: {US Pharmacy Plan:23885}  Care Plan and Follow Up Patient Decision:  {FOLLOWUP:24991}  Plan: {CM FOLLOW UP PLAN:25073}  ***   Current Barriers:  . {pharmacybarriers:24917} . ***  Pharmacist Clinical Goal(s):  Marland Kitchen Patient will {PHARMACYGOALCHOICES:24921} through collaboration with PharmD and provider.  . ***  Interventions: . 1:1 collaboration with [name], FNP regarding development and update of comprehensive plan of care as evidenced by provider attestation and co-signature . Inter-disciplinary care team collaboration (see longitudinal plan of care) . Comprehensive medication review performed; medication list updated in electronic medical record  {CCM PHARMD DISEASE STATES:25130}  Patient Goals/Self-Care Activities . Patient will:  - {pharmacypatientgoals:24919}  Follow Up Plan: {CM FOLLOW UP DTHY:38887}

## 2021-01-25 ENCOUNTER — Telehealth: Payer: Self-pay

## 2021-01-25 NOTE — Telephone Encounter (Signed)
Voicemail not set up, I returned the pt's call because because she said she may not be a ble to keep her appt Thursday. I called so that she could confirm the time.

## 2021-01-26 ENCOUNTER — Telehealth: Payer: Self-pay

## 2021-01-26 NOTE — Chronic Care Management (AMB) (Addendum)
Chronic Care Management Pharmacy Assistant   Name: Janet Barber  MRN: [id number] DOB: December 21, 1940   Reason for Encounter: Disease State/ Hypertension    Recent office visits:  None  Recent consult visits:  None  Hospital visits:  None in previous 6 months  Medications: Outpatient Encounter Medications as of 01/26/2021  Medication Sig  . atorvastatin (LIPITOR) 10 MG tablet TAKE 1 TABLET BY MOUTH EVERY DAY  . carvedilol (COREG) 6.25 MG tablet Take 1 tablet (6.25 mg total) by mouth 2 (two) times daily. (Patient not taking: Reported on 09/01/2020)  . fluticasone (FLONASE) 50 MCG/ACT nasal spray SHAKE LIQUID AND USE 2 SPRAYS IN EACH NOSTRIL DAILY  . hydrochlorothiazide (HYDRODIURIL) 25 MG tablet TAKE 1 TABLET BY MOUTH EVERY DAY  . levothyroxine (SYNTHROID) 25 MCG tablet TAKE 1 TABLET(25 MCG) BY MOUTH DAILY  . valsartan (DIOVAN) 160 MG tablet Take 1 tablet (160 mg total) by mouth daily.   No facility-administered encounter medications on file as of 01/26/2021.   Reviewed chart prior to disease state call. Spoke with patient regarding BP  Recent Office Vitals: BP Readings from Last 3 Encounters:  09/01/20 128/86  03/30/20 138/86  01/22/20 (!) 150/90   Pulse Readings from Last 3 Encounters:  09/01/20 83  03/30/20 79  01/22/20 (!) 118    Wt Readings from Last 3 Encounters:  09/01/20 176 lb 12.8 oz (80.2 kg)  03/30/20 176 lb (79.8 kg)  01/22/20 179 lb (81.2 kg)     Kidney Function Lab Results  Component Value Date/Time   CREATININE 0.93 09/01/2020 04:11 PM   CREATININE 0.99 03/30/2020 12:12 PM   GFR 73.10 06/21/2011 10:48 AM   GFRNONAA 59 (L) 09/01/2020 04:11 PM   GFRAA 68 09/01/2020 04:11 PM    BMP Latest Ref Rng & Units 09/01/2020 03/30/2020 12/23/2019  Glucose 65 - 99 mg/dL 81 85 95  BUN 8 - 27 mg/dL 13 14 17   Creatinine 0.57 - 1.00 mg/dL 0.93 0.99 0.97  BUN/Creat Ratio 12 - 28 14 14 18   Sodium 134 - 144 mmol/L 141 143 143  Potassium 3.5 - 5.2 mmol/L 4.2  3.7 3.5  Chloride 96 - 106 mmol/L 104 104 105  CO2 20 - 29 mmol/L 25 26 24   Calcium 8.7 - 10.3 mg/dL 9.7 9.7 9.6    . Current antihypertensive regimen:  o HCTZ- 25 mg daily. (Patient states she takes half daily) ? Carvedilol 6.25 mg tablet one tablet twice a day ? Valsartan 160 mg one tablet a day  . How often are you checking your Blood Pressure? Patient stated she doesn't check blood pressure at home    . Current home BP readings: None - . What recent interventions/DTPs have been made by any provider to improve Blood Pressure control since last CPP Visit: Patient states she is taking medications as directed and hasn't been having any symptoms of high blood pressure.  . Any recent hospitalizations or ED visits since last visit with CPP? No   . What diet changes have been made to improve Blood Pressure Control?  o Patient states she has decreased sodium intake. Patient states she has went from eating bacon daily to maybe twice weekly.  . What exercise is being done to improve your Blood Pressure Control?  o Patient stated she walks Monday, Wednesday and Friday at the mall for 30 minutes. Patient states she has a 2 story home so she goes up and down the stairs a lot.  Adherence Review: Is the  patient currently on ACE/ARB medication? Yes Does the patient have >5 day gap between last estimated fill dates? Yes  NOTES: Patient states she is content with her health right now and is full of energy. Sent scheduling a message to schedule patient with [name] on 04-05-2021 at 1:15.  Star Rating Drugs: Valsartan 160mg - Last filled 09-01-2020 [facility] Atorvastatin 10mg - Last filled 12-13-2020 [facility] [place] Clinical Pharmacist Assistant [phone]

## 2021-01-27 ENCOUNTER — Ambulatory Visit: Payer: Medicare HMO | Admitting: Nurse Practitioner

## 2021-01-27 ENCOUNTER — Ambulatory Visit: Payer: Medicare HMO

## 2021-02-01 ENCOUNTER — Other Ambulatory Visit: Payer: Self-pay | Admitting: Nurse Practitioner

## 2021-02-01 DIAGNOSIS — E079 Disorder of thyroid, unspecified: Secondary | ICD-10-CM

## 2021-02-02 ENCOUNTER — Ambulatory Visit: Payer: Medicare HMO | Admitting: Nurse Practitioner

## 2021-02-02 ENCOUNTER — Ambulatory Visit: Payer: Medicare HMO

## 2021-02-07 ENCOUNTER — Telehealth: Payer: Self-pay

## 2021-02-07 NOTE — Telephone Encounter (Signed)
Patient called in regards to her account with [place]. She stated her daughter Haddy Mullinax lives with her but she doesn't have any authority to call our office and inquire about any of her health info. She stated she is the one that makes the decisions and if this happens again she will take [place] to court.   I returned her call to get more info she stated someone from our office has been communicating with her daughter Lanelle Bal. I advised her that I was unsure of what she is talking about. I asked if she could provide me the name of the person her daughter has been communicating with she stated she is unsure if it was someone from our office she stated she is unsure of the person's name that it was a foreign name then she said she wasn't sure if her daughter was just making it up. I advised her that her daughter's name Lanelle Bal is on the patient contact and I offered to have it removed. She stated she does not know why it is on there because she did not give that to [place]. She stated she is not mad at [place] but she does not want this to happen again. I advised her that I would put a note in the chart to make sure we speak with her (patient) directly. YL,RMA

## 2021-03-10 ENCOUNTER — Ambulatory Visit (INDEPENDENT_AMBULATORY_CARE_PROVIDER_SITE_OTHER): Payer: Medicare HMO

## 2021-03-10 VITALS — Ht 66.0 in | Wt 146.0 lb

## 2021-03-10 DIAGNOSIS — Z0000 Encounter for general adult medical examination without abnormal findings: Secondary | ICD-10-CM

## 2021-03-10 DIAGNOSIS — Z23 Encounter for immunization: Secondary | ICD-10-CM

## 2021-03-10 MED ORDER — PREVNAR 20 0.5 ML IM SUSY: 0.5000 mL | PREFILLED_SYRINGE | INTRAMUSCULAR | 0 refills | Status: AC

## 2021-03-10 NOTE — Progress Notes (Signed)
I connected with [name] today by telephone and verified that I am speaking with the correct person using two identifiers. Location patient: home Location provider: work Persons participating in the virtual visit: Arcola Jansky LPN.   I discussed the limitations, risks, security and privacy concerns of performing an evaluation and management service by telephone and the availability of in person appointments. I also discussed with the patient that there may be a patient responsible charge related to this service. The patient expressed understanding and verbally consented to this telephonic visit.    Interactive audio and video telecommunications were attempted between this provider and patient, however failed, due to patient having technical difficulties OR patient did not have access to video capability.  We continued and completed visit with audio only.     Vital signs may be patient reported or missing.  Subjective:   Janet Barber is a 80 y.o. female who presents for Medicare Annual (Subsequent) preventive examination.  Review of Systems     Cardiac Risk Factors include: advanced age (>51men, >68 women);dyslipidemia;hypertension     Objective:    Today's Vitals   03/10/21 1526  Weight: 146 lb (66.2 kg)  Height: 5\' 6"  (1.676 m)   Body mass index is 23.57 kg/m.  Advanced Directives 03/10/2021 01/22/2020 06/19/2019 08/13/2018 04/02/2018 03/26/2018 10/03/2016  Does Patient Have a Medical Advance Directive? No No No No No No No  Would patient like information on creating a medical advance directive? - No - Patient declined Yes (MAU/Ambulatory/Procedural Areas - Information given) No - Patient declined No - Patient declined - No - Patient declined    Current Medications (verified) Outpatient Encounter Medications as of 03/10/2021  Medication Sig   aspirin EC 81 MG tablet Take 81 mg by mouth daily. Swallow whole.   atorvastatin (LIPITOR) 10 MG tablet TAKE 1 TABLET BY  MOUTH EVERY DAY   fluticasone (FLONASE) 50 MCG/ACT nasal spray SHAKE LIQUID AND USE 2 SPRAYS IN EACH NOSTRIL DAILY   hydrochlorothiazide (HYDRODIURIL) 25 MG tablet TAKE 1 TABLET BY MOUTH EVERY DAY   levothyroxine (SYNTHROID) 25 MCG tablet TAKE 1 TABLET(25 MCG) BY MOUTH DAILY   pneumococcal 20-Val Conj Vacc (PREVNAR 20) 0.5 ML SUSY Inject 0.5 mLs into the muscle tomorrow at 10 am for 1 dose.   valsartan (DIOVAN) 160 MG tablet Take 1 tablet (160 mg total) by mouth daily.   carvedilol (COREG) 6.25 MG tablet Take 1 tablet (6.25 mg total) by mouth 2 (two) times daily. (Patient not taking: No sig reported)   No facility-administered encounter medications on file as of 03/10/2021.    Allergies (verified) Shellfish allergy and Penicillins   History: Past Medical History:  Diagnosis Date   Allergic rhinitis    Anxiety    Colon cancer ([place])    Diverticulosis of colon    History of hemorrhoids    Hx of colonic polyps    Hypercholesterolemia    Hypertension    Hypothyroidism    Lumbar back pain    Mitral valve prolapse    Overweight(278.02)    Venous insufficiency    Vitamin D deficiency    Past Surgical History:  Procedure Laterality Date   ABDOMINAL HYSTERECTOMY  1974   benign breast biopsy  2006   BREAST REDUCTION SURGERY Bilateral 04/02/2018   Procedure: MAMMARY REDUCTION  (BREAST);  Surgeon: Irene Limbo, MD;  Location: [place];  Service: Plastics;  Laterality: Bilateral;   MASS EXCISION Left 04/02/2018   Procedure: EXCISION LEFT  BREAST MASS;  Surgeon: Irene Limbo, MD;  Location: [place] [facility];  Service: Plastics;  Laterality: Left;   UMBILICAL HERNIA REPAIR  1988   Family History  Problem Relation Age of Onset   Hypertension Maternal Grandmother    Social History   Socioeconomic History   Marital status: Widowed    Spouse name: Not on file   Number of children: 2   Years of education: Not on file   Highest education level: Not on  file  Occupational History   Occupation: retired  Tobacco Use   Smoking status: Former    Pack years: 0.00    Types: Cigarettes    Quit date: 09/18/1986    Years since quitting: 34.4   Smokeless tobacco: Never  Vaping Use   Vaping Use: Never used  Substance and Sexual Activity   Alcohol use: No   Drug use: No   Sexual activity: Not Currently  Other Topics Concern   Not on file  Social History Narrative   Not on file   Social Determinants of Health   Financial Resource Strain: Low Risk    Difficulty of Paying Living Expenses: Not hard at all  Food Insecurity: No Food Insecurity   Worried About Charity fundraiser in the Last Year: Never true   [place] in the Last Year: Never true  Transportation Needs: No Transportation Needs   Lack of Transportation (Medical): No   Lack of Transportation (Non-Medical): No  Physical Activity: Insufficiently Active   Days of Exercise per Week: 3 days   Minutes of Exercise per Session: 30 min  Stress: No Stress Concern Present   Feeling of Stress : Not at all  Social Connections: Not on file    Tobacco Counseling Counseling given: Not Answered   Clinical Intake:  Pre-visit preparation completed: Yes  Pain : No/denies pain     Nutritional Status: BMI of 19-24  Normal Nutritional Risks: None Diabetes: No  How often do you need to have someone help you when you read instructions, pamphlets, or other written materials from your doctor or pharmacy?: 1 - Never  Diabetic? no  Interpreter Needed?: No  Information entered by :: NAllen LPN   Activities of Daily Living In your present state of health, do you have any difficulty performing the following activities: 03/10/2021  Hearing? N  Vision? N  Difficulty concentrating or making decisions? N  Walking or climbing stairs? N  Dressing or bathing? N  Doing errands, shopping? N  Preparing Food and eating ? N  Using the Toilet? N  In the past six months, have you  accidently leaked urine? Y  Comment at night  Do you have problems with loss of bowel control? N  Managing your Medications? N  Managing your Finances? N  Housekeeping or managing your Housekeeping? N  Some recent data might be hidden    Patient Care Team: [name], FNP as PCP - General (General Practice)  Indicate any recent Medical Services you may have received from other than [facility] providers in the past year (date may be approximate).     Assessment:   This is a routine wellness examination for Janet Barber.  Hearing/Vision screen Vision Screening - Comments:: Regular eye exams,   Dietary issues and exercise activities discussed: Current Exercise Habits: Home exercise routine, Type of exercise: walking, Time (Minutes): 30, Frequency (Times/Week): 3, Weekly Exercise (Minutes/Week): 90   Goals Addressed  This Visit's Progress    Patient Stated       03/10/2021, no goals        Depression Screen PHQ 2/9 Scores 03/10/2021 01/22/2020 12/23/2019 09/23/2019 06/19/2019 03/19/2019 12/16/2018  PHQ - 2 Score 0 0 0 0 0 0 0  PHQ- 9 Score - 0 - - 0 - -    Fall Risk Fall Risk  03/10/2021 01/22/2020 12/23/2019 09/23/2019 06/19/2019  Falls in the past year? 0 0 0 0 0  Number falls in past yr: - - - - 0  Injury with Fall? - - - - -  Risk for fall due to : Medication side effect Medication side effect - - Medication side effect  Follow up Falls evaluation completed;Education provided;Falls prevention discussed Falls evaluation completed;Education provided;Falls prevention discussed - - Falls evaluation completed;Education provided;Falls prevention discussed    FALL RISK PREVENTION PERTAINING TO THE HOME:  Any stairs in or around the home? Yes  If so, are there any without handrails? No  Home free of loose throw rugs in walkways, pet beds, electrical cords, etc? Yes  Adequate lighting in your home to reduce risk of falls? Yes   ASSISTIVE DEVICES UTILIZED TO PREVENT FALLS:  Life alert?  No  Use of a cane, walker or w/c? No  Grab bars in the bathroom? No  Shower chair or bench in shower? No  Elevated toilet seat or a handicapped toilet? No   TIMED UP AND GO:  Was the test performed? No .      Cognitive Function:     6CIT Screen 03/10/2021 01/22/2020 06/19/2019 08/13/2018  What Year? 0 points 0 points 0 points 0 points  What month? 0 points 0 points 0 points 0 points  What time? 0 points 0 points 0 points 0 points  Count back from 20 0 points 0 points 0 points 0 points  Months in reverse 0 points 0 points 0 points 0 points  Repeat phrase 10 points 2 points 0 points 6 points  Total Score 10 2 0 6    Immunizations Immunization History  Administered Date(s) Administered   Fluad Quad(high Dose 65+) 09/01/2020   Influenza Whole 06/03/2008, 07/08/2009   Influenza-Unspecified 07/18/2011, 06/18/2018, 06/16/2019   Moderna Sars-Covid-2 Vaccination 10/14/2019, 11/11/2019   PFIZER(Purple Top)SARS-COV-2 Vaccination 08/02/2020, 01/04/2021   Pneumococcal Polysaccharide-23 06/09/2009   Pneumococcal-Unspecified 06/21/2019   Tdap 05/06/2013   Zoster Recombinat (Shingrix) 06/21/2019, 08/29/2019    TDAP status: Up to date  Flu Vaccine status: Up to date  Pneumococcal vaccine status: Due, Education has been provided regarding the importance of this vaccine. Advised may receive this vaccine at local pharmacy or Health Dept. Aware to provide a copy of the vaccination record if obtained from local pharmacy or Health Dept. Verbalized acceptance and understanding.  Covid-19 vaccine status: Completed vaccines  Qualifies for Shingles Vaccine? Yes   Zostavax completed No   Shingrix Completed?: Yes  Screening Tests Health Maintenance  Topic Date Due   DEXA SCAN  Never done   PNA vac Low Risk Adult (2 of 2 - PCV13) 06/20/2020   INFLUENZA VACCINE  04/18/2021   TETANUS/TDAP  05/07/2023   COVID-19 Vaccine  Completed   Hepatitis C Screening  Completed   Zoster Vaccines- Shingrix   Completed   HPV VACCINES  Aged Out    Health Maintenance  Health Maintenance Due  Topic Date Due   DEXA SCAN  Never done   PNA vac Low Risk Adult (2 of 2 - PCV13) 06/20/2020  Colorectal cancer screening: No longer required.   Mammogram status: No longer required due to age.  Bone Density status: due  Lung Cancer Screening: (Low Dose CT Chest recommended if Age 15-80 years, 30 pack-year currently smoking OR have quit w/in 15years.) does not qualify.   Lung Cancer Screening Referral: no  Additional Screening:  Hepatitis C Screening: does qualify; Completed 09/01/2020  Vision Screening: Recommended annual ophthalmology exams for early detection of glaucoma and other disorders of the eye. Is the patient up to date with their annual eye exam?  Yes  Who is the provider or what is the name of the office in which the patient attends annual eye exams? [facility] If pt is not established with a provider, would they like to be referred to a provider to establish care? No .   Dental Screening: Recommended annual dental exams for proper oral hygiene  Community Resource Referral / Chronic Care Management: CRR required this visit?  No   CCM required this visit?  No      Plan:     I have personally reviewed and noted the following in the patient's chart:   Medical and social history Use of alcohol, tobacco or illicit drugs  Current medications and supplements including opioid prescriptions.  Functional ability and status Nutritional status Physical activity Advanced directives List of other physicians Hospitalizations, surgeries, and ER visits in previous 12 months Vitals Screenings to include cognitive, depression, and falls Referrals and appointments  In addition, I have reviewed and discussed with patient certain preventive protocols, quality metrics, and best practice recommendations. A written personalized care plan for preventive services as well as general preventive  health recommendations were provided to patient.     Kellie Simmering, LPN   9/48/0165   Nurse Notes:

## 2021-03-10 NOTE — Patient Instructions (Signed)
Janet Barber , Thank you for taking time to come for your Medicare Wellness Visit. I appreciate your ongoing commitment to your health goals. Please review the following plan we discussed and let me know if I can assist you in the future.   Screening recommendations/referrals: Colonoscopy: not required Mammogram: not required Bone Density: due Recommended yearly ophthalmology/optometry visit for glaucoma screening and checkup Recommended yearly dental visit for hygiene and checkup  Vaccinations: Influenza vaccine: completed 09/01/2020, due 04/18/2021 Pneumococcal vaccine: sent to pharmacy Tdap vaccine: completed 05/06/2013, due 05/07/2023 Shingles vaccine: completed   Covid-19: 01/04/2021, 08/02/2020, 11/11/2019, 10/14/2019  Advanced directives: Advance directive discussed with you today.   Conditions/risks identified: none  Next appointment: Follow up in one year for your annual wellness visit    Preventive Care 65 Years and Older, Female Preventive care refers to lifestyle choices and visits with your health care provider that can promote health and wellness. What does preventive care include? A yearly physical exam. This is also called an annual well check. Dental exams once or twice a year. Routine eye exams. Ask your health care provider how often you should have your eyes checked. Personal lifestyle choices, including: Daily care of your teeth and gums. Regular physical activity. Eating a healthy diet. Avoiding tobacco and drug use. Limiting alcohol use. Practicing safe sex. Taking low-dose aspirin every day. Taking vitamin and mineral supplements as recommended by your health care provider. What happens during an annual well check? The services and screenings done by your health care provider during your annual well check will depend on your age, overall health, lifestyle risk factors, and family history of disease. Counseling  Your health care provider may ask you questions  about your: Alcohol use. Tobacco use. Drug use. Emotional well-being. Home and relationship well-being. Sexual activity. Eating habits. History of falls. Memory and ability to understand (cognition). Work and work Statistician. Reproductive health. Screening  You may have the following tests or measurements: Height, weight, and BMI. Blood pressure. Lipid and cholesterol levels. These may be checked every 5 years, or more frequently if you are over 58 years old. Skin check. Lung cancer screening. You may have this screening every year starting at age 50 if you have a 30-pack-year history of smoking and currently smoke or have quit within the past 15 years. Fecal occult blood test (FOBT) of the stool. You may have this test every year starting at age 40. Flexible sigmoidoscopy or colonoscopy. You may have a sigmoidoscopy every 5 years or a colonoscopy every 10 years starting at age 77. Hepatitis C blood test. Hepatitis B blood test. Sexually transmitted disease (STD) testing. Diabetes screening. This is done by checking your blood sugar (glucose) after you have not eaten for a while (fasting). You may have this done every 1-3 years. Bone density scan. This is done to screen for osteoporosis. You may have this done starting at age 52. Mammogram. This may be done every 1-2 years. Talk to your health care provider about how often you should have regular mammograms. Talk with your health care provider about your test results, treatment options, and if necessary, the need for more tests. Vaccines  Your health care provider may recommend certain vaccines, such as: Influenza vaccine. This is recommended every year. Tetanus, diphtheria, and acellular pertussis (Tdap, Td) vaccine. You may need a Td booster every 10 years. Zoster vaccine. You may need this after age 24. Pneumococcal 13-valent conjugate (PCV13) vaccine. One dose is recommended after age 55. Pneumococcal polysaccharide (PPSV23)  vaccine. One dose is recommended after age 25. Talk to your health care provider about which screenings and vaccines you need and how often you need them. This information is not intended to replace advice given to you by your health care provider. Make sure you discuss any questions you have with your health care provider. Document Released: 10/01/2015 Document Revised: 05/24/2016 Document Reviewed: 07/06/2015 Elsevier Interactive Patient Education  2017 [employer] Prevention in the Home Falls can cause injuries. They can happen to people of all ages. There are many things you can do to make your home safe and to help prevent falls. What can I do on the outside of my home? Regularly fix the edges of walkways and driveways and fix any cracks. Remove anything that might make you trip as you walk through a door, such as a raised step or threshold. Trim any bushes or trees on the path to your home. Use bright outdoor lighting. Clear any walking paths of anything that might make someone trip, such as rocks or tools. Regularly check to see if handrails are loose or broken. Make sure that both sides of any steps have handrails. Any raised decks and porches should have guardrails on the edges. Have any leaves, snow, or ice cleared regularly. Use sand or salt on walking paths during winter. Clean up any spills in your garage right away. This includes oil or grease spills. What can I do in the bathroom? Use night lights. Install grab bars by the toilet and in the tub and shower. Do not use towel bars as grab bars. Use non-skid mats or decals in the tub or shower. If you need to sit down in the shower, use a plastic, non-slip stool. Keep the floor dry. Clean up any water that spills on the floor as soon as it happens. Remove soap buildup in the tub or shower regularly. Attach bath mats securely with double-sided non-slip rug tape. Do not have throw rugs and other things on the floor that  can make you trip. What can I do in the bedroom? Use night lights. Make sure that you have a light by your bed that is easy to reach. Do not use any sheets or blankets that are too big for your bed. They should not hang down onto the floor. Have a firm chair that has side arms. You can use this for support while you get dressed. Do not have throw rugs and other things on the floor that can make you trip. What can I do in the kitchen? Clean up any spills right away. Avoid walking on wet floors. Keep items that you use a lot in easy-to-reach places. If you need to reach something above you, use a strong step stool that has a grab bar. Keep electrical cords out of the way. Do not use floor polish or wax that makes floors slippery. If you must use wax, use non-skid floor wax. Do not have throw rugs and other things on the floor that can make you trip. What can I do with my stairs? Do not leave any items on the stairs. Make sure that there are handrails on both sides of the stairs and use them. Fix handrails that are broken or loose. Make sure that handrails are as long as the stairways. Check any carpeting to make sure that it is firmly attached to the stairs. Fix any carpet that is loose or worn. Avoid having throw rugs at the top or bottom of  the stairs. If you do have throw rugs, attach them to the floor with carpet tape. Make sure that you have a light switch at the top of the stairs and the bottom of the stairs. If you do not have them, ask someone to add them for you. What else can I do to help prevent falls? Wear shoes that: Do not have high heels. Have rubber bottoms. Are comfortable and fit you well. Are closed at the toe. Do not wear sandals. If you use a stepladder: Make sure that it is fully opened. Do not climb a closed stepladder. Make sure that both sides of the stepladder are locked into place. Ask someone to hold it for you, if possible. Clearly mark and make sure that you  can see: Any grab bars or handrails. First and last steps. Where the edge of each step is. Use tools that help you move around (mobility aids) if they are needed. These include: Canes. Walkers. Scooters. Crutches. Turn on the lights when you go into a dark area. Replace any light bulbs as soon as they burn out. Set up your furniture so you have a clear path. Avoid moving your furniture around. If any of your floors are uneven, fix them. If there are any pets around you, be aware of where they are. Review your medicines with your doctor. Some medicines can make you feel dizzy. This can increase your chance of falling. Ask your doctor what other things that you can do to help prevent falls. This information is not intended to replace advice given to you by your health care provider. Make sure you discuss any questions you have with your health care provider. Document Released: 07/01/2009 Document Revised: 02/10/2016 Document Reviewed: 10/09/2014 Elsevier Interactive Patient Education  2017 [employer].

## 2021-03-13 ENCOUNTER — Other Ambulatory Visit: Payer: Self-pay | Admitting: Nurse Practitioner

## 2021-03-13 DIAGNOSIS — I10 Essential (primary) hypertension: Secondary | ICD-10-CM

## 2021-03-13 DIAGNOSIS — E7800 Pure hypercholesterolemia, unspecified: Secondary | ICD-10-CM

## 2021-04-04 ENCOUNTER — Telehealth: Payer: Self-pay

## 2021-04-04 NOTE — Chronic Care Management (AMB) (Signed)
    No answer, left message of telephone appointment with Nelva Bush CPP on 04-05-2021 at 1:15. Left message to have all medications, supplements, blood pressure and/or blood sugar logs available during appointment and to return call if need to reschedule.   [place] Pharmacist Assistant [phone]

## 2021-04-05 ENCOUNTER — Ambulatory Visit (INDEPENDENT_AMBULATORY_CARE_PROVIDER_SITE_OTHER): Payer: Medicare HMO

## 2021-04-05 DIAGNOSIS — I10 Essential (primary) hypertension: Secondary | ICD-10-CM | POA: Diagnosis not present

## 2021-04-05 DIAGNOSIS — E782 Mixed hyperlipidemia: Secondary | ICD-10-CM

## 2021-04-05 NOTE — Progress Notes (Signed)
Chronic Care Management Pharmacy Note  04/05/2021 Name:  Janet Barber MRN:  [id number] DOB:  1940-11-30  Summary: Patient reports she is getting plenty of exercise   Recommendations/Changes made from today's visit: Recommend patient check BP and log her readings to bring to her next office visit.  Recommend patient receive a DEXA scan.  Recommend patient receive pneumonia vaccine   Plan: Patient is going to record her BP readings in a log book.  Patient is going to get a DEXA scan completed.    Subjective: Janet Barber is an 80 y.o. year old female who is a primary patient of Janet Barber, [place].  The CCM team was consulted for assistance with disease management and care coordination needs.    Engaged with patient by telephone for follow up visit in response to provider referral for pharmacy case management and/or care coordination services. Patient reports that she usually does not need to go to the bathroom at night, but recently she had to go to the bathroom. She is still walking up and down the stairs everyday, she is worried about going to the bathroom in at night more frequently.   Consent to Services:  The patient was given information about Chronic Care Management services, agreed to services, and gave verbal consent prior to initiation of services.  Please see initial visit note for detailed documentation.   Patient Care Team: Janet Brine, FNP as PCP - General (General Practice)  Recent office visits: 09/01/2020 PCP OV   Recent consult visits: None in the previous 6 months   Hospital visits: None in previous 6 months   Objective:  Lab Results  Component Value Date   CREATININE 0.93 09/01/2020   BUN 13 09/01/2020   GFR 73.10 06/21/2011   GFRNONAA 59 (L) 09/01/2020   GFRAA 68 09/01/2020   NA 141 09/01/2020   K 4.2 09/01/2020   CALCIUM 9.7 09/01/2020   CO2 25 09/01/2020   GLUCOSE 81 09/01/2020    Lab Results  Component Value Date/Time    GFR 73.10 06/21/2011 10:48 AM   MICROALBUR 150 09/01/2020 05:23 PM   MICROALBUR 10 06/19/2019 09:09 AM    Last diabetic Eye exam: No results found for: HMDIABEYEEXA  Last diabetic Foot exam: No results found for: HMDIABFOOTEX   Lab Results  Component Value Date   CHOL 163 09/01/2020   HDL 55 09/01/2020   LDLCALC 93 09/01/2020   TRIG 82 09/01/2020   CHOLHDL 3.0 09/01/2020    Hepatic Function Latest Ref Rng & Units 09/01/2020 03/30/2020 12/23/2019  Total Protein 6.0 - 8.5 g/dL 7.1 7.1 6.6  Albumin 3.7 - 4.7 g/dL 4.2 4.3 4.0  AST 0 - 40 IU/L '22 23 22  ' ALT 0 - 32 IU/L '15 16 13  ' Alk Phosphatase 44 - 121 IU/L 79 90 83  Total Bilirubin 0.0 - 1.2 mg/dL 0.5 0.6 0.7  Bilirubin, Direct 0.0 - 0.3 mg/dL - - -    Lab Results  Component Value Date/Time   TSH 3.740 03/30/2020 12:12 PM   TSH 3.450 12/23/2019 10:36 AM   FREET4 1.17 03/19/2019 10:26 AM    CBC Latest Ref Rng & Units 09/01/2020 03/19/2019 03/27/2018  WBC 3.4 - 10.8 x10E3/uL 5.8 5.6 5.0  Hemoglobin 11.1 - 15.9 g/dL 13.2 13.4 12.9  Hematocrit 34.0 - 46.6 % 39.9 40.2 41.3  Platelets 150 - 450 x10E3/uL 235 222 228    Lab Results  Component Value Date/Time   VD25OH 63.4 09/01/2020 04:11 PM  VD25OH 33.9 09/23/2019 10:35 AM    Clinical ASCVD: No  The ASCVD Risk score Mikey Bussing DC Jr., et al., 2013) failed to calculate for the following reasons:   The systolic blood pressure is missing    Depression screen [facility] 2/9 03/10/2021 01/22/2020 12/23/2019  Decreased Interest 0 0 0  Down, Depressed, Hopeless 0 0 0  PHQ - 2 Score 0 0 0  Altered sleeping - 0 -  Tired, decreased energy - 0 -  Change in appetite - 0 -  Feeling bad or failure about yourself  - 0 -  Trouble concentrating - 0 -  Moving slowly or fidgety/restless - 0 -  Suicidal thoughts - 0 -  PHQ-9 Score - 0 -  Difficult doing work/chores - Not difficult at all -      Social History   Tobacco Use  Smoking Status Former   Types: Cigarettes   Quit date: 09/18/1986   Years  since quitting: 34.5  Smokeless Tobacco Never   BP Readings from Last 3 Encounters:  09/01/20 128/86  03/30/20 138/86  01/22/20 (!) 150/90   Pulse Readings from Last 3 Encounters:  09/01/20 83  03/30/20 79  01/22/20 (!) 118   Wt Readings from Last 3 Encounters:  03/10/21 146 lb (66.2 kg)  09/01/20 176 lb 12.8 oz (80.2 kg)  03/30/20 176 lb (79.8 kg)   BMI Readings from Last 3 Encounters:  03/10/21 23.57 kg/m  09/01/20 30.73 kg/m  03/30/20 30.59 kg/m    Assessment/Interventions: Review of patient past medical history, allergies, medications, health status, including review of consultants reports, laboratory and other test data, was performed as part of comprehensive evaluation and provision of chronic care management services.   SDOH:  (Social Determinants of Health) assessments and interventions performed: No  SDOH Screenings   Alcohol Screen: Not on file  Depression (PHQ2-9): Low Risk    PHQ-2 Score: 0  Financial Resource Strain: Low Risk    Difficulty of Paying Living Expenses: Not hard at all  Food Insecurity: No Food Insecurity   Worried About Charity fundraiser in the Last Year: Never true   Ran Out of Food in the Last Year: Never true  Housing: Not on file  Physical Activity: Insufficiently Active   Days of Exercise per Week: 3 days   Minutes of Exercise per Session: 30 min  Social Connections: Not on file  Stress: No Stress Concern Present   Feeling of Stress : Not at all  Tobacco Use: Medium Risk   Smoking Tobacco Use: Former   Smokeless Tobacco Use: Never  Transportation Needs: No Transportation Needs   Lack of Transportation (Medical): No   Lack of Transportation (Non-Medical): No    CCM Care Plan  Allergies  Allergen Reactions   Shellfish Allergy Itching   Penicillins     REACTION: unsure of reaction---years ago    Medications Reviewed Today     Reviewed by Kellie Simmering, LPN (Licensed Practical Nurse) on 03/10/21 at 89  Med List  Status: <None>   Medication Order Taking? Sig Documenting Provider Last Dose Status Informant  aspirin EC 81 MG tablet [id number] Yes Take 81 mg by mouth daily. Swallow whole. [provider] Taking Active Self  atorvastatin (LIPITOR) 10 MG tablet [id number] Yes TAKE 1 TABLET BY MOUTH EVERY DAY Janet Brine, FNP Taking Active   carvedilol (COREG) 6.25 MG tablet [id number] No Take 1 tablet (6.25 mg total) by mouth 2 (two) times daily.  Patient not taking: No sig  reported   Lelon Perla, MD Not Taking Active   fluticasone (FLONASE) 50 MCG/ACT nasal spray [id number] Yes SHAKE LIQUID AND USE 2 SPRAYS IN EACH NOSTRIL DAILY Janet Brine, FNP Taking Active   hydrochlorothiazide (HYDRODIURIL) 25 MG tablet [id number] Yes TAKE 1 TABLET BY MOUTH EVERY DAY Janet Brine, FNP Taking Active   levothyroxine (SYNTHROID) 25 MCG tablet [id number] Yes TAKE 1 TABLET(25 MCG) BY MOUTH DAILY Janet Brine, FNP Taking Active   valsartan (DIOVAN) 160 MG tablet [id number] Yes Take 1 tablet (160 mg total) by mouth daily. Janet Brine, FNP Taking Active             Patient Active Problem List   Diagnosis Date Noted   Mixed hyperlipidemia 12/16/2018   Hypothyroidism 12/16/2018   VENOUS INSUFFICIENCY 06/12/2009   Vitamin D deficiency 06/09/2009   DIVERTICULOSIS OF COLON 06/07/2008   COLONIC POLYPS 06/03/2008   HYPERCHOLESTEROLEMIA 06/03/2008   OVERWEIGHT 06/03/2008   ANXIETY 06/03/2008   Essential hypertension 06/03/2008   MITRAL VALVE PROLAPSE 06/03/2008   ALLERGIC RHINITIS 06/03/2008    Immunization History  Administered Date(s) Administered   Fluad Quad(high Dose 65+) 09/01/2020   Influenza Whole 06/03/2008, 07/08/2009   Influenza-Unspecified 07/18/2011, 06/18/2018, 06/16/2019   Moderna Sars-Covid-2 Vaccination 10/14/2019, 11/11/2019   PFIZER(Purple Top)SARS-COV-2 Vaccination 08/02/2020, 01/04/2021   Pneumococcal Polysaccharide-23 06/09/2009   Pneumococcal-Unspecified 06/21/2019   Tdap  05/06/2013   Zoster Recombinat (Shingrix) 06/21/2019, 08/29/2019    Conditions to be addressed/monitored:  Hypertension and Hyperlipidemia  There are no care plans that you recently modified to display for this patient.    Medication Assistance: None required.  Patient affirms current coverage meets needs.  Compliance/Adherence/Medication fill history: Care Gaps: DEXA scan, Tuesday or Thursday   Star-Rating Drugs: Atorvastatin 10 mg  Valsartan 160 mg   Patient's preferred pharmacy is:  [facility] [phone] - [place], [address] [address] Phone: [phone] Fax: [phone]  [place], [address] [place] [address] Phone: [phone] Fax: [phone]  Uses pill box? No - patient reports she will begin using a pill box Pt endorses 88% compliance  We discussed: Benefits of medication synchronization, packaging and delivery as well as enhanced pharmacist oversight with [employer]. Patient decided to: Continue current medication management strategy  Care Plan and Follow Up Patient Decision:  Patient agrees to Care Plan and Follow-up.  Plan: Telephone follow up appointment with care management team member scheduled for:  09/07/2021  and The patient has been provided with contact information for the care management team and has been advised to call with any health related questions or concerns.   [name], PharmD Clinical Pharmacist [facility] [phone]

## 2021-04-12 NOTE — Patient Instructions (Signed)
Visit Information It was great speaking with you today!  Please let me know if you have any questions about our visit.   Goals Addressed             This Visit's Progress    Manage My Medicine       Timeframe:  Long-Range Goal Priority:  High Start Date:                             Expected End Date:                       Follow Up Date 09/07/2021    - call for medicine refill 2 or 3 days before it runs out - call if I am sick and can't take my medicine - keep a list of all the medicines I take; vitamins and herbals too - use a pillbox to sort medicine - use an alarm clock or phone to remind me to take my medicine    Why is this important?   These steps will help you keep on track with your medicines.   Notes:  Please call if you have any questions.         Patient Care Plan: CCM Pharmacy Care Plan     Problem Identified: HTN, HLD      Long-Range Goal: Diseae Management   This Visit's Progress: On track  Priority: High  Note:   Current Barriers:  Unable to independently monitor therapeutic efficacy  Pharmacist Clinical Goal(s):  Patient will achieve adherence to monitoring guidelines and medication adherence to achieve therapeutic efficacy through collaboration with PharmD and provider.   Interventions: 1:1 collaboration with [name], FNP regarding development and update of comprehensive plan of care as evidenced by provider attestation and co-signature Inter-disciplinary care team collaboration (see longitudinal plan of care) Comprehensive medication review performed; medication list updated in electronic medical record  Hypertension (BP goal <140/90) -Controlled -Current treatment: Hydrochlorothiazide 25 mg tablet taking 1 tablet by mouth daily  Valsartan 160 mg taking a tablet by mouth daily.  Patient reports that she took a 1/2 tablet today.  Carvedilol 6.25 mg tablet twice per day  -Current home readings: not currently checking her BP at home   -Current dietary habits: please see hyperlipidemia -Current exercise habits: please see hyperlipidemia  -Denies hypotensive/hypertensive symptoms -Educated on Daily salt intake goal < 2300 mg; Importance of home blood pressure monitoring; Proper BP monitoring technique; -Counseled to monitor BP at home at least once per week, document, and provide log at future appointments -Recommended to continue current medication  Hyperlipidemia: (LDL goal < 100) -Controlled -Current treatment: Atorvastatin 10 mg taking 1 tablet by mouth daily  -Current dietary patterns: she reports that her eating habits are fine. She is eating cereal with a banana, or grits with scramble eggs and milk, she has a salad, or a meal from [employer] where she eats half in the evening and the other half at lunch.  -Current exercise habits: Monday, Wednesday and Friday at the mall for three levels, and walks up and down the stairs at least 4 times per day. -Educated on Cholesterol goals;  Exercise goal of 150 minutes per week; continue  -Recommended to continue current medication  Patient Goals/Self-Care Activities Patient will:  - take medications as prescribed  Follow Up Plan: The patient has been provided with contact information for the care management team and has been advised  to call with any health related questions or concerns.       Patient agreed to services and verbal consent obtained.   The patient verbalized understanding of instructions, educational materials, and care plan provided today and agreed to receive a mailed copy of patient instructions, educational materials, and care plan.   [name], PharmD Clinical Pharmacist [facility] [phone]

## 2021-06-17 ENCOUNTER — Ambulatory Visit: Payer: Medicare HMO | Attending: Internal Medicine

## 2021-06-17 ENCOUNTER — Other Ambulatory Visit: Payer: Self-pay | Admitting: Cardiology

## 2021-06-17 ENCOUNTER — Other Ambulatory Visit: Payer: Self-pay | Admitting: Nurse Practitioner

## 2021-06-17 ENCOUNTER — Other Ambulatory Visit (HOSPITAL_BASED_OUTPATIENT_CLINIC_OR_DEPARTMENT_OTHER): Payer: Self-pay

## 2021-06-17 DIAGNOSIS — I10 Essential (primary) hypertension: Secondary | ICD-10-CM

## 2021-06-17 DIAGNOSIS — E7800 Pure hypercholesterolemia, unspecified: Secondary | ICD-10-CM

## 2021-06-17 DIAGNOSIS — E079 Disorder of thyroid, unspecified: Secondary | ICD-10-CM

## 2021-06-17 DIAGNOSIS — Z23 Encounter for immunization: Secondary | ICD-10-CM

## 2021-06-17 MED ORDER — CARVEDILOL 6.25 MG PO TABS
6.2500 mg | ORAL_TABLET | Freq: Two times a day (BID) | ORAL | 0 refills | Status: DC
  Filled 2021-06-17: qty 60, 30d supply, fill #0

## 2021-06-17 MED ORDER — VALSARTAN 160 MG PO TABS
160.0000 mg | ORAL_TABLET | Freq: Every day | ORAL | 1 refills | Status: DC
Start: 2021-03-08 — End: 2021-09-06

## 2021-06-17 NOTE — Progress Notes (Signed)
   Covid-19 Vaccination Clinic  Name:  Janet Barber    MRN: [id number] DOB: December 13, 1940  06/17/2021  Janet Barber was observed post Covid-19 immunization for 15 minutes without incident. She was provided with Vaccine Information Sheet and instruction to access the V-Safe system.   Janet Barber was instructed to call [phone] with any severe reactions post vaccine: Difficulty breathing  Swelling of face and throat  A fast heartbeat  A bad rash all over body  Dizziness and weakness

## 2021-06-20 ENCOUNTER — Other Ambulatory Visit (HOSPITAL_BASED_OUTPATIENT_CLINIC_OR_DEPARTMENT_OTHER): Payer: Self-pay

## 2021-06-20 MED ORDER — ATORVASTATIN CALCIUM 10 MG PO TABS
10.0000 mg | ORAL_TABLET | Freq: Every day | ORAL | 1 refills | Status: DC
  Filled 2021-06-20: qty 90, 90d supply, fill #0

## 2021-06-20 MED ORDER — LEVOTHYROXINE SODIUM 25 MCG PO TABS
ORAL_TABLET | ORAL | 0 refills | Status: DC
  Filled 2021-06-20: qty 90, fill #0

## 2021-06-20 MED ORDER — HYDROCHLOROTHIAZIDE 25 MG PO TABS
25.0000 mg | ORAL_TABLET | Freq: Every day | ORAL | 1 refills | Status: DC
  Filled 2021-06-20: qty 90, 90d supply, fill #0

## 2021-06-20 MED ORDER — FLUTICASONE PROPIONATE 50 MCG/ACT NA SUSP
NASAL | 2 refills | Status: DC
  Filled 2021-06-20: qty 48, fill #0

## 2021-06-28 ENCOUNTER — Other Ambulatory Visit (HOSPITAL_BASED_OUTPATIENT_CLINIC_OR_DEPARTMENT_OTHER): Payer: Self-pay

## 2021-06-28 MED ORDER — COVID-19MRNA BIVAL VACC PFIZER 30 MCG/0.3ML IM SUSP
INTRAMUSCULAR | 0 refills | Status: DC
  Filled 2021-06-28: qty 0.3, 1d supply, fill #0

## 2021-07-06 ENCOUNTER — Telehealth: Payer: Self-pay

## 2021-07-06 NOTE — Chronic Care Management (AMB) (Signed)
    Chronic Care Management Pharmacy Assistant   Name: Janet Barber  MRN: [id number] DOB: Jun 24, 1941  Reason for Encounter: Disease State/ Hypertension  Recent office visits:  None  Recent consult visits:  06-17-2021 [facility]. Covid booster given  Hospital visits:  None in previous 6 months  Medications: Outpatient Encounter Medications as of 07/06/2021  Medication Sig   aspirin EC 81 MG tablet Take 81 mg by mouth daily. Swallow whole.   atorvastatin (LIPITOR) 10 MG tablet TAKE 1 TABLET BY MOUTH EVERY DAY   carvedilol (COREG) 6.25 MG tablet Take 1 tablet (6.25 mg total) by mouth 2 (two) times daily. PATIENT MUST SCHEDULE APPOINTMENT FOR FUTURE REFILLS.   COVID-19 mRNA bivalent vaccine, Pfizer, injection Inject into the muscle.   fluticasone (FLONASE) 50 MCG/ACT nasal spray SHAKE LIQUID AND USE 2 SPRAYS IN EACH NOSTRIL DAILY   hydrochlorothiazide (HYDRODIURIL) 25 MG tablet TAKE 1 TABLET BY MOUTH EVERY DAY   levothyroxine (SYNTHROID) 25 MCG tablet TAKE 1 TABLET(25 MCG) BY MOUTH DAILY   valsartan (DIOVAN) 160 MG tablet Take 1 tablet (160 mg total) by mouth daily.   valsartan (DIOVAN) 160 MG tablet Take 1 tablet (160 mg total) by mouth daily.   No facility-administered encounter medications on file as of 07/06/2021.  Reviewed chart prior to disease state call. Spoke with patient regarding BP  Recent Office Vitals: BP Readings from Last 3 Encounters:  09/01/20 128/86  03/30/20 138/86  01/22/20 (!) 150/90   Pulse Readings from Last 3 Encounters:  09/01/20 83  03/30/20 79  01/22/20 (!) 118    Wt Readings from Last 3 Encounters:  03/10/21 146 lb (66.2 kg)  09/01/20 176 lb 12.8 oz (80.2 kg)  03/30/20 176 lb (79.8 kg)     Kidney Function Lab Results  Component Value Date/Time   CREATININE 0.93 09/01/2020 04:11 PM   CREATININE 0.99 03/30/2020 12:12 PM   GFR 73.10 06/21/2011 10:48 AM   GFRNONAA 59 (L) 09/01/2020 04:11 PM   GFRAA 68 09/01/2020  04:11 PM    BMP Latest Ref Rng & Units 09/01/2020 03/30/2020 12/23/2019  Glucose 65 - 99 mg/dL 81 85 95  BUN 8 - 27 mg/dL 13 14 17   Creatinine 0.57 - 1.00 mg/dL 0.93 0.99 0.97  BUN/Creat Ratio 12 - 28 14 14 18   Sodium 134 - 144 mmol/L 141 143 143  Potassium 3.5 - 5.2 mmol/L 4.2 3.7 3.5  Chloride 96 - 106 mmol/L 104 104 105  CO2 20 - 29 mmol/L 25 26 24   Calcium 8.7 - 10.3 mg/dL 9.7 9.7 9.6    07-06-2021: 1st attempt left VM 07-12-2021: 2nd attempt left VM 07-14-2021: 3rd attempt left VM  Care Gaps: Pneumonia vaccine overdue last completed 06-21-2019 Flu vaccine overdue last completed 09-01-2020 AWV 03-08-2022  Star Rating Drugs: Valsartan 160 mg- Last filled 03-08-2021 90 DS [facility] Atorvastatin 10 mg- Last filled 06-20-2021 90 DS [facility]  [place] Pharmacist Assistant [phone]

## 2021-08-23 ENCOUNTER — Other Ambulatory Visit: Payer: Self-pay | Admitting: Nurse Practitioner

## 2021-08-23 DIAGNOSIS — I10 Essential (primary) hypertension: Secondary | ICD-10-CM

## 2021-08-24 ENCOUNTER — Other Ambulatory Visit: Payer: Self-pay | Admitting: Nurse Practitioner

## 2021-08-24 DIAGNOSIS — E7800 Pure hypercholesterolemia, unspecified: Secondary | ICD-10-CM

## 2021-09-06 ENCOUNTER — Other Ambulatory Visit: Payer: Self-pay

## 2021-09-06 ENCOUNTER — Ambulatory Visit: Payer: Medicare HMO | Admitting: Nurse Practitioner

## 2021-09-06 DIAGNOSIS — E079 Disorder of thyroid, unspecified: Secondary | ICD-10-CM

## 2021-09-06 MED ORDER — CARVEDILOL 6.25 MG PO TABS: 6.2500 mg | ORAL_TABLET | Freq: Two times a day (BID) | ORAL | 0 refills | Status: DC

## 2021-09-06 MED ORDER — LEVOTHYROXINE SODIUM 25 MCG PO TABS
ORAL_TABLET | ORAL | 0 refills | Status: DC
Start: 2021-09-06 — End: 2021-09-21

## 2021-09-06 MED ORDER — VALSARTAN 160 MG PO TABS: 160.0000 mg | ORAL_TABLET | Freq: Every day | ORAL | 1 refills | Status: DC

## 2021-09-07 ENCOUNTER — Ambulatory Visit (INDEPENDENT_AMBULATORY_CARE_PROVIDER_SITE_OTHER): Payer: Medicare HMO

## 2021-09-07 DIAGNOSIS — E782 Mixed hyperlipidemia: Secondary | ICD-10-CM

## 2021-09-07 DIAGNOSIS — I10 Essential (primary) hypertension: Secondary | ICD-10-CM

## 2021-09-07 NOTE — Progress Notes (Signed)
Chronic Care Management Pharmacy Note  09/14/2021 Name:  Starnisha Batrez MRN:  [id number] DOB:  [birth year]  Summary: Patient  reports that she has been doing okay.   Recommendations/Changes made from today's visit: Recommend patient start checking her BP at home at least once or twice per week.  Plan: Ms. Harkless is going to purchase a BP cuff and start checking her BP at least twice per week.    Subjective: Latonia Conrow is an 80 y.o. year old female who is a primary patient of [name], [place].  The CCM team was consulted for assistance with disease management and care coordination needs.    Engaged with patient by telephone for follow up visit in response to provider referral for pharmacy case management and/or care coordination services. The patient reports that her daughter is helping her with out her consent. She does not want her daughter involved in her medical care. She would like to make sure all appointments go through her only as they have always been. She lives with her daughter and she pays rent to her. She reports that her daughter is doing things that making her uncomfortable. She reports that her daughter has caused some confusion and she does not like that. She reports that it is all about money. Patient reports that her daughter is financially in trouble. She is looking into getting her own place.   Consent to Services:  The patient was given information about Chronic Care Management services, agreed to services, and gave verbal consent prior to initiation of services.  Please see initial visit note for detailed documentation.   Patient Care Team: [name], FNP as PCP - General (General Practice)  Recent office visits: 04/05/2021 PCP OV  Recent consult visits: None noted in the past 6 months  Hospital visits: None in previous 6 months   Objective:  Lab Results  Component Value Date   CREATININE 0.93 09/01/2020   BUN 13 09/01/2020   GFR 73.10  06/21/2011   GFRNONAA 59 (L) 09/01/2020   GFRAA 68 09/01/2020   NA 141 09/01/2020   K 4.2 09/01/2020   CALCIUM 9.7 09/01/2020   CO2 25 09/01/2020   GLUCOSE 81 09/01/2020    Lab Results  Component Value Date/Time   GFR 73.10 06/21/2011 10:48 AM   MICROALBUR 150 09/01/2020 05:23 PM   MICROALBUR 10 06/19/2019 09:09 AM    Last diabetic Eye exam: No results found for: HMDIABEYEEXA  Last diabetic Foot exam: No results found for: HMDIABFOOTEX   Lab Results  Component Value Date   CHOL 163 09/01/2020   HDL 55 09/01/2020   LDLCALC 93 09/01/2020   TRIG 82 09/01/2020   CHOLHDL 3.0 09/01/2020    Hepatic Function Latest Ref Rng & Units 09/01/2020 03/30/2020 12/23/2019  Total Protein 6.0 - 8.5 g/dL 7.1 7.1 6.6  Albumin 3.7 - 4.7 g/dL 4.2 4.3 4.0  AST 0 - 40 IU/L '22 23 22  ' ALT 0 - 32 IU/L '15 16 13  ' Alk Phosphatase 44 - 121 IU/L 79 90 83  Total Bilirubin 0.0 - 1.2 mg/dL 0.5 0.6 0.7  Bilirubin, Direct 0.0 - 0.3 mg/dL - - -    Lab Results  Component Value Date/Time   TSH 3.740 03/30/2020 12:12 PM   TSH 3.450 12/23/2019 10:36 AM   FREET4 1.17 03/19/2019 10:26 AM    CBC Latest Ref Rng & Units 09/01/2020 03/19/2019 03/27/2018  WBC 3.4 - 10.8 x10E3/uL 5.8 5.6 5.0  Hemoglobin 11.1 - 15.9  g/dL 13.2 13.4 12.9  Hematocrit 34.0 - 46.6 % 39.9 40.2 41.3  Platelets 150 - 450 x10E3/uL 235 222 228    Lab Results  Component Value Date/Time   VD25OH 63.4 09/01/2020 04:11 PM   VD25OH 33.9 09/23/2019 10:35 AM    Clinical ASCVD: No  The ASCVD Risk score (Arnett DK, et al., 2019) failed to calculate for the following reasons:   The systolic blood pressure is missing    Depression screen [facility] 2/9 03/10/2021 01/22/2020 12/23/2019  Decreased Interest 0 0 0  Down, Depressed, Hopeless 0 0 0  PHQ - 2 Score 0 0 0  Altered sleeping - 0 -  Tired, decreased energy - 0 -  Change in appetite - 0 -  Feeling bad or failure about yourself  - 0 -  Trouble concentrating - 0 -  Moving slowly or fidgety/restless -  0 -  Suicidal thoughts - 0 -  PHQ-9 Score - 0 -  Difficult doing work/chores - Not difficult at all -     Social History   Tobacco Use  Smoking Status Former   Types: Cigarettes   Quit date: 09/18/1986   Years since quitting: 35.0  Smokeless Tobacco Never   BP Readings from Last 3 Encounters:  09/01/20 128/86  03/30/20 138/86  01/22/20 (!) 150/90   Pulse Readings from Last 3 Encounters:  09/01/20 83  03/30/20 79  01/22/20 (!) 118   Wt Readings from Last 3 Encounters:  03/10/21 146 lb (66.2 kg)  09/01/20 176 lb 12.8 oz (80.2 kg)  03/30/20 176 lb (79.8 kg)   BMI Readings from Last 3 Encounters:  03/10/21 23.57 kg/m  09/01/20 30.73 kg/m  03/30/20 30.59 kg/m    Assessment/Interventions: Review of patient past medical history, allergies, medications, health status, including review of consultants reports, laboratory and other test data, was performed as part of comprehensive evaluation and provision of chronic care management services.   SDOH:  (Social Determinants of Health) assessments and interventions performed: No  SDOH Screenings   Alcohol Screen: Not on file  Depression (PHQ2-9): Low Risk    PHQ-2 Score: 0  Financial Resource Strain: Low Risk    Difficulty of Paying Living Expenses: Not hard at all  Food Insecurity: No Food Insecurity   Worried About Charity fundraiser in the Last Year: Never true   Ran Out of Food in the Last Year: Never true  Housing: Not on file  Physical Activity: Insufficiently Active   Days of Exercise per Week: 3 days   Minutes of Exercise per Session: 30 min  Social Connections: Not on file  Stress: No Stress Concern Present   Feeling of Stress : Not at all  Tobacco Use: Medium Risk   Smoking Tobacco Use: Former   Smokeless Tobacco Use: Never   Passive Exposure: Not on file  Transportation Needs: No Transportation Needs   Lack of Transportation (Medical): No   Lack of Transportation (Non-Medical): No    CCM Care  Plan  Allergies  Allergen Reactions   Shellfish Allergy Itching   Penicillins     REACTION: unsure of reaction---years ago    Medications Reviewed Today     Reviewed by Mayford Knife, [facility] (Pharmacist) on 09/07/21 at [place]  Med List Status: <None>   Medication Order Taking? Sig Documenting Provider Last Dose Status Informant  aspirin EC 81 MG tablet [id number] Yes Take 81 mg by mouth daily. Swallow whole. [provider] Taking Active Self  atorvastatin (LIPITOR) 10  MG tablet [id number] Yes TAKE 1 TABLET BY MOUTH EVERY DAY [name], FNP Taking Active   carvedilol (COREG) 6.25 MG tablet [id number] Yes Take 1 tablet (6.25 mg total) by mouth 2 (two) times daily. PATIENT MUST SCHEDULE APPOINTMENT FOR FUTURE REFILLS. [name], FNP Taking Active   COVID-19 mRNA bivalent vaccine, Pfizer, injection [id number]  Inject into the muscle. Carlyle Basques, MD  Active   fluticasone [facility]) 50 MCG/ACT nasal spray [id number] Yes SHAKE LIQUID AND USE 2 SPRAYS IN EACH NOSTRIL DAILY [name], FNP Taking Active   hydrochlorothiazide (HYDRODIURIL) 25 MG tablet [id number] Yes TAKE 1 TABLET BY MOUTH EVERY DAY [name], FNP Taking Active   levothyroxine (SYNTHROID) 25 MCG tablet [id number] Yes TAKE 1 TABLET(25 MCG) BY MOUTH DAILY [name], FNP Taking Active   valsartan (DIOVAN) 160 MG tablet [id number] Yes Take 1 tablet (160 mg total) by mouth daily. [name], FNP Taking Active   valsartan (DIOVAN) 160 MG tablet [id number]  Take 1 tablet (160 mg total) by mouth daily. [name], FNP  Active             Patient Active Problem List   Diagnosis Date Noted   Mixed hyperlipidemia 12/16/2018   Hypothyroidism 12/16/2018   VENOUS INSUFFICIENCY 06/12/2009   Vitamin D deficiency 06/09/2009   DIVERTICULOSIS OF COLON 06/07/2008   COLONIC POLYPS 06/03/2008   HYPERCHOLESTEROLEMIA 06/03/2008   OVERWEIGHT 06/03/2008   ANXIETY 06/03/2008   Essential hypertension 06/03/2008    MITRAL VALVE PROLAPSE 06/03/2008   ALLERGIC RHINITIS 06/03/2008    Immunization History  Administered Date(s) Administered   Fluad Quad(high Dose 65+) 09/01/2020   Influenza Whole 06/03/2008, 07/08/2009   Influenza-Unspecified 07/18/2011, 06/18/2018, 06/16/2019   Moderna Sars-Covid-2 Vaccination 10/14/2019, 11/11/2019   PFIZER(Purple Top)SARS-COV-2 Vaccination 08/02/2020, 01/04/2021   Pfizer Covid-19 Vaccine Bivalent Booster 53yr & up 06/17/2021   Pneumococcal Polysaccharide-23 06/09/2009   Pneumococcal-Unspecified 06/21/2019   Tdap 05/06/2013   Zoster Recombinat (Shingrix) 06/21/2019, 08/29/2019    Conditions to be addressed/monitored:  Hypertension and Hyperlipidemia  Care Plan : [place] Updates made by PMayford Knife RNorwoodsince 09/14/2021 12:00 AM     Problem: HTN, HLD      Long-Range Goal: Diseae Management   This Visit's Progress: On track  Recent Progress: On track  Priority: High  Note:     Current Barriers:  Unable to independently monitor therapeutic efficacy   Pharmacist Clinical Goal(s):  Patient will achieve adherence to monitoring guidelines and medication adherence to achieve therapeutic efficacy through collaboration with PharmD and provider.   Interventions: 1:1 collaboration with MMinette Brine FNP regarding development and update of comprehensive plan of care as evidenced by provider attestation and co-signature Inter-disciplinary care team collaboration (see longitudinal plan of care) Comprehensive medication review performed; medication list updated in electronic medical record  Hypertension (BP goal <140/90) -Not ideally controlled -Current treatment: Carvedilol 6.25 mg tablet by mouth twice daily Hydrochlorothiazide 270mtablet daily  Valsartan 160 mg tablet by mouth daily  Taking 1/2 tablet by mouth daily   -Current home readings: she is not checking her BP at home.  -Ms. JoTriggs going to purchase BP cuff and begin  writing down and logging her BP readings.  -Current dietary habits: she is not cooking with salt, and she tries to have mostly unsalted food.  -Current exercise habits: She is is walking at the mall three times per day on Monday, Wednesday and Friday for 30 minutes.  -Denies hypotensive/hypertensive symptoms -Educated on Importance  of home blood pressure monitoring; Proper BP monitoring technique; -Counseled to monitor BP at home at least twice per week, document, and provide log at future appointments -Discussed in great detail the importance of taking medication as written on the directions.  -Recommended to continue current medication Collaborated with patient to discuss purchasing a BP cuff from the pharmacy.   Hyperlipidemia: (LDL goal < 70) -Controlled -Current treatment: Atorvastatin 10 mg tablet once per day  -Current dietary patterns: she is eating as close as she can for a healthy diet.  -Current exercise habits: please see hypertension. -Educated on Importance of limiting foods high in cholesterol; Exercise goal of 150 minutes per week; -Recommended to continue current medication  Health Maintenance -Vaccine gaps: Pneumonia Vaccine, Influenza  -Educated on the importance of keeping scheduled PCP appointments.  -Discussed the need for a DEXA screen, patient wuold like   -Patient reports that she is not sure why she missed her appointment in May but she is certain that she will make her appointment in the beginning of January.  -Collaborated with CCM team and recommended patient discuss concerns with housing issues with CCM Education officer, museum.    Patient Goals/Self-Care Activities Patient will:  - take medications as prescribed as evidenced by patient report and record review focus on medication adherence by taking her medication at the same time each day after you eat in the morning.   Follow Up Plan: The patient has been provided with contact information for the care  management team and has been advised to call with any health related questions or concerns.  Follow up with provider re: 09/21/2021      Medication Assistance: None required.  Patient affirms current coverage meets needs.  Compliance/Adherence/Medication fill history: Care Gaps: DEXA Scan  Pneumonia Vaccine Influenza Vaccine  Star-Rating Drugs: Atorvastatin 10 mg tablet Valsartan 160 mg tablet   Patient's preferred pharmacy is:  [facility] [phone] - [address] [place] [address] Phone: [phone] Fax: [phone]  [place], [address] [place] [address] Phone: [phone] Fax: [phone]  Uses pill box? Yes Pt endorses 90% compliance  We discussed: Benefits of medication synchronization, packaging and delivery as well as enhanced pharmacist oversight with [employer]. Patient decided to: Continue current medication management strategy  Care Plan and Follow Up Patient Decision:  Patient agrees to Care Plan and Follow-up.  Plan: The patient has been provided with contact information for the care management team and has been advised to call with any health related questions or concerns.   [name], CPP, PharmD Clinical Pharmacist Practitioner [facility] [phone]

## 2021-09-14 NOTE — Patient Instructions (Signed)
Visit Information It was great speaking with you today!  Please let me know if you have any questions about our visit.   Goals Addressed             This Visit's Progress    Manage My Medicine       Timeframe:  Long-Range Goal Priority:  High Start Date:      03/23/2021                       Expected End Date:                       Follow Up Date 03/10/2022   In Progress: - call for medicine refill 2 or 3 days before it runs out - call if I am sick and can't take my medicine - keep a list of all the medicines I take; vitamins and herbals too - use a pillbox to sort medicine - use an alarm clock or phone to remind me to take my medicine    Why is this important?   These steps will help you keep on track with your medicines.   Notes:  Please call if you have any questions.         Patient Care Plan: CCM Pharmacy Care Plan     Problem Identified: HTN, HLD      Long-Range Goal: Diseae Management   This Visit's Progress: On track  Recent Progress: On track  Priority: High  Note:     Current Barriers:  Unable to independently monitor therapeutic efficacy   Pharmacist Clinical Goal(s):  Patient will achieve adherence to monitoring guidelines and medication adherence to achieve therapeutic efficacy through collaboration with PharmD and provider.   Interventions: 1:1 collaboration with [name], FNP regarding development and update of comprehensive plan of care as evidenced by provider attestation and co-signature Inter-disciplinary care team collaboration (see longitudinal plan of care) Comprehensive medication review performed; medication list updated in electronic medical record  Hypertension (BP goal <140/90) -Not ideally controlled -Current treatment: Carvedilol 6.25 mg tablet by mouth twice daily Hydrochlorothiazide 25mg  tablet daily  Valsartan 160 mg tablet by mouth daily  Taking 1/2 tablet by mouth daily   -Current home readings: she is not  checking her BP at home.  -Ms. Newsom is going to purchase BP cuff and begin writing down and logging her BP readings.  -Current dietary habits: she is not cooking with salt, and she tries to have mostly unsalted food.  -Current exercise habits: She is is walking at the mall three times per day on Monday, Wednesday and Friday for 30 minutes.  -Denies hypotensive/hypertensive symptoms -Educated on Importance of home blood pressure monitoring; Proper BP monitoring technique; -Counseled to monitor BP at home at least twice per week, document, and provide log at future appointments -Discussed in great detail the importance of taking medication as written on the directions.  -Recommended to continue current medication Collaborated with patient to discuss purchasing a BP cuff from the pharmacy.   Hyperlipidemia: (LDL goal < 70) -Controlled -Current treatment: Atorvastatin 10 mg tablet once per day  -Current dietary patterns: she is eating as close as she can for a healthy diet.  -Current exercise habits: please see hypertension. -Educated on Importance of limiting foods high in cholesterol; Exercise goal of 150 minutes per week; -Recommended to continue current medication  Health Maintenance -Vaccine gaps: Pneumonia Vaccine, Influenza  -Educated on the importance of keeping scheduled PCP  appointments.  -Discussed the need for a DEXA screen, patient wuold like   -Patient reports that she is not sure why she missed her appointment in May but she is certain that she will make her appointment in the beginning of January.  -Collaborated with CCM team and recommended patient discuss concerns with housing issues with CCM Education officer, museum.    Patient Goals/Self-Care Activities Patient will:  - take medications as prescribed as evidenced by patient report and record review focus on medication adherence by taking her medication at the same time each day after you eat in the morning.   Follow Up Plan:  The patient has been provided with contact information for the care management team and has been advised to call with any health related questions or concerns.  Follow up with provider re: 09/21/2021      Patient agreed to services and verbal consent obtained.   The patient verbalized understanding of instructions, educational materials, and care plan provided today and agreed to receive a mailed copy of patient instructions, educational materials, and care plan.   [name], PharmD Clinical Pharmacist [facility] [phone]

## 2021-09-17 DIAGNOSIS — E782 Mixed hyperlipidemia: Secondary | ICD-10-CM | POA: Diagnosis not present

## 2021-09-17 DIAGNOSIS — I10 Essential (primary) hypertension: Secondary | ICD-10-CM

## 2021-09-21 ENCOUNTER — Other Ambulatory Visit: Payer: Self-pay

## 2021-09-21 ENCOUNTER — Encounter: Payer: Self-pay | Admitting: Nurse Practitioner

## 2021-09-21 ENCOUNTER — Ambulatory Visit (INDEPENDENT_AMBULATORY_CARE_PROVIDER_SITE_OTHER): Payer: Medicare HMO | Admitting: Nurse Practitioner

## 2021-09-21 VITALS — BP 132/90 | HR 105 | Temp 99.3°F | Ht 66.0 in | Wt 172.2 lb

## 2021-09-21 DIAGNOSIS — I10 Essential (primary) hypertension: Secondary | ICD-10-CM

## 2021-09-21 DIAGNOSIS — E782 Mixed hyperlipidemia: Secondary | ICD-10-CM

## 2021-09-21 DIAGNOSIS — Z6827 Body mass index (BMI) 27.0-27.9, adult: Secondary | ICD-10-CM

## 2021-09-21 DIAGNOSIS — E039 Hypothyroidism, unspecified: Secondary | ICD-10-CM | POA: Diagnosis not present

## 2021-09-21 DIAGNOSIS — E7800 Pure hypercholesterolemia, unspecified: Secondary | ICD-10-CM

## 2021-09-21 DIAGNOSIS — E079 Disorder of thyroid, unspecified: Secondary | ICD-10-CM

## 2021-09-21 LAB — POCT URINALYSIS DIPSTICK
Bilirubin, UA: NEGATIVE
Glucose, UA: NEGATIVE
Ketones, UA: NEGATIVE
Leukocytes, UA: NEGATIVE
Nitrite, UA: NEGATIVE
Protein, UA: POSITIVE — AB
Spec Grav, UA: 1.02 (ref 1.010–1.025)
Urobilinogen, UA: 1 E.U./dL
pH, UA: 7 (ref 5.0–8.0)

## 2021-09-21 MED ORDER — HYDROCHLOROTHIAZIDE 25 MG PO TABS: 25.0000 mg | ORAL_TABLET | Freq: Every day | ORAL | 1 refills | Status: DC

## 2021-09-21 MED ORDER — ATORVASTATIN CALCIUM 10 MG PO TABS: 10.0000 mg | ORAL_TABLET | Freq: Every day | ORAL | 1 refills | Status: DC

## 2021-09-21 MED ORDER — FLUTICASONE PROPIONATE 50 MCG/ACT NA SUSP: NASAL | 2 refills | Status: DC

## 2021-09-21 MED ORDER — VALSARTAN 160 MG PO TABS: 160.0000 mg | ORAL_TABLET | Freq: Every day | ORAL | 1 refills | Status: DC

## 2021-09-21 MED ORDER — LEVOTHYROXINE SODIUM 25 MCG PO TABS: ORAL_TABLET | ORAL | 1 refills | Status: DC

## 2021-09-21 MED ORDER — CARVEDILOL 6.25 MG PO TABS: 6.2500 mg | ORAL_TABLET | Freq: Two times a day (BID) | ORAL | 1 refills | Status: DC

## 2021-09-21 NOTE — Progress Notes (Signed)
I,Katawbba Wiggins,acting as a Education administrator for [employer], NP.,have documented all relevant documentation on the behalf of [employer], NP,as directed by  Bary Castilla, NP while in the presence of Bary Castilla, NP.  This visit occurred during the SARS-CoV-2 public health emergency.  Safety protocols were in place, including screening questions prior to the visit, additional usage of staff PPE, and extensive cleaning of exam room while observing appropriate contact time as indicated for disinfecting solutions.  Subjective:     Patient ID: Janet Barber , female    DOB: [birth year] , 81 y.o.   MRN: [id number]   Chief Complaint  Patient presents with   Hypertension   Hyperlipidemia   Hypothyroidism    HPI  The patient is here today for a blood pressure, cholesterol follow-up, and to check to see if the patient has a UTI because she has been having some confusion.  Diet: she is eating healthy avoiding salt and processed foods  Exercise: she walks MWF  She has not taken her BP med today because she has not ate.   Hypertension This is a chronic problem. The current episode started more than 1 year ago. The problem has been gradually improving since onset. The problem is controlled. Pertinent negatives include no anxiety, chest pain, headaches or palpitations. There are no associated agents to hypertension. Risk factors for coronary artery disease include dyslipidemia, obesity and sedentary lifestyle.  Hyperlipidemia Pertinent negatives include no chest pain.    Past Medical History:  Diagnosis Date   Allergic rhinitis    Anxiety    Colon cancer (HCC)    Diverticulosis of colon    History of hemorrhoids    Hx of colonic polyps    Hypercholesterolemia    Hypertension    Hypothyroidism    Lumbar back pain    Mitral valve prolapse    Overweight(278.02)    Venous insufficiency    Vitamin D deficiency      Family History  Problem Relation Age of Onset    Hypertension Maternal Grandmother      Current Outpatient Medications:    aspirin EC 81 MG tablet, Take 81 mg by mouth daily. Swallow whole., Disp: , Rfl:    atorvastatin (LIPITOR) 10 MG tablet, Take 1 tablet (10 mg total) by mouth daily., Disp: 90 tablet, Rfl: 1   carvedilol (COREG) 6.25 MG tablet, Take 1 tablet (6.25 mg total) by mouth 2 (two) times daily. PATIENT MUST SCHEDULE APPOINTMENT FOR FUTURE REFILLS., Disp: 180 tablet, Rfl: 1   COVID-19 mRNA bivalent vaccine, Pfizer, injection, Inject into the muscle., Disp: 0.3 mL, Rfl: 0   fluticasone (FLONASE) 50 MCG/ACT nasal spray, SHAKE LIQUID AND USE 2 SPRAYS IN EACH NOSTRIL DAILY, Disp: 48 g, Rfl: 2   hydrochlorothiazide (HYDRODIURIL) 25 MG tablet, Take 1 tablet (25 mg total) by mouth daily., Disp: 90 tablet, Rfl: 1   levothyroxine (SYNTHROID) 25 MCG tablet, TAKE 1 TABLET(25 MCG) BY MOUTH DAILY, Disp: 90 tablet, Rfl: 1   valsartan (DIOVAN) 160 MG tablet, Take 1 tablet (160 mg total) by mouth daily., Disp: 90 tablet, Rfl: 1   Allergies  Allergen Reactions   Shellfish Allergy Itching   Penicillins     REACTION: unsure of reaction---years ago     Review of Systems  Constitutional:  Negative for chills, fatigue and fever.  HENT:  Negative for congestion.   Respiratory:  Negative for cough and wheezing.   Cardiovascular:  Negative for chest pain and palpitations.  Neurological:  Negative for  weakness and headaches.    Today's Vitals   09/21/21 0923  BP: (!) 156/98  Pulse: (!) 105  Temp: 99.3 F (37.4 C)  Weight: 172 lb 3.2 oz (78.1 kg)  Height: '5\' 6"'  (1.676 m)  PainSc: 0-No pain   Body mass index is 27.79 kg/m.  Wt Readings from Last 3 Encounters:  09/21/21 172 lb 3.2 oz (78.1 kg)  03/10/21 146 lb (66.2 kg)  09/01/20 176 lb 12.8 oz (80.2 kg)    BP Readings from Last 3 Encounters:  09/21/21 (!) 156/98  09/01/20 128/86  03/30/20 138/86    Objective:  Physical Exam Constitutional:      Appearance: Normal appearance.   HENT:     Head: Normocephalic and atraumatic.  Cardiovascular:     Rate and Rhythm: Normal rate and regular rhythm.     Pulses: Normal pulses.     Heart sounds: Normal heart sounds. No murmur heard. Pulmonary:     Effort: Pulmonary effort is normal. No respiratory distress.     Breath sounds: Normal breath sounds.  Skin:    General: Skin is warm and dry.     Capillary Refill: Capillary refill takes less than 2 seconds.  Neurological:     Mental Status: She is alert.       Assessment And Plan:     1. Essential hypertension -Chronic, BP was 132/90 -The patient has not taken her BP med today  -Advised patient to take BP at home and keep log to bring in or send msg in mychart.  -Limit the intake of processed foods and salt intake. You should increase your intake of green vegetables and fruits. Limit the use of alcohol. Limit fast foods and fried foods. Avoid high fatty saturated and trans fat foods. Keep yourself hydrated with drinking water. Avoid red meats. Eat lean meats instead. Exercise for atleast 30-45 min for atleast 4-5 times a week.  - POCT Urinalysis Dipstick (81002) - CMP14+EGFR - CBC no Diff  2. Mixed hyperlipidemia -Chronic, stable,  -Will check labs today  -Continue meds  -Avoid fatty,fried and fast foods; Increase physical activity  - Lipid panel  3. Hypothyroidism, unspecified type -Chronic,  -will check thyroid levels today  - TSH + free T4  4. BMI 27.0-27.9,adult Advised patient on a healthy diet including avoiding fast food and red meats. Increase the intake of lean meats including grilled chicken and [place].  Drink a lot of water. Decrease intake of fatty foods. Exercise for 30-45 min. 4-5 a week to decrease the risk of cardiac event.   The patient was encouraged to call or send a message through [employer] for any questions or concerns.   Follow up: if symptoms persist or do not get better.   Side effects and appropriate use of all the medication(s)  were discussed with the patient today. Patient advised to use the medication(s) as directed by their healthcare provider. The patient was encouraged to read, review, and understand all associated package inserts and contact our office with any questions or concerns. The patient accepts the risks of the treatment plan and had an opportunity to ask questions.   Patient was given opportunity to ask questions. Patient verbalized understanding of the plan and was able to repeat key elements of the plan. All questions were answered to their satisfaction.  Raman Maalik Pinn, DNP   I, Raman Dema Timmons have reviewed all documentation for this visit. The documentation on 01/27/21 for the exam, diagnosis, procedures, and orders are all accurate and  complete.   IF YOU HAVE BEEN REFERRED TO A SPECIALIST, IT MAY TAKE 1-2 WEEKS TO SCHEDULE/PROCESS THE REFERRAL. IF YOU HAVE NOT HEARD FROM US/SPECIALIST IN TWO WEEKS, PLEASE GIVE [place] A CALL AT [phone] X [phone].   THE PATIENT IS ENCOURAGED TO PRACTICE SOCIAL DISTANCING DUE TO THE COVID-19 PANDEMIC.

## 2021-09-21 NOTE — Patient Instructions (Signed)

## 2021-09-22 LAB — CMP14+EGFR
ALT: 15 IU/L (ref 0–32)
AST: 23 IU/L (ref 0–40)
Albumin/Globulin Ratio: 1.4 (ref 1.2–2.2)
Albumin: 3.8 g/dL (ref 3.7–4.7)
Alkaline Phosphatase: 79 IU/L (ref 44–121)
BUN/Creatinine Ratio: 15 (ref 12–28)
BUN: 14 mg/dL (ref 8–27)
Bilirubin Total: 0.8 mg/dL (ref 0.0–1.2)
CO2: 26 mmol/L (ref 20–29)
Calcium: 9.6 mg/dL (ref 8.7–10.3)
Chloride: 106 mmol/L (ref 96–106)
Creatinine, Ser: 0.92 mg/dL (ref 0.57–1.00)
Globulin, Total: 2.7 g/dL (ref 1.5–4.5)
Glucose: 108 mg/dL — ABNORMAL HIGH (ref 70–99)
Potassium: 3.4 mmol/L — ABNORMAL LOW (ref 3.5–5.2)
Sodium: 144 mmol/L (ref 134–144)
Total Protein: 6.5 g/dL (ref 6.0–8.5)
eGFR: 63 mL/min/{1.73_m2} (ref >59)

## 2021-09-22 LAB — CBC
Hematocrit: 37 % (ref 34.0–46.6)
Hemoglobin: 12.2 g/dL (ref 11.1–15.9)
MCH: 29.5 pg (ref 26.6–33.0)
MCHC: 33 g/dL (ref 31.5–35.7)
MCV: 89 fL (ref 79–97)
Platelets: 203 10*3/uL (ref 150–450)
RBC: 4.14 x10E6/uL (ref 3.77–5.28)
RDW: 11.8 % (ref 11.7–15.4)
WBC: 7.3 10*3/uL (ref 3.4–10.8)

## 2021-09-22 LAB — TSH+FREE T4
Free T4: 1.36 ng/dL (ref 0.82–1.77)
TSH: 4.93 u[IU]/mL — ABNORMAL HIGH (ref 0.450–4.500)

## 2021-09-22 LAB — LIPID PANEL
Chol/HDL Ratio: 3.5 ratio (ref 0.0–4.4)
Cholesterol, Total: 187 mg/dL (ref 100–199)
HDL: 53 mg/dL (ref >39)
LDL Chol Calc (NIH): 117 mg/dL — ABNORMAL HIGH (ref 0–99)
Triglycerides: 95 mg/dL (ref 0–149)
VLDL Cholesterol Cal: 17 mg/dL (ref 5–40)

## 2021-11-22 ENCOUNTER — Other Ambulatory Visit (HOSPITAL_BASED_OUTPATIENT_CLINIC_OR_DEPARTMENT_OTHER): Payer: Self-pay

## 2021-11-25 ENCOUNTER — Telehealth: Payer: Self-pay

## 2021-11-25 NOTE — Chronic Care Management (AMB) (Incomplete)
° ° °  Chronic Care Management Pharmacy Assistant   Name: Janet Barber  MRN: [id number] DOB: 02/09/1941  Reason for Encounter: Disease State/ General  Recent office visits:  09-21-2021 Bary Castilla, NP. Glucose= 108, Potassium= 3.4. LDL= 117. TSH= 4.930. Abnormal UA.  Recent consult visits:  None  Hospital visits:  None in previous 6 months  Medications: Outpatient Encounter Medications as of 11/25/2021  Medication Sig   aspirin EC 81 MG tablet Take 81 mg by mouth daily. Swallow whole.   atorvastatin (LIPITOR) 10 MG tablet Take 1 tablet (10 mg total) by mouth daily.   carvedilol (COREG) 6.25 MG tablet Take 1 tablet (6.25 mg total) by mouth 2 (two) times daily. PATIENT MUST SCHEDULE APPOINTMENT FOR FUTURE REFILLS.   COVID-19 mRNA bivalent vaccine, Pfizer, injection Inject into the muscle.   fluticasone (FLONASE) 50 MCG/ACT nasal spray SHAKE LIQUID AND USE 2 SPRAYS IN EACH NOSTRIL DAILY   hydrochlorothiazide (HYDRODIURIL) 25 MG tablet Take 1 tablet (25 mg total) by mouth daily.   levothyroxine (SYNTHROID) 25 MCG tablet TAKE 1 TABLET(25 MCG) BY MOUTH DAILY   valsartan (DIOVAN) 160 MG tablet Take 1 tablet (160 mg total) by mouth daily.   No facility-administered encounter medications on file as of 11/25/2021.  Contacted [name] for general disease state and medication adherence call.   Patient {is/isnt:25531} > 5 days past due for refill on the following medications per chart history:   What concerns do you have about your medications?  The patient {denies/reports:25180} side effects with her medications.   How often do you forget or accidentally miss a dose? {missed doses:25554}  Do you use a pillbox? {yes/no:20286}  Are you having any problems getting your medications from your pharmacy? {yes/no:20286}  Has the cost of your medications been a concern? {yes/no:20286} If yes, what medication and is patient assistance available or has it been applied  for?  Since last visit with [facility], {no/thefollowing:25210} interventions have been made:   The patient has not had an ED visit since last contact.   The patient {denies/reports:25180} problems with their health.   she {denies/reports:25180}  concerns or questions for [name], at this time.   Patient states BP and BG readings are as follows***  Fasting: *** Before meals: *** After meals: *** Bedtime: ***  Counseled patient on: ***only leave if you discussed with patient   [place] job taking medications! (If good adherence)***  Importance of taking medication daily without missed doses  Benefits of adherence packaging or a pillbox   Access to CCM team for any cost, medication, or pharmacy concerns  11-25-2021: 1st attempt left VM 11-28-2021: 2nd attempt left VM  Care Gaps:  Star Rating Drugs: Valsartan 160 mg- Last filled 09-21-2021 90 DS [facility] Atorvastatin 10 mg- Last filled 09-21-2021  90 DS [facility] Pharmacist Assistant [phone]

## 2021-12-28 ENCOUNTER — Encounter: Payer: Medicare (Managed Care) | Admitting: Nurse Practitioner

## 2022-02-28 ENCOUNTER — Telehealth: Payer: Self-pay

## 2022-02-28 NOTE — Progress Notes (Signed)
02-28-2022: Left patient a voicemail with canceled appointment with [name] on 03-10-2022 and rescheduled to 04-11-2022.  [facility] Pharmacist Assistant [phone]

## 2022-03-08 ENCOUNTER — Ambulatory Visit: Payer: Medicare HMO

## 2022-03-08 ENCOUNTER — Ambulatory Visit: Payer: Medicare HMO | Admitting: Nurse Practitioner

## 2022-03-10 ENCOUNTER — Telehealth: Payer: Medicare HMO

## 2022-03-10 ENCOUNTER — Telehealth: Payer: Self-pay

## 2022-03-10 NOTE — Telephone Encounter (Signed)
Pts Daughter sees Dr Drue Novel (MRN: [id number]). She asks if you would mind accepting her as a new patient.   CB number: Daughter Janet Barber [phone]

## 2022-03-10 NOTE — Telephone Encounter (Signed)
Please advise 

## 2022-03-15 NOTE — Telephone Encounter (Signed)
Pt's daughter called and stated that she will stay with other doctor

## 2022-03-15 NOTE — Telephone Encounter (Signed)
Thank you for this update 

## 2022-03-23 ENCOUNTER — Ambulatory Visit (INDEPENDENT_AMBULATORY_CARE_PROVIDER_SITE_OTHER): Payer: Medicare (Managed Care)

## 2022-03-23 VITALS — Ht 66.0 in | Wt 160.0 lb

## 2022-03-23 DIAGNOSIS — Z0000 Encounter for general adult medical examination without abnormal findings: Secondary | ICD-10-CM

## 2022-03-23 DIAGNOSIS — E2839 Other primary ovarian failure: Secondary | ICD-10-CM

## 2022-03-23 NOTE — Patient Instructions (Signed)
Janet Barber , Thank you for taking time to come for your Medicare Wellness Visit. I appreciate your ongoing commitment to your health goals. Please review the following plan we discussed and let me know if I can assist you in the future.   Screening recommendations/referrals: Colonoscopy: not required Mammogram: not required Bone Density: ordered today Recommended yearly ophthalmology/optometry visit for glaucoma screening and checkup Recommended yearly dental visit for hygiene and checkup  Vaccinations: Influenza vaccine: due 04/18/2022 Pneumococcal vaccine: completed 06/21/2019 Tdap vaccine: completed 05/06/2013, due 05/07/2023 Shingles vaccine: completed   Covid-19: 06/17/2021, 01/04/2021, 08/02/2020, 10/22/2019, 10/14/2019  Advanced directives: Advance directive discussed with you today.   Conditions/risks identified: none  Next appointment: Follow up in one year for your annual wellness visit    Preventive Care 65 Years and Older, Female Preventive care refers to lifestyle choices and visits with your health care provider that can promote health and wellness. What does preventive care include? A yearly physical exam. This is also called an annual well check. Dental exams once or twice a year. Routine eye exams. Ask your health care provider how often you should have your eyes checked. Personal lifestyle choices, including: Daily care of your teeth and gums. Regular physical activity. Eating a healthy diet. Avoiding tobacco and drug use. Limiting alcohol use. Practicing safe sex. Taking low-dose aspirin every day. Taking vitamin and mineral supplements as recommended by your health care provider. What happens during an annual well check? The services and screenings done by your health care provider during your annual well check will depend on your age, overall health, lifestyle risk factors, and family history of disease. Counseling  Your health care provider may ask you questions  about your: Alcohol use. Tobacco use. Drug use. Emotional well-being. Home and relationship well-being. Sexual activity. Eating habits. History of falls. Memory and ability to understand (cognition). Work and work Statistician. Reproductive health. Screening  You may have the following tests or measurements: Height, weight, and BMI. Blood pressure. Lipid and cholesterol levels. These may be checked every 5 years, or more frequently if you are over 36 years old. Skin check. Lung cancer screening. You may have this screening every year starting at age 67 if you have a 30-pack-year history of smoking and currently smoke or have quit within the past 15 years. Fecal occult blood test (FOBT) of the stool. You may have this test every year starting at age 2. Flexible sigmoidoscopy or colonoscopy. You may have a sigmoidoscopy every 5 years or a colonoscopy every 10 years starting at age 89. Hepatitis C blood test. Hepatitis B blood test. Sexually transmitted disease (STD) testing. Diabetes screening. This is done by checking your blood sugar (glucose) after you have not eaten for a while (fasting). You may have this done every 1-3 years. Bone density scan. This is done to screen for osteoporosis. You may have this done starting at age 49. Mammogram. This may be done every 1-2 years. Talk to your health care provider about how often you should have regular mammograms. Talk with your health care provider about your test results, treatment options, and if necessary, the need for more tests. Vaccines  Your health care provider may recommend certain vaccines, such as: Influenza vaccine. This is recommended every year. Tetanus, diphtheria, and acellular pertussis (Tdap, Td) vaccine. You may need a Td booster every 10 years. Zoster vaccine. You may need this after age 36. Pneumococcal 13-valent conjugate (PCV13) vaccine. One dose is recommended after age 45. Pneumococcal polysaccharide (PPSV23)  vaccine. One dose is recommended after age 25. Talk to your health care provider about which screenings and vaccines you need and how often you need them. This information is not intended to replace advice given to you by your health care provider. Make sure you discuss any questions you have with your health care provider. Document Released: 10/01/2015 Document Revised: 05/24/2016 Document Reviewed: 07/06/2015 Elsevier Interactive Patient Education  2017 [employer] Prevention in the Home Falls can cause injuries. They can happen to people of all ages. There are many things you can do to make your home safe and to help prevent falls. What can I do on the outside of my home? Regularly fix the edges of walkways and driveways and fix any cracks. Remove anything that might make you trip as you walk through a door, such as a raised step or threshold. Trim any bushes or trees on the path to your home. Use bright outdoor lighting. Clear any walking paths of anything that might make someone trip, such as rocks or tools. Regularly check to see if handrails are loose or broken. Make sure that both sides of any steps have handrails. Any raised decks and porches should have guardrails on the edges. Have any leaves, snow, or ice cleared regularly. Use sand or salt on walking paths during winter. Clean up any spills in your garage right away. This includes oil or grease spills. What can I do in the bathroom? Use night lights. Install grab bars by the toilet and in the tub and shower. Do not use towel bars as grab bars. Use non-skid mats or decals in the tub or shower. If you need to sit down in the shower, use a plastic, non-slip stool. Keep the floor dry. Clean up any water that spills on the floor as soon as it happens. Remove soap buildup in the tub or shower regularly. Attach bath mats securely with double-sided non-slip rug tape. Do not have throw rugs and other things on the floor that  can make you trip. What can I do in the bedroom? Use night lights. Make sure that you have a light by your bed that is easy to reach. Do not use any sheets or blankets that are too big for your bed. They should not hang down onto the floor. Have a firm chair that has side arms. You can use this for support while you get dressed. Do not have throw rugs and other things on the floor that can make you trip. What can I do in the kitchen? Clean up any spills right away. Avoid walking on wet floors. Keep items that you use a lot in easy-to-reach places. If you need to reach something above you, use a strong step stool that has a grab bar. Keep electrical cords out of the way. Do not use floor polish or wax that makes floors slippery. If you must use wax, use non-skid floor wax. Do not have throw rugs and other things on the floor that can make you trip. What can I do with my stairs? Do not leave any items on the stairs. Make sure that there are handrails on both sides of the stairs and use them. Fix handrails that are broken or loose. Make sure that handrails are as long as the stairways. Check any carpeting to make sure that it is firmly attached to the stairs. Fix any carpet that is loose or worn. Avoid having throw rugs at the top or bottom of  the stairs. If you do have throw rugs, attach them to the floor with carpet tape. Make sure that you have a light switch at the top of the stairs and the bottom of the stairs. If you do not have them, ask someone to add them for you. What else can I do to help prevent falls? Wear shoes that: Do not have high heels. Have rubber bottoms. Are comfortable and fit you well. Are closed at the toe. Do not wear sandals. If you use a stepladder: Make sure that it is fully opened. Do not climb a closed stepladder. Make sure that both sides of the stepladder are locked into place. Ask someone to hold it for you, if possible. Clearly mark and make sure that you  can see: Any grab bars or handrails. First and last steps. Where the edge of each step is. Use tools that help you move around (mobility aids) if they are needed. These include: Canes. Walkers. Scooters. Crutches. Turn on the lights when you go into a dark area. Replace any light bulbs as soon as they burn out. Set up your furniture so you have a clear path. Avoid moving your furniture around. If any of your floors are uneven, fix them. If there are any pets around you, be aware of where they are. Review your medicines with your doctor. Some medicines can make you feel dizzy. This can increase your chance of falling. Ask your doctor what other things that you can do to help prevent falls. This information is not intended to replace advice given to you by your health care provider. Make sure you discuss any questions you have with your health care provider. Document Released: 07/01/2009 Document Revised: 02/10/2016 Document Reviewed: 10/09/2014 Elsevier Interactive Patient Education  2017 [employer].

## 2022-03-23 NOTE — Progress Notes (Signed)
I connected with Janet Barber today by telephone and verified that I am speaking with the correct person using two identifiers. Location patient: home Location provider: work Persons participating in the virtual visit: Janet Jansky LPN.   I discussed the limitations, risks, security and privacy concerns of performing an evaluation and management service by telephone and the availability of in person appointments. I also discussed with the patient that there may be a patient responsible charge related to this service. The patient expressed understanding and verbally consented to this telephonic visit.    Interactive audio and video telecommunications were attempted between this provider and patient, however failed, due to patient having technical difficulties OR patient did not have access to video capability.  We continued and completed visit with audio only.     Vital signs may be patient reported or missing.  Subjective:   Janet Barber is a 81 y.o. female who presents for Medicare Annual (Subsequent) preventive examination.  Review of Systems     Cardiac Risk Factors include: advanced age (>2mn, >>49women);hypertension     Objective:    Today's Vitals   03/23/22 1500  Weight: 160 lb (72.6 kg)  Height: '5\' 6"'$  (1.676 m)   Body mass index is 25.82 kg/m.     03/23/2022    3:05 PM 03/10/2021    3:32 PM 01/22/2020    2:41 PM 06/19/2019    8:51 AM 08/13/2018   12:01 PM 04/02/2018    6:34 AM 03/26/2018    2:37 PM  Advanced Directives  Does Patient Have a Medical Advance Directive? No No No No No No No  Would patient like information on creating a medical advance directive?   No - Patient declined Yes (MAU/Ambulatory/Procedural Areas - Information given) No - Patient declined No - Patient declined     Current Medications (verified) Outpatient Encounter Medications as of 03/23/2022  Medication Sig   aspirin EC 81 MG tablet Take 81 mg by mouth daily. Swallow whole.    atorvastatin (LIPITOR) 10 MG tablet Take 1 tablet (10 mg total) by mouth daily.   carvedilol (COREG) 6.25 MG tablet Take 1 tablet (6.25 mg total) by mouth 2 (two) times daily. PATIENT MUST SCHEDULE APPOINTMENT FOR FUTURE REFILLS.   COVID-19 mRNA bivalent vaccine, Pfizer, injection Inject into the muscle.   fluticasone (FLONASE) 50 MCG/ACT nasal spray SHAKE LIQUID AND USE 2 SPRAYS IN EACH NOSTRIL DAILY   hydrochlorothiazide (HYDRODIURIL) 25 MG tablet Take 1 tablet (25 mg total) by mouth daily.   levothyroxine (SYNTHROID) 25 MCG tablet TAKE 1 TABLET(25 MCG) BY MOUTH DAILY   valsartan (DIOVAN) 160 MG tablet Take 1 tablet (160 mg total) by mouth daily.   No facility-administered encounter medications on file as of 03/23/2022.    Allergies (verified) Shellfish allergy and Penicillins   History: Past Medical History:  Diagnosis Date   Allergic rhinitis    Anxiety    [place]on cancer ([place]    Diverticulosis of colon    History of hemorrhoids    Hx of colonic polyps    Hypercholesterolemia    Hypertension    Hypothyroidism    Lumbar back pain    Mitral valve prolapse    Overweight(278.02)    Venous insufficiency    Vitamin D deficiency    Past Surgical History:  Procedure Laterality Date   ABDOMINAL HYSTERECTOMY  1974   benign breast biopsy  2006   BREAST REDUCTION SURGERY Bilateral 04/02/2018   Procedure: MAMMARY REDUCTION  (BREAST);  Surgeon: Irene Limbo, MD;  Location: [facility];  Service: Plastics;  Laterality: Bilateral;   MASS EXCISION Left 04/02/2018   Procedure: EXCISION LEFT BREAST MASS;  Surgeon: Irene Limbo, MD;  Location: [facility];  Service: Plastics;  Laterality: Left;   UMBILICAL HERNIA REPAIR  1988   Family History  Problem Relation Age of Onset   Hypertension Maternal Grandmother    Social History   Socioeconomic History   Marital status: Widowed    Spouse name: Not on file   Number of children: 2   Years of education:  Not on file   Highest education level: Not on file  Occupational History   Occupation: retired  Tobacco Use   Smoking status: Former    Types: Cigarettes    Quit date: 09/18/1986    Years since quitting: 35.5   Smokeless tobacco: Never  Vaping Use   Vaping Use: Never used  Substance and Sexual Activity   Alcohol use: No   Drug use: No   Sexual activity: Not Currently  Other Topics Concern   Not on file  Social History Narrative   Not on file   Social Determinants of Health   Financial Resource Strain: [place]  (03/23/2022)   Overall Financial Resource Strain (CARDIA)    Difficulty of Paying Living Expenses: Not hard at all  Food Insecurity: No Food Insecurity (03/23/2022)   Hunger Vital Sign    Worried About Running Out of Food in the Last Year: Never true    [place] in the Last Year: Never true  Transportation Needs: No Transportation Needs (03/23/2022)   PRAPARE - Hydrologist (Medical): No    Lack of Transportation (Non-Medical): No  Physical Activity: Insufficiently Active (03/23/2022)   Exercise Vital Sign    Days of Exercise per Week: 3 days    Minutes of Exercise per Session: 30 min  Stress: No Stress Concern Present (03/23/2022)   [place]    Feeling of Stress : Not at all  Social Connections: Somewhat Isolated (08/13/2018)   Social Connection and Isolation Panel [NHANES]    Frequency of Communication with Friends and Family: More than three times a week    Frequency of Social Gatherings with Friends and Family: More than three times a week    Attends Religious Services: More than 4 times per year    Active Member of [employer] or Organizations: No    Attends Archivist Meetings: Never    Marital Status: Widowed    Tobacco Counseling Counseling given: Not Answered   Clinical Intake:  Pre-visit preparation completed: Yes  Pain : No/denies pain      Nutritional Status: BMI 25 -29 Overweight Nutritional Risks: None Diabetes: No  How often do you need to have someone help you when you read instructions, pamphlets, or other written materials from your doctor or pharmacy?: 1 - Never What is the last grade level you completed in school?: 70yr college  Diabetic? no  Interpreter Needed?: No  Information entered by :: NAllen LPN   Activities of Daily Living    03/23/2022    3:05 PM  In your present state of health, do you have any difficulty performing the following activities:  Hearing? 0  Vision? 0  Difficulty concentrating or making decisions? 1  Walking or climbing stairs? 0  Dressing or bathing? 0  Doing errands, shopping? 0  Preparing Food  and eating ? N  Using the Toilet? N  In the past six months, have you accidently leaked urine? N  Do you have problems with loss of bowel control? N  Managing your Medications? N  Managing your Finances? N  Housekeeping or managing your Housekeeping? N    Patient Care Team: [name], FNP as PCP - General (General Practice)  Indicate any recent Medical Services you may have received from other than [facility] providers in the past year (date may be approximate).     Assessment:   This is a routine wellness examination for [place].  Hearing/Vision screen Vision Screening - Comments:: No regular eye exams  Dietary issues and exercise activities discussed: Current Exercise Habits: Home exercise routine, Type of exercise: walking, Time (Minutes): 30, Frequency (Times/Week): 3, Weekly Exercise (Minutes/Week): 90   Goals Addressed             This Visit's Progress    Patient Stated       03/23/2022, no goals       Depression Screen    03/23/2022    3:05 PM 03/10/2021    3:34 PM 01/22/2020    2:42 PM 12/23/2019    9:22 AM 09/23/2019    9:15 AM 06/19/2019    8:52 AM 03/19/2019    9:17 AM  PHQ 2/9 Scores  PHQ - 2 Score 0 0 0 0 0 0 0  PHQ- 9 Score   0   0     Fall Risk     03/23/2022    3:05 PM 03/10/2021    3:34 PM 01/22/2020    2:41 PM 12/23/2019    9:22 AM 09/23/2019    9:15 AM  [place] in the past year? 0 0 0 0 0  Number falls in past yr: 0      Injury with Fall? 0      Risk for fall due to : Medication side effect Medication side effect Medication side effect    Follow up Falls evaluation completed;Education provided;Falls prevention discussed Falls evaluation completed;Education provided;Falls prevention discussed Falls evaluation completed;Education provided;Falls prevention discussed      FALL RISK PREVENTION PERTAINING TO THE HOME:  Any stairs in or around the home? Yes  If so, are there any without handrails? No  Home free of loose throw rugs in walkways, pet beds, electrical cords, etc? Yes  Adequate lighting in your home to reduce risk of falls? Yes   ASSISTIVE DEVICES UTILIZED TO PREVENT FALLS:  Life alert? No  Use of a cane, walker or w/c? No  Grab bars in the bathroom? Yes  Shower chair or bench in shower? No  Elevated toilet seat or a handicapped toilet? No   TIMED UP AND GO:  Was the test performed? No .      Cognitive Function:        03/23/2022    3:07 PM 03/10/2021    3:37 PM 01/22/2020    2:43 PM 06/19/2019    8:54 AM 08/13/2018   12:03 PM  6CIT Screen  What Year? 0 points 0 points 0 points 0 points 0 points  What month? 0 points 0 points 0 points 0 points 0 points  What time? 0 points 0 points 0 points 0 points 0 points  Count back from 20 0 points 0 points 0 points 0 points 0 points  Months in reverse 0 points 0 points 0 points 0 points 0 points  Repeat phrase 0 points  10 points 2 points 0 points 6 points  Total Score 0 points 10 points 2 points 0 points 6 points    Immunizations Immunization History  Administered Date(s) Administered   Fluad Quad(high Dose 65+) 09/01/2020   Influenza Whole 06/03/2008, 07/08/2009   Influenza-Unspecified 07/18/2011, 06/18/2018, 06/16/2019   Moderna Sars-Covid-2 Vaccination  10/14/2019, 11/11/2019   PFIZER(Purple Top)SARS-COV-2 Vaccination 08/02/2020, 01/04/2021   Pfizer Covid-19 Vaccine Bivalent Booster 21yr & up 06/17/2021   Pneumococcal Conjugate-13 06/21/2019   Pneumococcal Polysaccharide-23 06/09/2009   Pneumococcal-Unspecified 06/21/2019   Tdap 05/06/2013   Zoster Recombinat (Shingrix) 06/21/2019, 06/21/2019, 08/29/2019    TDAP status: Up to date  Flu Vaccine status: Up to date  Pneumococcal vaccine status: Up to date  Covid-19 vaccine status: Completed vaccines  Qualifies for Shingles Vaccine? Yes   Zostavax completed Yes   Shingrix Completed?: Yes  Screening Tests Health Maintenance  Topic Date Due   DEXA SCAN  Never done   COVID-19 Vaccine (6 - Moderna series) 10/17/2021   INFLUENZA VACCINE  04/18/2022   TETANUS/TDAP  05/07/2023   Pneumonia Vaccine 80 Years old  Completed   Zoster Vaccines- Shingrix  Completed   HPV VACCINES  Aged Out    Health Maintenance  Health Maintenance Due  Topic Date Due   DEXA SCAN  Never done   COVID-19 Vaccine (6 - Moderna series) 10/17/2021    Colorectal cancer screening: No longer required.   Mammogram status: No longer required due to age.  Bone Density status: Ordered today. Pt provided with contact info and advised to call to schedule appt.  Lung Cancer Screening: (Low Dose CT Chest recommended if Age 80-80years, 30 pack-year currently smoking OR have quit w/in 15years.) does not qualify.   Lung Cancer Screening Referral: no  Additional Screening:  Hepatitis C Screening: does not qualify;   Vision Screening: Recommended annual ophthalmology exams for early detection of glaucoma and other disorders of the eye. Is the patient up to date with their annual eye exam?  No  Who is the provider or what is the name of the office in which the patient attends annual eye exams? none If pt is not established with a provider, would they like to be referred to a provider to establish care? No .    Dental Screening: Recommended annual dental exams for proper oral hygiene  Community Resource Referral / Chronic Care Management: CRR required this visit?  No   CCM required this visit?  No      Plan:     I have personally reviewed and noted the following in the patient's chart:   Medical and social history Use of alcohol, tobacco or illicit drugs  Current medications and supplements including opioid prescriptions.  Functional ability and status Nutritional status Physical activity Advanced directives List of other physicians Hospitalizations, surgeries, and ER visits in previous 12 months Vitals Screenings to include cognitive, depression, and falls Referrals and appointments  In addition, I have reviewed and discussed with patient certain preventive protocols, quality metrics, and best practice recommendations. A written personalized care plan for preventive services as well as general preventive health recommendations were provided to patient.     NKellie Simmering LPN   [id number]  Nurse Notes: none  Due to this being a virtual visit, the after visit summary with patients personalized plan was offered to patient via mail or my-chart. , patient was mailed a copy of AVS./

## 2022-03-29 ENCOUNTER — Encounter: Payer: Self-pay | Admitting: Nurse Practitioner

## 2022-03-29 ENCOUNTER — Ambulatory Visit (INDEPENDENT_AMBULATORY_CARE_PROVIDER_SITE_OTHER): Payer: Medicare (Managed Care) | Admitting: Nurse Practitioner

## 2022-03-29 VITALS — BP 140/90 | HR 76 | Temp 98.0°F | Ht 66.0 in | Wt 171.0 lb

## 2022-03-29 DIAGNOSIS — E782 Mixed hyperlipidemia: Secondary | ICD-10-CM | POA: Diagnosis not present

## 2022-03-29 DIAGNOSIS — Z6827 Body mass index (BMI) 27.0-27.9, adult: Secondary | ICD-10-CM | POA: Diagnosis not present

## 2022-03-29 DIAGNOSIS — I119 Hypertensive heart disease without heart failure: Secondary | ICD-10-CM

## 2022-03-29 DIAGNOSIS — Z79899 Other long term (current) drug therapy: Secondary | ICD-10-CM

## 2022-03-29 DIAGNOSIS — E039 Hypothyroidism, unspecified: Secondary | ICD-10-CM | POA: Diagnosis not present

## 2022-03-29 MED ORDER — CARVEDILOL 6.25 MG PO TABS: 6.2500 mg | ORAL_TABLET | Freq: Two times a day (BID) | ORAL | 1 refills | Status: DC

## 2022-03-29 MED ORDER — VALSARTAN 160 MG PO TABS: 160.0000 mg | ORAL_TABLET | Freq: Every day | ORAL | 1 refills | Status: DC

## 2022-03-29 MED ORDER — LEVOTHYROXINE SODIUM 25 MCG PO TABS: ORAL_TABLET | ORAL | 1 refills | Status: DC

## 2022-03-29 MED ORDER — HYDROCHLOROTHIAZIDE 25 MG PO TABS: 25.0000 mg | ORAL_TABLET | Freq: Every day | ORAL | 1 refills | Status: DC

## 2022-03-29 MED ORDER — ATORVASTATIN CALCIUM 10 MG PO TABS: 10.0000 mg | ORAL_TABLET | Freq: Every day | ORAL | 1 refills | Status: DC

## 2022-03-29 NOTE — Progress Notes (Signed)
Barnet Glasgow Martin,acting as a Education administrator for [name], FNP.,have documented all relevant documentation on the behalf of [name], FNP,as directed by  [name], FNP while in the presence of [name], [place].    Subjective:     Patient ID: Janet Barber , female    DOB: 02-Dec-1940 , 81 y.o.   MRN: [id number]   Chief Complaint  Patient presents with   Hypothyroidism    HPI  Patient presents today for a thyroid follow up. She continues to live with her daughter Lanelle Bal. She is walking MWF - 3 days a week at [employer]. Her friend   She does not feel she has a problem with her memory when she got lost coming to the office she feels was due to the pandemic   Hypertension This is a chronic problem. The current episode started more than 1 year ago. The problem has been gradually worsening since onset. The problem is uncontrolled. Pertinent negatives include no anxiety, chest pain, headaches, palpitations or shortness of breath. Past treatments include angiotensin blockers. Compliance problems: follow up appts.  Identifiable causes of hypertension include a thyroid problem. There is no history of chronic renal disease.     Past Medical History:  Diagnosis Date   Allergic rhinitis    Anxiety    Colon cancer (HCC)    Diverticulosis of colon    History of hemorrhoids    Hx of colonic polyps    Hypercholesterolemia    Hypertension    Hypothyroidism    Lumbar back pain    Mitral valve prolapse    Overweight(278.02)    Venous insufficiency    Vitamin D deficiency      Family History  Problem Relation Age of Onset   Hypertension Maternal Grandmother      Current Outpatient Medications:    aspirin EC 81 MG tablet, Take 81 mg by mouth daily. Swallow whole., Disp: , Rfl:    fluticasone (FLONASE) 50 MCG/ACT nasal spray, SHAKE LIQUID AND USE 2 SPRAYS IN EACH NOSTRIL DAILY, Disp: 48 g, Rfl: 2   atorvastatin (LIPITOR) 10 MG tablet, Take 1 tablet (10 mg total) by mouth  daily., Disp: 90 tablet, Rfl: 1   carvedilol (COREG) 6.25 MG tablet, Take 1 tablet (6.25 mg total) by mouth 2 (two) times daily. PATIENT MUST SCHEDULE APPOINTMENT FOR FUTURE REFILLS., Disp: 180 tablet, Rfl: 1   hydrochlorothiazide (HYDRODIURIL) 25 MG tablet, Take 1 tablet (25 mg total) by mouth daily., Disp: 90 tablet, Rfl: 1   levothyroxine (SYNTHROID) 25 MCG tablet, TAKE 1 TABLET(25 MCG) BY MOUTH DAILY, Disp: 90 tablet, Rfl: 1   valsartan (DIOVAN) 160 MG tablet, Take 1 tablet (160 mg total) by mouth daily., Disp: 90 tablet, Rfl: 1   Allergies  Allergen Reactions   Shellfish Allergy Itching   Penicillins     REACTION: unsure of reaction---years ago     Review of Systems  Constitutional: Negative.  Negative for activity change and fatigue.  Eyes:  Negative for visual disturbance.  Respiratory: Negative.  Negative for choking, shortness of breath and wheezing.   Cardiovascular: Negative.  Negative for chest pain, palpitations and leg swelling.  Gastrointestinal: Negative.   Endocrine: Negative.  Negative for polydipsia, polyphagia and polyuria.  Musculoskeletal: Negative.   Skin: Negative.   Neurological:  Negative for dizziness, weakness and headaches.  Psychiatric/Behavioral:  Negative for confusion. The patient is not nervous/anxious.      Today's Vitals   03/29/22 1029 03/29/22 1117  BP: Marland Kitchen)  140/100 140/90  Pulse: 76   Temp: 98 F (36.7 C)   TempSrc: Oral   Weight: 171 lb (77.6 kg)   Height: _0  (1.676 m)   PainSc: 0-No pain    Body mass index is 27.6 kg/m.  Wt Readings from Last 3 Encounters:  03/29/22 171 lb (77.6 kg)  03/23/22 160 lb (72.6 kg)  09/21/21 172 lb 3.2 oz (78.1 kg)    Objective:  Physical Exam Vitals reviewed.  Constitutional:      General: She is not in acute distress.    Appearance: Normal appearance. She is well-developed.  HENT:     Head: Normocephalic and atraumatic.  Eyes:     Pupils: Pupils are equal, round, and reactive to light.   Cardiovascular:     Rate and Rhythm: Normal rate and regular rhythm.     Pulses: Normal pulses.     Heart sounds: Normal heart sounds. No murmur heard. Pulmonary:     Effort: Pulmonary effort is normal. No respiratory distress.     Breath sounds: Normal breath sounds. No wheezing.  Musculoskeletal:        General: Normal range of motion.  Skin:    General: Skin is warm and dry.     Capillary Refill: Capillary refill takes less than 2 seconds.  Neurological:     General: No focal deficit present.     Mental Status: She is alert and oriented to person, place, and time.     Cranial Nerves: No cranial nerve deficit.     Motor: No weakness.  Psychiatric:        Mood and Affect: Mood normal.        Behavior: Behavior normal.        Thought Content: Thought content normal.        Judgment: Judgment normal.         Assessment And Plan:     1. Hypertensive heart disease without heart failure Comments: Blood pressure is slightly elevated, repeat is improved. Encouraged to stay well hydrated with water - CMP14+EGFR - POCT Urinalysis Dipstick (81002) - hydrochlorothiazide (HYDRODIURIL) 25 MG tablet; Take 1 tablet (25 mg total) by mouth daily.  Dispense: 90 tablet; Refill: 1 - levothyroxine (SYNTHROID) 25 MCG tablet; TAKE 1 TABLET(25 MCG) BY MOUTH DAILY  Dispense: 90 tablet; Refill: 1 - valsartan (DIOVAN) 160 MG tablet; Take 1 tablet (160 mg total) by mouth daily.  Dispense: 90 tablet; Refill: 1  2. Hypothyroidism, unspecified type Comments: Thyroid levels were slightly elevated, will recheck today and make changes to medications pending labs. - TSH - T4, Free  3. Mixed hyperlipidemia Comments: Stable, continue current medications - Lipid panel - atorvastatin (LIPITOR) 10 MG tablet; Take 1 tablet (10 mg total) by mouth daily.  Dispense: 90 tablet; Refill: 1  4. BMI 27.0-27.9,adult  5. Other long term (current) drug therapy - CBC no Diff   Patient was given opportunity to ask  questions. Patient verbalized understanding of the plan and was able to repeat key elements of the plan. All questions were answered to their satisfaction.  [name], FNP   I, [name], FNP, have reviewed all documentation for this visit. The documentation on 03/29/22 for the exam, diagnosis, procedures, and orders are all accurate and complete.   IF YOU HAVE BEEN REFERRED TO A SPECIALIST, IT MAY TAKE 1-2 WEEKS TO SCHEDULE/PROCESS THE REFERRAL. IF YOU HAVE NOT HEARD FROM US/SPECIALIST IN TWO WEEKS, PLEASE GIVE [place] A CALL AT [phone] X [phone].  THE PATIENT IS ENCOURAGED TO PRACTICE SOCIAL DISTANCING DUE TO THE COVID-19 PANDEMIC.   

## 2022-03-29 NOTE — Patient Instructions (Signed)
Hyperthyroidism  Hyperthyroidism is when the thyroid gland is too active (overactive). The thyroid gland is a small gland located in the lower front part of the neck, just in front of the windpipe (trachea). This gland makes hormones that help control how the body uses food for energy (metabolism) as well as how the heart and brain function. These hormones also play a role in keeping your bones strong. When the thyroid is overactive, it produces too much of a hormone called thyroxine. What are the causes? This condition may be caused by: Graves' disease. This is a disorder in which the body's disease-fighting system (immune system) attacks the thyroid gland. This is the most common cause. Inflammation of the thyroid gland. A tumor in the thyroid gland. Use of certain medicines, including: Prescription thyroid hormone replacement. Herbal supplements that mimic thyroid hormones. Amiodarone therapy. Solid or fluid-filled lumps within your thyroid gland (thyroid nodules). Taking in a large amount of iodine from foods or medicines. What increases the risk? You are more likely to develop this condition if: You are female. You have a family history of thyroid conditions. You smoke tobacco. You use a medicine called lithium. You take medicines that affect the immune system (immunosuppressants). What are the signs or symptoms? Symptoms of this condition include: Nervousness. Inability to tolerate heat. Unexplained weight loss. Diarrhea. Change in the texture of hair or skin. Heart skipping beats or making extra beats. Rapid heart rate. Loss of menstruation. Shaky hands. Fatigue. Restlessness. Sleep problems. Enlarged thyroid gland or a lump in the thyroid (nodule). You may also have symptoms of Graves' disease, which may include: Protruding eyes. Dry eyes. Red or swollen eyes. Problems with vision. How is this diagnosed? This condition may be diagnosed based on: Your symptoms and  medical history. A physical exam. Blood tests. Thyroid ultrasound. This test involves using sound waves to produce images of the thyroid gland. A thyroid scan. A radioactive substance is injected into a vein, and images show how much iodine is present in the thyroid. Radioactive iodine uptake test (RAIU). A small amount of radioactive iodine is given by mouth to see how much iodine the thyroid absorbs after a certain amount of time. How is this treated? Treatment depends on the cause and severity of the condition. Treatment may include: Medicines to reduce the amount of thyroid hormone your body makes. Radioactive iodine treatment (radioiodine therapy). This involves swallowing a small dose of radioactive iodine, in capsule or liquid form, to kill thyroid cells. Surgery to remove part or all of your thyroid gland. You may need to take thyroid hormone replacement medicine for the rest of your life after thyroid surgery. Medicines to help manage your symptoms. Follow these instructions at home:  Take over-the-counter and prescription medicines only as told by your health care provider. Do not use any products that contain nicotine or tobacco, such as cigarettes and e-cigarettes. If you need help quitting, ask your health care provider. Follow any instructions from your health care provider about diet. You may be instructed to limit foods that contain iodine. Keep all follow-up visits as told by your health care provider. This is important. You will need to have blood tests regularly so that your health care provider can monitor your condition. Contact a health care provider if: Your symptoms do not get better with treatment. You have a fever. You are taking thyroid hormone replacement medicine and you: Have symptoms of depression. Feel like you are tired all the time. Gain weight. Get help   right away if: You have chest pain. You have decreased alertness or a change in your awareness. You  have abdominal pain. You feel dizzy. You have a rapid heartbeat. You have an irregular heartbeat. You have difficulty breathing. Summary The thyroid gland is a small gland located in the lower front part of the neck, just in front of the windpipe (trachea). Hyperthyroidism is when the thyroid gland is too active (overactive) and produces too much of a hormone called thyroxine. The most common cause is Graves' disease, a disorder in which your immune system attacks the thyroid gland. Hyperthyroidism can cause various symptoms, such as unexplained weight loss, nervousness, inability to tolerate heat, or changes in your heartbeat. Treatment may include medicine to reduce the amount of thyroid hormone your body makes, radioiodine therapy, surgery, or medicines to manage symptoms. This information is not intended to replace advice given to you by your health care provider. Make sure you discuss any questions you have with your health care provider. Document Revised: 09/16/2021 Document Reviewed: 05/20/2020 Elsevier Patient Education  2023 [employer]  

## 2022-03-30 LAB — CMP14+EGFR
ALT: 19 IU/L (ref 0–32)
AST: 28 IU/L (ref 0–40)
Albumin/Globulin Ratio: 1.6 (ref 1.2–2.2)
Albumin: 4.1 g/dL (ref 3.8–4.8)
Alkaline Phosphatase: 68 IU/L (ref 44–121)
BUN/Creatinine Ratio: 17 (ref 12–28)
BUN: 15 mg/dL (ref 8–27)
Bilirubin Total: 0.6 mg/dL (ref 0.0–1.2)
CO2: 24 mmol/L (ref 20–29)
Calcium: 10 mg/dL (ref 8.7–10.3)
Chloride: 102 mmol/L (ref 96–106)
Creatinine, Ser: 0.88 mg/dL (ref 0.57–1.00)
Globulin, Total: 2.6 g/dL (ref 1.5–4.5)
Glucose: 89 mg/dL (ref 70–99)
Potassium: 3.8 mmol/L (ref 3.5–5.2)
Sodium: 138 mmol/L (ref 134–144)
Total Protein: 6.7 g/dL (ref 6.0–8.5)
eGFR: 66 mL/min/{1.73_m2} (ref >59)

## 2022-03-30 LAB — LIPID PANEL
Chol/HDL Ratio: 3.2 ratio (ref 0.0–4.4)
Cholesterol, Total: 188 mg/dL (ref 100–199)
HDL: 59 mg/dL
LDL Chol Calc (NIH): 115 mg/dL — ABNORMAL HIGH (ref 0–99)
Triglycerides: 77 mg/dL (ref 0–149)
VLDL Cholesterol Cal: 14 mg/dL (ref 5–40)

## 2022-03-30 LAB — CBC
Hematocrit: 39.2 % (ref 34.0–46.6)
Hemoglobin: 12.9 g/dL (ref 11.1–15.9)
MCH: 30.5 pg (ref 26.6–33.0)
MCHC: 32.9 g/dL (ref 31.5–35.7)
MCV: 93 fL (ref 79–97)
Platelets: 183 10*3/uL (ref 150–450)
RBC: 4.23 x10E6/uL (ref 3.77–5.28)
RDW: 11.8 % (ref 11.7–15.4)
WBC: 5.9 10*3/uL (ref 3.4–10.8)

## 2022-03-30 LAB — TSH: TSH: 4.97 u[IU]/mL — ABNORMAL HIGH (ref 0.450–4.500)

## 2022-03-30 LAB — T4, FREE: Free T4: 1.25 ng/dL (ref 0.82–1.77)

## 2022-04-07 ENCOUNTER — Telehealth: Payer: Self-pay

## 2022-04-07 NOTE — Chronic Care Management (AMB) (Signed)
    Called Janet Barber, No answer, left message of appointment on 04-11-2022 at 12:00 via telephone visit with [name], Pharm D. Notified to have all medications, supplements, blood pressure and/or blood sugar logs available during appointment and to return call if need to reschedule.    [facility] Pharmacist Assistant [phone]

## 2022-04-11 ENCOUNTER — Telehealth: Payer: Self-pay

## 2022-04-11 ENCOUNTER — Telehealth: Payer: Medicare HMO

## 2022-04-11 NOTE — Chronic Care Management (AMB) (Signed)
    Chronic Care Management Pharmacy Assistant   Name: Janet Barber  MRN: [id number] DOB: [birth year]   Reason for Encounter: Disease State/ Hypertension  Recent office visits:  03-29-2022 [name], [place]. LDL= 115. TSH= 4.970.  03-23-2022 Kellie Simmering, LPN. Medicare wellness visit. Order placed for DG Bone Density.  Recent consult visits:  None  Hospital visits:  None in previous 6 months  Medications: Outpatient Encounter Medications as of 04/11/2022  Medication Sig   aspirin EC 81 MG tablet Take 81 mg by mouth daily. Swallow whole.   atorvastatin (LIPITOR) 10 MG tablet Take 1 tablet (10 mg total) by mouth daily.   carvedilol (COREG) 6.25 MG tablet Take 1 tablet (6.25 mg total) by mouth 2 (two) times daily. PATIENT MUST SCHEDULE APPOINTMENT FOR FUTURE REFILLS.   fluticasone (FLONASE) 50 MCG/ACT nasal spray SHAKE LIQUID AND USE 2 SPRAYS IN EACH NOSTRIL DAILY   hydrochlorothiazide (HYDRODIURIL) 25 MG tablet Take 1 tablet (25 mg total) by mouth daily.   levothyroxine (SYNTHROID) 25 MCG tablet TAKE 1 TABLET(25 MCG) BY MOUTH DAILY   valsartan (DIOVAN) 160 MG tablet Take 1 tablet (160 mg total) by mouth daily.   No facility-administered encounter medications on file as of 04/11/2022.  Reviewed chart prior to disease state call. Spoke with patient regarding BP  Recent Office Vitals: BP Readings from Last 3 Encounters:  03/29/22 140/90  09/21/21 132/90  09/01/20 128/86   Pulse Readings from Last 3 Encounters:  03/29/22 76  09/21/21 (!) 105  09/01/20 83    Wt Readings from Last 3 Encounters:  03/29/22 171 lb (77.6 kg)  03/23/22 160 lb (72.6 kg)  09/21/21 172 lb 3.2 oz (78.1 kg)     Kidney Function Lab Results  Component Value Date/Time   CREATININE 0.88 03/29/2022 11:17 AM   CREATININE 0.92 09/21/2021 10:40 AM   GFR 73.10 06/21/2011 10:48 AM   GFRNONAA 59 (L) 09/01/2020 04:11 PM   GFRAA 68 09/01/2020 04:11 PM       Latest Ref Rng & Units 03/29/2022    11:17 AM 09/21/2021   10:40 AM 09/01/2020    4:11 PM  BMP  Glucose 70 - 99 mg/dL 89  108  81   BUN 8 - 27 mg/dL '15  14  13   '$ Creatinine 0.57 - 1.00 mg/dL 0.88  0.92  0.93   BUN/Creat Ratio 12 - '28 17  15  14   '$ Sodium 134 - 144 mmol/L 138  144  141   Potassium 3.5 - 5.2 mmol/L 3.8  3.4  4.2   Chloride 96 - 106 mmol/L 102  106  104   CO2 20 - 29 mmol/L '24  26  25   '$ Calcium 8.7 - 10.3 mg/dL 10.0  9.6  9.7     04-11-2022: 1st attempt. Canceled today's appointment 04-14-2022: 2nd attempt left VM 04-17-2022: 3rd attempt left VM  Care Gaps: None  Star Rating Drugs: Valsartan 160 mg- Last filled 02-21-2022 90 DS [facility] Atorvastatin 10 mg- Last filled 03-02-2022  90 DS [place] Clinical Pharmacist Assistant [phone]

## 2022-04-14 ENCOUNTER — Other Ambulatory Visit: Payer: Self-pay | Admitting: Nurse Practitioner

## 2022-04-14 DIAGNOSIS — I119 Hypertensive heart disease without heart failure: Secondary | ICD-10-CM

## 2022-04-24 ENCOUNTER — Ambulatory Visit: Payer: Medicare (Managed Care) | Admitting: Nurse Practitioner

## 2022-07-31 ENCOUNTER — Ambulatory Visit: Payer: Self-pay | Admitting: Nurse Practitioner

## 2022-08-01 ENCOUNTER — Encounter: Payer: Self-pay | Admitting: Nurse Practitioner

## 2022-08-01 ENCOUNTER — Ambulatory Visit (INDEPENDENT_AMBULATORY_CARE_PROVIDER_SITE_OTHER): Payer: Medicare PPO | Admitting: Nurse Practitioner

## 2022-08-01 VITALS — BP 140/98 | HR 74 | Temp 98.1°F | Ht 66.0 in | Wt 170.8 lb

## 2022-08-01 DIAGNOSIS — E039 Hypothyroidism, unspecified: Secondary | ICD-10-CM

## 2022-08-01 DIAGNOSIS — E782 Mixed hyperlipidemia: Secondary | ICD-10-CM | POA: Diagnosis not present

## 2022-08-01 DIAGNOSIS — Z79899 Other long term (current) drug therapy: Secondary | ICD-10-CM

## 2022-08-01 DIAGNOSIS — E559 Vitamin D deficiency, unspecified: Secondary | ICD-10-CM

## 2022-08-01 DIAGNOSIS — I119 Hypertensive heart disease without heart failure: Secondary | ICD-10-CM | POA: Diagnosis not present

## 2022-08-01 MED ORDER — VALSARTAN 160 MG PO TABS: 160.0000 mg | ORAL_TABLET | Freq: Every day | ORAL | 1 refills | Status: DC

## 2022-08-01 MED ORDER — HYDROCHLOROTHIAZIDE 25 MG PO TABS: 25.0000 mg | ORAL_TABLET | Freq: Every day | ORAL | 1 refills | Status: DC

## 2022-08-01 MED ORDER — LEVOTHYROXINE SODIUM 25 MCG PO TABS: ORAL_TABLET | ORAL | 1 refills | Status: DC

## 2022-08-01 MED ORDER — CARVEDILOL 6.25 MG PO TABS: 6.2500 mg | ORAL_TABLET | Freq: Two times a day (BID) | ORAL | 1 refills | Status: DC

## 2022-08-01 NOTE — Patient Instructions (Signed)
Hypertension, Adult ?Hypertension is another name for high blood pressure. High blood pressure forces your heart to work harder to pump blood. This can cause problems over time. ?There are two numbers in a blood pressure reading. There is a top number (systolic) over a bottom number (diastolic). It is best to have a blood pressure that is below 120/80. ?What are the causes? ?The cause of this condition is not known. Some other conditions can lead to high blood pressure. ?What increases the risk? ?Some lifestyle factors can make you more likely to develop high blood pressure: ?Smoking. ?Not getting enough exercise or physical activity. ?Being overweight. ?Having too much fat, sugar, calories, or salt (sodium) in your diet. ?Drinking too much alcohol. ?Other risk factors include: ?Having any of these conditions: ?Heart disease. ?Diabetes. ?High cholesterol. ?Kidney disease. ?Obstructive sleep apnea. ?Having a family history of high blood pressure and high cholesterol. ?Age. The risk increases with age. ?Stress. ?What are the signs or symptoms? ?High blood pressure may not cause symptoms. Very high blood pressure (hypertensive crisis) may cause: ?Headache. ?Fast or uneven heartbeats (palpitations). ?Shortness of breath. ?Nosebleed. ?Vomiting or feeling like you may vomit (nauseous). ?Changes in how you see. ?Very bad chest pain. ?Feeling dizzy. ?Seizures. ?How is this treated? ?This condition is treated by making healthy lifestyle changes, such as: ?Eating healthy foods. ?Exercising more. ?Drinking less alcohol. ?Your doctor may prescribe medicine if lifestyle changes do not help enough and if: ?Your top number is above 130. ?Your bottom number is above 80. ?Your personal target blood pressure may vary. ?Follow these instructions at home: ?Eating and drinking ? ?If told, follow the DASH eating plan. To follow this plan: ?Fill one half of your plate at each meal with fruits and vegetables. ?Fill one fourth of your plate  at each meal with whole grains. Whole grains include whole-wheat pasta, brown rice, and whole-grain bread. ?Eat or drink low-fat dairy products, such as skim milk or low-fat yogurt. ?Fill one fourth of your plate at each meal with low-fat (lean) proteins. Low-fat proteins include fish, chicken without skin, eggs, beans, and tofu. ?Avoid fatty meat, cured and processed meat, or chicken with skin. ?Avoid pre-made or processed food. ?Limit the amount of salt in your diet to less than 1,500 mg each day. ?Do not drink alcohol if: ?Your doctor tells you not to drink. ?You are pregnant, may be pregnant, or are planning to become pregnant. ?If you drink alcohol: ?Limit how much you have to: ?0-1 drink a day for women. ?0-2 drinks a day for men. ?Know how much alcohol is in your drink. In the [place] one drink equals one 12 oz bottle of beer (355 mL), one 5 oz glass of wine (148 mL), or one 1? oz glass of hard liquor (44 mL). ?Lifestyle ? ?Work with your doctor to stay at a healthy weight or to lose weight. Ask your doctor what the best weight is for you. ?Get at least 30 minutes of exercise that causes your heart to beat faster (aerobic exercise) most days of the week. This may include walking, swimming, or biking. ?Get at least 30 minutes of exercise that strengthens your muscles (resistance exercise) at least 3 days a week. This may include lifting weights or doing Pilates. ?Do not smoke or use any products that contain nicotine or tobacco. If you need help quitting, ask your doctor. ?Check your blood pressure at home as told by your doctor. ?Keep all follow-up visits. ?Medicines ?Take over-the-counter and prescription medicines   only as told by your doctor. Follow directions carefully. ?Do not skip doses of blood pressure medicine. The medicine does not work as well if you skip doses. Skipping doses also puts you at risk for problems. ?Ask your doctor about side effects or reactions to medicines that you should watch  for. ?Contact a doctor if: ?You think you are having a reaction to the medicine you are taking. ?You have headaches that keep coming back. ?You feel dizzy. ?You have swelling in your ankles. ?You have trouble with your vision. ?Get help right away if: ?You get a very bad headache. ?You start to feel mixed up (confused). ?You feel weak or numb. ?You feel faint. ?You have very bad pain in your: ?Chest. ?Belly (abdomen). ?You vomit more than once. ?You have trouble breathing. ?These symptoms may be an emergency. Get help right away. Call 911. ?Do not wait to see if the symptoms will go away. ?Do not drive yourself to the hospital. ?Summary ?Hypertension is another name for high blood pressure. ?High blood pressure forces your heart to work harder to pump blood. ?For most people, a normal blood pressure is less than 120/80. ?Making healthy choices can help lower blood pressure. If your blood pressure does not get lower with healthy choices, you may need to take medicine. ?This information is not intended to replace advice given to you by your health care provider. Make sure you discuss any questions you have with your health care provider. ?Document Revised: 06/23/2021 Document Reviewed: 06/23/2021 ?Elsevier Patient Education ? 2023 Elsevier Inc. ? ?

## 2022-08-01 NOTE — Progress Notes (Signed)
I,Victoria T Hamilton,acting as a Education administrator for [name], FNP.,have documented all relevant documentation on the behalf of [name], FNP,as directed by  [name], FNP while in the presence of [name], [place].    Subjective:     Patient ID: Janet Barber , female    DOB: 14-Jan-1941 , 81 y.o.   MRN: [id number]   Chief Complaint  Patient presents with   Hypertension    HPI  Patient presents today for a bpc. She continues to live with her daughter Lanelle Bal. She is walking MWF - 3 days a week at [employer]. With her friend.   She reports taking bp med before coming to appt today.  Denies headache, chest pain, sob, blurred vision.      Hypertension This is a chronic problem. The current episode started more than 1 year ago. The problem has been gradually worsening since onset. The problem is uncontrolled. Pertinent negatives include no anxiety, chest pain, headaches, palpitations or shortness of breath. Past treatments include angiotensin blockers. Compliance problems: follow up appts.  Identifiable causes of hypertension include a thyroid problem. There is no history of chronic renal disease.     Past Medical History:  Diagnosis Date   Allergic rhinitis    Anxiety    Colon cancer (HCC)    Diverticulosis of colon    History of hemorrhoids    Hx of colonic polyps    Hypercholesterolemia    Hypertension    Hypothyroidism    Lumbar back pain    Mitral valve prolapse    Overweight(278.02)    Venous insufficiency    Vitamin D deficiency      Family History  Problem Relation Age of Onset   Hypertension Maternal Grandmother      Current Outpatient Medications:    aspirin EC 81 MG tablet, Take 81 mg by mouth daily. Swallow whole., Disp: , Rfl:    atorvastatin (LIPITOR) 10 MG tablet, Take 1 tablet (10 mg total) by mouth daily., Disp: 90 tablet, Rfl: 1   fluticasone (FLONASE) 50 MCG/ACT nasal spray, SHAKE LIQUID AND USE 2 SPRAYS IN EACH NOSTRIL DAILY, Disp:  48 g, Rfl: 2   carvedilol (COREG) 6.25 MG tablet, Take 1 tablet (6.25 mg total) by mouth 2 (two) times daily., Disp: 180 tablet, Rfl: 1   hydrochlorothiazide (HYDRODIURIL) 25 MG tablet, Take 1 tablet (25 mg total) by mouth daily., Disp: 90 tablet, Rfl: 1   levothyroxine (SYNTHROID) 25 MCG tablet, TAKE 1 TABLET(25 MCG) BY MOUTH DAILY, Disp: 90 tablet, Rfl: 1   valsartan (DIOVAN) 160 MG tablet, Take 1 tablet (160 mg total) by mouth daily., Disp: 90 tablet, Rfl: 1   Allergies  Allergen Reactions   Other Shortness Of Breath   Shellfish Allergy Itching   Penicillins     REACTION: unsure of reaction---years ago     Review of Systems  Constitutional: Negative.   Respiratory: Negative.  Negative for shortness of breath.   Cardiovascular: Negative.  Negative for chest pain and palpitations.  Neurological: Negative.  Negative for headaches.  Psychiatric/Behavioral: Negative.       Today's Vitals   08/01/22 0944 08/01/22 1038  BP: (!) 160/122 (!) 140/98  Pulse: 74   Temp: 98.1 F (36.7 C)   SpO2: 98%   Weight: 170 lb 12.8 oz (77.5 kg)   Height: _0  (1.676 m)   PainSc: 0-No pain    Body mass index is 27.57 kg/m.  Wt Readings from Last 3 Encounters:  08/01/22 170 lb 12.8 oz (77.5 kg)  03/29/22 171 lb (77.6 kg)  03/23/22 160 lb (72.6 kg)    Objective:  Physical Exam Vitals reviewed.  Constitutional:      General: She is not in acute distress.    Appearance: Normal appearance.  Cardiovascular:     Rate and Rhythm: Normal rate and regular rhythm.     Pulses: Normal pulses.     Heart sounds: Normal heart sounds. No murmur heard. Pulmonary:     Effort: Pulmonary effort is normal. No respiratory distress.     Breath sounds: Normal breath sounds. No wheezing.  Skin:    Capillary Refill: Capillary refill takes less than 2 seconds.  Neurological:     General: No focal deficit present.     Mental Status: She is alert and oriented to person, place, and time.     Cranial Nerves: No  cranial nerve deficit.     Motor: No weakness.  Psychiatric:        Mood and Affect: Mood normal.        Behavior: Behavior normal.        Thought Content: Thought content normal.        Judgment: Judgment normal.         Assessment And Plan:     1. Hypertensive heart disease without heart failure Comments: She has not been taking her Coreg twice a day. Sent new Rx and referral to [facility] to help with medication management. - CMP14+EGFR - carvedilol (COREG) 6.25 MG tablet; Take 1 tablet (6.25 mg total) by mouth 2 (two) times daily.  Dispense: 180 tablet; Refill: 1 - hydrochlorothiazide (HYDRODIURIL) 25 MG tablet; Take 1 tablet (25 mg total) by mouth daily.  Dispense: 90 tablet; Refill: 1 - levothyroxine (SYNTHROID) 25 MCG tablet; TAKE 1 TABLET(25 MCG) BY MOUTH DAILY  Dispense: 90 tablet; Refill: 1 - valsartan (DIOVAN) 160 MG tablet; Take 1 tablet (160 mg total) by mouth daily.  Dispense: 90 tablet; Refill: 1 - AMB Referral to Chronic Care Management Services  2. Hypothyroidism, unspecified type Comments: Thyroid levels are normal, will make changes to medications pending labs - CMP14+EGFR - TSH + free T4  3. Mixed hyperlipidemia Comments: Cholesterol levels are stable. Continue statin, tolerating well - Lipid panel - AMB Referral to Chronic Care Management Services  4. Vitamin D deficiency Will check vitamin D level and supplement as needed.    Also encouraged to spend 15 minutes in the sun daily.   5. Other long term (current) drug therapy - CBC    Patient was given opportunity to ask questions. Patient verbalized understanding of the plan and was able to repeat key elements of the plan. All questions were answered to their satisfaction.  [name], FNP   I, [name], FNP, have reviewed all documentation for this visit. The documentation on 08/01/22 for the exam, diagnosis, procedures, and orders are all accurate and complete.   IF YOU HAVE BEEN REFERRED TO A  SPECIALIST, IT MAY TAKE 1-2 WEEKS TO SCHEDULE/PROCESS THE REFERRAL. IF YOU HAVE NOT HEARD FROM US/SPECIALIST IN TWO WEEKS, PLEASE GIVE [place] A CALL AT [phone].   THE PATIENT IS ENCOURAGED TO PRACTICE SOCIAL DISTANCING DUE TO THE COVID-19 PANDEMIC.

## 2022-08-02 LAB — CMP14+EGFR
ALT: 15 IU/L (ref 0–32)
AST: 24 IU/L (ref 0–40)
Albumin/Globulin Ratio: 1.6 (ref 1.2–2.2)
Albumin: 4.2 g/dL (ref 3.8–4.8)
Alkaline Phosphatase: 68 IU/L (ref 44–121)
BUN/Creatinine Ratio: 14 (ref 12–28)
BUN: 14 mg/dL (ref 8–27)
Bilirubin Total: 0.9 mg/dL (ref 0.0–1.2)
CO2: 27 mmol/L (ref 20–29)
Calcium: 10 mg/dL (ref 8.7–10.3)
Chloride: 104 mmol/L (ref 96–106)
Creatinine, Ser: 1.03 mg/dL — ABNORMAL HIGH (ref 0.57–1.00)
Globulin, Total: 2.7 g/dL (ref 1.5–4.5)
Glucose: 114 mg/dL — ABNORMAL HIGH (ref 70–99)
Potassium: 3.6 mmol/L (ref 3.5–5.2)
Sodium: 145 mmol/L — ABNORMAL HIGH (ref 134–144)
Total Protein: 6.9 g/dL (ref 6.0–8.5)
eGFR: 55 mL/min/{1.73_m2} — ABNORMAL LOW (ref >59)

## 2022-08-02 LAB — CBC
Hematocrit: 40.4 % (ref 34.0–46.6)
Hemoglobin: 13.1 g/dL (ref 11.1–15.9)
MCH: 29.8 pg (ref 26.6–33.0)
MCHC: 32.4 g/dL (ref 31.5–35.7)
MCV: 92 fL (ref 79–97)
Platelets: 192 10*3/uL (ref 150–450)
RBC: 4.4 x10E6/uL (ref 3.77–5.28)
RDW: 11.5 % — ABNORMAL LOW (ref 11.7–15.4)
WBC: 5.6 10*3/uL (ref 3.4–10.8)

## 2022-08-02 LAB — LIPID PANEL
Chol/HDL Ratio: 3.2 ratio (ref 0.0–4.4)
Cholesterol, Total: 201 mg/dL — ABNORMAL HIGH (ref 100–199)
HDL: 63 mg/dL (ref >39)
LDL Chol Calc (NIH): 126 mg/dL — ABNORMAL HIGH (ref 0–99)
Triglycerides: 66 mg/dL (ref 0–149)
VLDL Cholesterol Cal: 12 mg/dL (ref 5–40)

## 2022-08-02 LAB — TSH+FREE T4
Free T4: 1.47 ng/dL (ref 0.82–1.77)
TSH: 4.92 u[IU]/mL — ABNORMAL HIGH (ref 0.450–4.500)

## 2022-08-16 ENCOUNTER — Telehealth: Payer: Self-pay

## 2022-08-16 MED ORDER — LEVOTHYROXINE SODIUM 50 MCG PO TABS: 50.0000 ug | ORAL_TABLET | Freq: Every day | ORAL | 1 refills | Status: DC

## 2022-08-16 NOTE — Progress Notes (Signed)
  Chronic Care Management   Note  08/16/2022 Name: Janet Barber MRN: [id number] DOB: 1940/12/23  Janet Barber is a 81 y.o. year old female who is a primary care patient of [name], [place]. I reached out to Janet Barber by phone today in response to a referral sent by Ms. Lowella Grip Harps's PCP.  Ms. Jenan Ellegood was not successfully contacted today. A HIPAA compliant voice message was left requesting a return call.   Follow up plan: Additional outreach attempts will be made.  Noreene Larsson, [place], [place] [address] Direct Dial: [phone] [email or web address]

## 2022-09-05 NOTE — Progress Notes (Signed)
  Chronic Care Management   Note  09/05/2022 Name: Janet Barber MRN: [id number] DOB: 03-Dec-1940  Janet Barber is a 81 y.o. year old female who is a primary care patient of [name], [place]. I reached out to Janet Barber by phone today in response to a referral sent by Janet Barber's PCP.  The second contact attempt was unsuccessful.   Follow up plan: Additional outreach attempts will be made.  Noreene Larsson, [place], [place] [address] Direct Dial: [phone] [email or web address]

## 2022-09-29 NOTE — Progress Notes (Signed)
  Chronic Care Management   Note  09/29/2022 Name: Kyriaki Moder MRN: [id number] DOB: 04/10/1941  Janet Barber is a 82 y.o. year old female who is a primary care patient of [name], [place]. I reached out to Janet Barber by phone today in response to a referral sent by Ms. Lowella Grip Venard's PCP.  The third contact attempt was unsuccessful.   Follow up plan: Unable to reach patient after 3 attempts. No further outreach attempts will be made pending additional provider engagement, patient request, or new provider order.   Noreene Larsson, [place], [place] [address] Direct Dial: [phone] [email or web address]

## 2022-10-02 ENCOUNTER — Ambulatory Visit: Payer: Medicare (Managed Care) | Admitting: Nurse Practitioner

## 2022-10-05 ENCOUNTER — Ambulatory Visit (INDEPENDENT_AMBULATORY_CARE_PROVIDER_SITE_OTHER): Payer: Medicare HMO | Admitting: Nurse Practitioner

## 2022-10-05 ENCOUNTER — Encounter: Payer: Self-pay | Admitting: Nurse Practitioner

## 2022-10-05 VITALS — BP 132/78 | Temp 98.5°F | Ht 63.8 in | Wt 170.6 lb

## 2022-10-05 DIAGNOSIS — E782 Mixed hyperlipidemia: Secondary | ICD-10-CM

## 2022-10-05 DIAGNOSIS — N289 Disorder of kidney and ureter, unspecified: Secondary | ICD-10-CM

## 2022-10-05 DIAGNOSIS — Z23 Encounter for immunization: Secondary | ICD-10-CM

## 2022-10-05 DIAGNOSIS — E039 Hypothyroidism, unspecified: Secondary | ICD-10-CM | POA: Diagnosis not present

## 2022-10-05 DIAGNOSIS — I119 Hypertensive heart disease without heart failure: Secondary | ICD-10-CM

## 2022-10-05 MED ORDER — ATORVASTATIN CALCIUM 10 MG PO TABS: 10.0000 mg | ORAL_TABLET | Freq: Every day | ORAL | 1 refills | Status: DC

## 2022-10-05 NOTE — Patient Instructions (Addendum)
Hypertension, Adult High blood pressure (hypertension) is when the force of blood pumping through the arteries is too strong. The arteries are the blood vessels that carry blood from the heart throughout the body. Hypertension forces the heart to work harder to pump blood and may cause arteries to become narrow or stiff. Untreated or uncontrolled hypertension can lead to a heart attack, heart failure, a stroke, kidney disease, and other problems. A blood pressure reading consists of a higher number over a lower number. Ideally, your blood pressure should be below 120/80. The first ("top") number is called the systolic pressure. It is a measure of the pressure in your arteries as your heart beats. The second ("bottom") number is called the diastolic pressure. It is a measure of the pressure in your arteries as the heart relaxes. What are the causes? The exact cause of this condition is not known. There are some conditions that result in high blood pressure. What increases the risk? Certain factors may make you more likely to develop high blood pressure. Some of these risk factors are under your control, including: Smoking. Not getting enough exercise or physical activity. Being overweight. Having too much fat, sugar, calories, or salt (sodium) in your diet. Drinking too much alcohol. Other risk factors include: Having a personal history of heart disease, diabetes, high cholesterol, or kidney disease. Stress. Having a family history of high blood pressure and high cholesterol. Having obstructive sleep apnea. Age. The risk increases with age. What are the signs or symptoms? High blood pressure may not cause symptoms. Very high blood pressure (hypertensive crisis) may cause: Headache. Fast or irregular heartbeats (palpitations). Shortness of breath. Nosebleed. Nausea and vomiting. Vision changes. Severe chest pain, dizziness, and seizures. How is this diagnosed? This condition is diagnosed by  measuring your blood pressure while you are seated, with your arm resting on a flat surface, your legs uncrossed, and your feet flat on the floor. The cuff of the blood pressure monitor will be placed directly against the skin of your upper arm at the level of your heart. Blood pressure should be measured at least twice using the same arm. Certain conditions can cause a difference in blood pressure between your right and left arms. If you have a high blood pressure reading during one visit or you have normal blood pressure with other risk factors, you may be asked to: Return on a different day to have your blood pressure checked again. Monitor your blood pressure at home for 1 week or longer. If you are diagnosed with hypertension, you may have other blood or imaging tests to help your health care provider understand your overall risk for other conditions. How is this treated? This condition is treated by making healthy lifestyle changes, such as eating healthy foods, exercising more, and reducing your alcohol intake. You may be referred for counseling on a healthy diet and physical activity. Your health care provider may prescribe medicine if lifestyle changes are not enough to get your blood pressure under control and if: Your systolic blood pressure is above 130. Your diastolic blood pressure is above 80. Your personal target blood pressure may vary depending on your medical conditions, your age, and other factors. Follow these instructions at home: Eating and drinking  Eat a diet that is high in fiber and potassium, and low in sodium, added sugar, and fat. An example of this eating plan is called the DASH diet. DASH stands for Dietary Approaches to Stop Hypertension. To eat this way: Eat   plenty of fresh fruits and vegetables. Try to fill one half of your plate at each meal with fruits and vegetables. Eat whole grains, such as whole-wheat pasta, brown rice, or whole-grain bread. Fill about one  fourth of your plate with whole grains. Eat or drink low-fat dairy products, such as skim milk or low-fat yogurt. Avoid fatty cuts of meat, processed or cured meats, and poultry with skin. Fill about one fourth of your plate with lean proteins, such as fish, chicken without skin, beans, eggs, or tofu. Avoid pre-made and processed foods. These tend to be higher in sodium, added sugar, and fat. Reduce your daily sodium intake. Many people with hypertension should eat less than 1,500 mg of sodium a day. Do not drink alcohol if: Your health care provider tells you not to drink. You are pregnant, may be pregnant, or are planning to become pregnant. If you drink alcohol: Limit how much you have to: 0-1 drink a day for women. 0-2 drinks a day for men. Know how much alcohol is in your drink. In the [place] one drink equals one 12 oz bottle of beer (355 mL), one 5 oz glass of wine (148 mL), or one 1 oz glass of hard liquor (44 mL). Lifestyle  Work with your health care provider to maintain a healthy body weight or to lose weight. Ask what an ideal weight is for you. Get at least 30 minutes of exercise that causes your heart to beat faster (aerobic exercise) most days of the week. Activities may include walking, swimming, or biking. Include exercise to strengthen your muscles (resistance exercise), such as Pilates or lifting weights, as part of your weekly exercise routine. Try to do these types of exercises for 30 minutes at least 3 days a week. Do not use any products that contain nicotine or tobacco. These products include cigarettes, chewing tobacco, and vaping devices, such as e-cigarettes. If you need help quitting, ask your health care provider. Monitor your blood pressure at home as told by your health care provider. Keep all follow-up visits. This is important. Medicines Take over-the-counter and prescription medicines only as told by your health care provider. Follow directions carefully. Blood  pressure medicines must be taken as prescribed. Do not skip doses of blood pressure medicine. Doing this puts you at risk for problems and can make the medicine less effective. Ask your health care provider about side effects or reactions to medicines that you should watch for. Contact a health care provider if you: Think you are having a reaction to a medicine you are taking. Have headaches that keep coming back (recurring). Feel dizzy. Have swelling in your ankles. Have trouble with your vision. Get help right away if you: Develop a severe headache or confusion. Have unusual weakness or numbness. Feel faint. Have severe pain in your chest or abdomen. Vomit repeatedly. Have trouble breathing. These symptoms may be an emergency. Get help right away. Call 911. Do not wait to see if the symptoms will go away. Do not drive yourself to the hospital. Summary Hypertension is when the force of blood pumping through your arteries is too strong. If this condition is not controlled, it may put you at risk for serious complications. Your personal target blood pressure may vary depending on your medical conditions, your age, and other factors. For most people, a normal blood pressure is less than 120/80. Hypertension is treated with lifestyle changes, medicines, or a combination of both. Lifestyle changes include losing weight, eating a healthy,   low-sodium diet, exercising more, and limiting alcohol. This information is not intended to replace advice given to you by your health care provider. Make sure you discuss any questions you have with your health care provider. Document Revised: 07/12/2021 Document Reviewed: 07/12/2021 Elsevier Patient Education  [place].   Influenza (Flu) Vaccine (Inactivated or Recombinant): What You Need to Know 1. Why get vaccinated? Influenza vaccine can prevent influenza (flu). Flu is a contagious disease that spreads around the [place] every year,  usually between October and May. Anyone can get the flu, but it is more dangerous for some people. Infants and young children, people 30 years and older, pregnant people, and people with certain health conditions or a weakened immune system are at greatest risk of flu complications. Pneumonia, bronchitis, sinus infections, and ear infections are examples of flu-related complications. If you have a medical condition, such as heart disease, cancer, or diabetes, flu can make it worse. Flu can cause fever and chills, sore throat, muscle aches, fatigue, cough, headache, and runny or stuffy nose. Some people may have vomiting and diarrhea, though this is more common in children than adults. In an average year, thousands of people in the [place] die from flu, and many more are hospitalized. Flu vaccine prevents millions of illnesses and flu-related visits to the doctor each year. 2. Influenza vaccines CDC recommends everyone 6 months and older get vaccinated every flu season. Children 6 months through 56 years of age may need 2 doses during a single flu season. Everyone else needs only 1 dose each flu season. It takes about 2 weeks for protection to develop after vaccination. There are many flu viruses, and they are always changing. Each year a new flu vaccine is made to protect against the influenza viruses believed to be likely to cause disease in the upcoming flu season. Even when the vaccine doesn't exactly match these viruses, it may still provide some protection. Influenza vaccine does not cause flu. Influenza vaccine may be given at the same time as other vaccines. 3. Talk with your health care provider Tell your vaccination provider if the person getting the vaccine: Has had an allergic reaction after a previous dose of influenza vaccine, or has any severe, life-threatening allergies Has ever had Guillain-Barr Syndrome (also called "GBS") In some cases, your health care provider may decide to  postpone influenza vaccination until a future visit. Influenza vaccine can be administered at any time during pregnancy. People who are or will be pregnant during influenza season should receive inactivated influenza vaccine. People with minor illnesses, such as a cold, may be vaccinated. People who are moderately or severely ill should usually wait until they recover before getting influenza vaccine. Your health care provider can give you more information. 4. Risks of a vaccine reaction Soreness, redness, and swelling where the shot is given, fever, muscle aches, and headache can happen after influenza vaccination. There may be a very small increased risk of Guillain-Barr Syndrome (GBS) after inactivated influenza vaccine (the flu shot). Young children who get the flu shot along with pneumococcal vaccine (PCV13) and/or DTaP vaccine at the same time might be slightly more likely to have a seizure caused by fever. Tell your health care provider if a child who is getting flu vaccine has ever had a seizure. People sometimes faint after medical procedures, including vaccination. Tell your provider if you feel dizzy or have vision changes or ringing in the ears. As with any medicine, there is a very remote  chance of a vaccine causing a severe allergic reaction, other serious injury, or death. 5. What if there is a serious problem? An allergic reaction could occur after the vaccinated person leaves the clinic. If you see signs of a severe allergic reaction (hives, swelling of the face and throat, difficulty breathing, a fast heartbeat, dizziness, or weakness), call 9-1-1 and get the person to the nearest hospital. For other signs that concern you, call your health care provider. Adverse reactions should be reported to the Vaccine Adverse Event Reporting System (VAERS). Your health care provider will usually file this report, or you can do it yourself. Visit the VAERS website at www.vaers.SamedayNews.es or call  (862)143-0763. VAERS is only for reporting reactions, and VAERS staff members do not give medical advice. 6. The National Vaccine Injury Compensation Program The [employer] Vaccine Injury Compensation Program (VICP) is a federal program that was created to compensate people who may have been injured by certain vaccines. Claims regarding alleged injury or death due to vaccination have a time limit for filing, which may be as short as two years. Visit the VICP website at GoldCloset.com.ee or call (650)146-7492 to learn about the program and about filing a claim. 7. How can I learn more? Ask your health care provider. Call your local or state health department. Visit the website of the Food and Drug Administration (FDA) for vaccine package inserts and additional information at TraderRating.uy. Contact the Centers for Disease Control and Prevention (CDC): Call 2192871594 (1-800-CDC-INFO) or Visit CDC's website at https://gibson.com/. Source: CDC Vaccine Information Statement Inactivated Influenza Vaccine (04/23/2020) This same material is available at http://www.wolf.info/ for no charge. This information is not intended to replace advice given to you by your health care provider. Make sure you discuss any questions you have with your health care provider. Document Revised: 08/02/2021 Document Reviewed: 05/26/2021 Elsevier Patient Education  [place].

## 2022-10-05 NOTE — Progress Notes (Signed)
I,Tianna Badgett,acting as a Education administrator for [employer], FNP.,have documented all relevant documentation on the behalf of [name], FNP,as directed by  [name], FNP while in the presence of [name], [place].  Subjective:     Patient ID: Janet Barber , female    DOB: 24-Aug-1941 , 82 y.o.   MRN: [id number]   Chief Complaint  Patient presents with   Hypertension    HPI  Patient presents today for a bpc.   Hypertension This is a chronic problem. The current episode started more than 1 year ago. The problem has been gradually worsening since onset. The problem is uncontrolled. Pertinent negatives include no anxiety, chest pain, headaches, palpitations or shortness of breath. Past treatments include angiotensin blockers. Compliance problems: follow up appts.  Identifiable causes of hypertension include a thyroid problem. There is no history of chronic renal disease.     Past Medical History:  Diagnosis Date   Allergic rhinitis    Anxiety    Colon cancer (HCC)    Diverticulosis of colon    History of hemorrhoids    Hx of colonic polyps    Hypercholesterolemia    Hypertension    Hypothyroidism    Lumbar back pain    Mitral valve prolapse    Overweight(278.02)    Venous insufficiency    Vitamin D deficiency      Family History  Problem Relation Age of Onset   Hypertension Maternal Grandmother      Current Outpatient Medications:    aspirin EC 81 MG tablet, Take 81 mg by mouth daily. Swallow whole., Disp: , Rfl:    carvedilol (COREG) 6.25 MG tablet, Take 1 tablet (6.25 mg total) by mouth 2 (two) times daily., Disp: 180 tablet, Rfl: 1   hydrochlorothiazide (HYDRODIURIL) 25 MG tablet, Take 1 tablet (25 mg total) by mouth daily., Disp: 90 tablet, Rfl: 1   levothyroxine (SYNTHROID) 50 MCG tablet, Take 1 tablet (50 mcg total) by mouth daily before breakfast., Disp: 90 tablet, Rfl: 1   valsartan (DIOVAN) 160 MG tablet, Take 1 tablet (160 mg total) by mouth daily., Disp:  90 tablet, Rfl: 1   atorvastatin (LIPITOR) 10 MG tablet, Take 1 tablet (10 mg total) by mouth daily., Disp: 90 tablet, Rfl: 1   fluticasone (FLONASE) 50 MCG/ACT nasal spray, SHAKE LIQUID AND USE 2 SPRAYS IN EACH NOSTRIL DAILY (Patient not taking: Reported on 10/05/2022), Disp: 48 g, Rfl: 2   Allergies  Allergen Reactions   Other Shortness Of Breath   Shellfish Allergy Itching   Penicillins     REACTION: unsure of reaction---years ago     Review of Systems  Constitutional: Negative.   Respiratory: Negative.  Negative for shortness of breath and wheezing.   Cardiovascular: Negative.  Negative for chest pain, palpitations and leg swelling.  Gastrointestinal: Negative.   Neurological: Negative.  Negative for headaches.  Psychiatric/Behavioral: Negative.       Today's Vitals   10/05/22 1202 10/05/22 1235  BP: 132/78 132/78  Temp: 98.5 F (36.9 C)   TempSrc: Oral   Weight: 170 lb 9.6 oz (77.4 kg)   Height: 5' 3.8" (1.621 m)    Body mass index is 29.47 kg/m.   Objective:  Physical Exam Vitals reviewed.  Constitutional:      General: She is not in acute distress.    Appearance: Normal appearance.  Cardiovascular:     Rate and Rhythm: Normal rate and regular rhythm.     Pulses: Normal pulses.  Heart sounds: Normal heart sounds. No murmur heard. Pulmonary:     Effort: Pulmonary effort is normal. No respiratory distress.     Breath sounds: Normal breath sounds. No wheezing.  Skin:    Capillary Refill: Capillary refill takes less than 2 seconds.  Neurological:     General: No focal deficit present.     Mental Status: She is alert and oriented to person, place, and time.     Cranial Nerves: No cranial nerve deficit.     Motor: No weakness.  Psychiatric:        Mood and Affect: Mood normal.        Behavior: Behavior normal.        Thought Content: Thought content normal.        Judgment: Judgment normal.         Assessment And Plan:     1. Hypertensive heart  disease without heart failure Comments: Blood pressure is fairly controlled.  Continue current medications.  Will check eGFR. - BMP8+EGFR  2. Hypothyroidism, unspecified type Comments: Thyroid levels were underactive.  Will check thyroid levels today. - Thyroid Panel With TSH  3. Abnormal kidney function Comments: Will recheck EGFR.  Encouraged to limit intake of ibuprofen or aleve, NSAIDs - BMP8+EGFR  4. Mixed hyperlipidemia Comments: Stable, continue current medications - atorvastatin (LIPITOR) 10 MG tablet; Take 1 tablet (10 mg total) by mouth daily.  Dispense: 90 tablet; Refill: 1  5. Need for influenza vaccination Influenza vaccine administered Encouraged to take Tylenol as needed for fever or muscle aches. - Flu vaccine HIGH DOSE PF (Fluzone High dose)     Patient was given opportunity to ask questions. Patient verbalized understanding of the plan and was able to repeat key elements of the plan. All questions were answered to their satisfaction.  [name], FNP   I, [name], FNP, have reviewed all documentation for this visit. The documentation on 10/05/22 for the exam, diagnosis, procedures, and orders are all accurate and complete.   IF YOU HAVE BEEN REFERRED TO A SPECIALIST, IT MAY TAKE 1-2 WEEKS TO SCHEDULE/PROCESS THE REFERRAL. IF YOU HAVE NOT HEARD FROM US/SPECIALIST IN TWO WEEKS, PLEASE GIVE [place] A CALL AT [phone].   THE PATIENT IS ENCOURAGED TO PRACTICE SOCIAL DISTANCING DUE TO THE COVID-19 PANDEMIC.

## 2022-10-06 LAB — BMP8+EGFR
BUN/Creatinine Ratio: 13 (ref 12–28)
BUN: 13 mg/dL (ref 8–27)
CO2: 22 mmol/L (ref 20–29)
Calcium: 9.9 mg/dL (ref 8.7–10.3)
Chloride: 107 mmol/L — ABNORMAL HIGH (ref 96–106)
Creatinine, Ser: 1 mg/dL (ref 0.57–1.00)
Glucose: 88 mg/dL (ref 70–99)
Potassium: 3.4 mmol/L — ABNORMAL LOW (ref 3.5–5.2)
Sodium: 145 mmol/L — ABNORMAL HIGH (ref 134–144)
eGFR: 57 mL/min/{1.73_m2} — ABNORMAL LOW (ref >59)

## 2022-10-06 LAB — THYROID PANEL WITH TSH
Free Thyroxine Index: 2.6 (ref 1.2–4.9)
T3 Uptake Ratio: 26 % (ref 24–39)
T4, Total: 9.9 ug/dL (ref 4.5–12.0)
TSH: 1.83 u[IU]/mL (ref 0.450–4.500)

## 2022-11-29 ENCOUNTER — Telehealth: Payer: Self-pay

## 2022-11-29 NOTE — Telephone Encounter (Signed)
Patient called answering service stating she has been trying to get in contact with the office, with no calls back. To address a stomach issue she has been having. Called patient, unable to reach patient. Left voicemail for patient to give the office a call back dial ext. [phone].

## 2022-11-30 ENCOUNTER — Ambulatory Visit: Payer: Medicare HMO | Admitting: Nurse Practitioner

## 2023-04-04 ENCOUNTER — Ambulatory Visit: Payer: Medicare (Managed Care) | Admitting: Nurse Practitioner

## 2023-04-04 NOTE — Progress Notes (Deleted)
[name], CMA,acting as a Neurosurgeon for [name], FNP.,have documented all relevant documentation on the behalf of [name], FNP,as directed by  [name], FNP while in the presence of [name], FNP.  Subjective:  Patient ID: Janet Barber , female    DOB: July 10, 1941 , 82 y.o.   MRN: [id number]  No chief complaint on file.   HPI  Patient presents today for a BP, and Chol follow up. Patient reports compliance with medications, patient denies any chest pains, SOB, or headaches. Patient has no other concerns today.     Past Medical History:  Diagnosis Date  . Allergic rhinitis   . Anxiety   . Colon cancer (HCC)   . Diverticulosis of colon   . History of hemorrhoids   . Hx of colonic polyps   . Hypercholesterolemia   . Hypertension   . Hypothyroidism   . Lumbar back pain   . Mitral valve prolapse   . Overweight(278.02)   . Venous insufficiency   . Vitamin D deficiency      Family History  Problem Relation Age of Onset  . Hypertension Maternal Grandmother      Current Outpatient Medications:  .  aspirin EC 81 MG tablet, Take 81 mg by mouth daily. Swallow whole., Disp: , Rfl:  .  atorvastatin (LIPITOR) 10 MG tablet, Take 1 tablet (10 mg total) by mouth daily., Disp: 90 tablet, Rfl: 1 .  carvedilol (COREG) 6.25 MG tablet, Take 1 tablet (6.25 mg total) by mouth 2 (two) times daily., Disp: 180 tablet, Rfl: 1 .  fluticasone (FLONASE) 50 MCG/ACT nasal spray, SHAKE LIQUID AND USE 2 SPRAYS IN EACH NOSTRIL DAILY (Patient not taking: Reported on 10/05/2022), Disp: 48 g, Rfl: 2 .  hydrochlorothiazide (HYDRODIURIL) 25 MG tablet, Take 1 tablet (25 mg total) by mouth daily., Disp: 90 tablet, Rfl: 1 .  levothyroxine (SYNTHROID) 50 MCG tablet, Take 1 tablet (50 mcg total) by mouth daily before breakfast., Disp: 90 tablet, Rfl: 1 .  valsartan (DIOVAN) 160 MG tablet, Take 1 tablet (160 mg total) by mouth daily., Disp: 90 tablet, Rfl: 1   Allergies  Allergen Reactions  .  Other Shortness Of Breath  . Shellfish Allergy Itching  . Penicillins     REACTION: unsure of reaction---years ago     Review of Systems   There were no vitals filed for this visit. There is no height or weight on file to calculate BMI.  Wt Readings from Last 3 Encounters:  10/05/22 170 lb 9.6 oz (77.4 kg)  08/01/22 170 lb 12.8 oz (77.5 kg)  03/29/22 171 lb (77.6 kg)    The ASCVD Risk score (Arnett DK, et al., 2019) failed to calculate for the following reasons:   The 2019 ASCVD risk score is only valid for ages 76 to 23  Objective:  Physical Exam      Assessment And Plan:  Essential hypertension  Mixed hyperlipidemia  Hypothyroidism, unspecified type  Vitamin D deficiency    No follow-ups on file.  Patient was given opportunity to ask questions. Patient verbalized understanding of the plan and was able to repeat key elements of the plan. All questions were answered to their satisfaction.    Jeanell Sparrow, FNP, have reviewed all documentation for this visit. The documentation on 04/04/23 for the exam, diagnosis, procedures, and orders are all accurate and complete.   IF YOU HAVE BEEN REFERRED TO A SPECIALIST, IT MAY TAKE 1-2 WEEKS TO SCHEDULE/PROCESS THE REFERRAL. IF  YOU HAVE NOT HEARD FROM US/SPECIALIST IN TWO WEEKS, PLEASE GIVE [place] A CALL AT [phone].

## 2023-04-09 ENCOUNTER — Other Ambulatory Visit: Payer: Self-pay

## 2023-04-09 ENCOUNTER — Encounter (HOSPITAL_BASED_OUTPATIENT_CLINIC_OR_DEPARTMENT_OTHER): Payer: Self-pay | Admitting: Emergency Medicine

## 2023-04-09 ENCOUNTER — Emergency Department (HOSPITAL_BASED_OUTPATIENT_CLINIC_OR_DEPARTMENT_OTHER)
Admission: EM | Admit: 2023-04-09 | Discharge: 2023-04-10 | Disposition: A | Discharge status: Home / Self Care | Payer: No Typology Code available for payment source | Attending: Emergency Medicine | Admitting: Emergency Medicine

## 2023-04-09 DIAGNOSIS — Z20822 Contact with and (suspected) exposure to covid-19: Secondary | ICD-10-CM | POA: Diagnosis not present

## 2023-04-09 DIAGNOSIS — Z79899 Other long term (current) drug therapy: Secondary | ICD-10-CM | POA: Diagnosis not present

## 2023-04-09 DIAGNOSIS — R410 Disorientation, unspecified: Secondary | ICD-10-CM | POA: Insufficient documentation

## 2023-04-09 DIAGNOSIS — Z85038 Personal history of other malignant neoplasm of large intestine: Secondary | ICD-10-CM | POA: Diagnosis not present

## 2023-04-09 DIAGNOSIS — Z7982 Long term (current) use of aspirin: Secondary | ICD-10-CM | POA: Diagnosis not present

## 2023-04-09 DIAGNOSIS — I10 Essential (primary) hypertension: Secondary | ICD-10-CM | POA: Diagnosis not present

## 2023-04-09 DIAGNOSIS — R4182 Altered mental status, unspecified: Secondary | ICD-10-CM | POA: Diagnosis present

## 2023-04-09 DIAGNOSIS — E039 Hypothyroidism, unspecified: Secondary | ICD-10-CM

## 2023-04-09 DIAGNOSIS — I119 Hypertensive heart disease without heart failure: Secondary | ICD-10-CM

## 2023-04-09 MED ORDER — VALSARTAN 160 MG PO TABS
160.0000 mg | ORAL_TABLET | Freq: Every day | ORAL | 1 refills | Status: DC
Start: 2023-04-09 — End: 2023-05-17

## 2023-04-09 MED ORDER — LEVOTHYROXINE SODIUM 50 MCG PO TABS
50.0000 ug | ORAL_TABLET | Freq: Every day | ORAL | 1 refills | Status: DC
Start: 2023-04-09 — End: 2023-04-23

## 2023-04-09 MED ORDER — CARVEDILOL 6.25 MG PO TABS
6.2500 mg | ORAL_TABLET | Freq: Two times a day (BID) | ORAL | 1 refills | Status: DC
Start: 2023-04-09 — End: 2023-05-17

## 2023-04-09 NOTE — ED Triage Notes (Signed)
Patient arrives accompanied with daughter who reports increasing confusion. Daughter states that this has progressively increased over the last few months however there is greater concern because today, the patient drove herself over to the mall where she usually walks every MWF, patient was estimated to be out there for 6 hours, and was located [place] the [place] [employer] [facility] and was lost for several hours. Daughter states she spoke with her at 75 am which she seemed to be confused then. When asking about a last normal there seems to be episodes of confusion every day. Also concern that patient may be COVID positive as the daughter tested positive 8 days ago and patient now has a cough.

## 2023-04-10 ENCOUNTER — Emergency Department (HOSPITAL_BASED_OUTPATIENT_CLINIC_OR_DEPARTMENT_OTHER): Payer: No Typology Code available for payment source

## 2023-04-10 LAB — CBC WITH DIFFERENTIAL/PLATELET
Abs Immature Granulocytes: 0 10*3/uL (ref 0.00–0.07)
Basophils Absolute: 0 10*3/uL (ref 0.0–0.1)
Basophils Relative: 1 %
Eosinophils Absolute: 0.1 10*3/uL (ref 0.0–0.5)
Eosinophils Relative: 2 %
HCT: 42.2 % (ref 36.0–46.0)
Hemoglobin: 13.5 g/dL (ref 12.0–15.0)
Immature Granulocytes: 0 %
Lymphocytes Relative: 22 %
Lymphs Abs: 1.2 10*3/uL (ref 0.7–4.0)
MCH: 29.7 pg (ref 26.0–34.0)
MCHC: 32 g/dL (ref 30.0–36.0)
MCV: 93 fL (ref 80.0–100.0)
Monocytes Absolute: 0.6 10*3/uL (ref 0.1–1.0)
Monocytes Relative: 10 %
Neutro Abs: 3.7 10*3/uL (ref 1.7–7.7)
Neutrophils Relative %: 65 %
Platelets: 154 10*3/uL (ref 150–400)
RBC: 4.54 MIL/uL (ref 3.87–5.11)
RDW: 12 % (ref 11.5–15.5)
WBC: 5.7 10*3/uL (ref 4.0–10.5)
nRBC: 0 % (ref 0.0–0.2)

## 2023-04-10 LAB — COMPREHENSIVE METABOLIC PANEL
ALT: 19 U/L (ref 0–44)
AST: 24 U/L (ref 15–41)
Albumin: 3.9 g/dL (ref 3.5–5.0)
Alkaline Phosphatase: 58 U/L (ref 38–126)
Anion gap: 8 (ref 5–15)
BUN: 17 mg/dL (ref 8–23)
CO2: 24 mmol/L (ref 22–32)
Calcium: 9.4 mg/dL (ref 8.9–10.3)
Chloride: 110 mmol/L (ref 98–111)
Creatinine, Ser: 1.19 mg/dL — ABNORMAL HIGH (ref 0.44–1.00)
GFR, Estimated: 46 mL/min — ABNORMAL LOW (ref >60)
Glucose, Bld: 111 mg/dL — ABNORMAL HIGH (ref 70–99)
Potassium: 3.3 mmol/L — ABNORMAL LOW (ref 3.5–5.1)
Sodium: 142 mmol/L (ref 135–145)
Total Bilirubin: 1.1 mg/dL (ref 0.3–1.2)
Total Protein: 7.4 g/dL (ref 6.5–8.1)

## 2023-04-10 LAB — LACTIC ACID, PLASMA
Lactic Acid, Venous: 0.8 mmol/L (ref 0.5–1.9)
Lactic Acid, Venous: 0.8 mmol/L (ref 0.5–1.9)

## 2023-04-10 LAB — TROPONIN I (HIGH SENSITIVITY)
Troponin I (High Sensitivity): 6 ng/L (ref <18)
Troponin I (High Sensitivity): 6 ng/L (ref <18)

## 2023-04-10 LAB — LIPASE, BLOOD: Lipase: 32 U/L (ref 11–51)

## 2023-04-10 LAB — AMMONIA: Ammonia: <10 umol/L (ref 9–35)

## 2023-04-10 LAB — CBG MONITORING, ED: Glucose-Capillary: 108 mg/dL — ABNORMAL HIGH (ref 70–99)

## 2023-04-10 LAB — TSH: TSH: 4.582 u[IU]/mL — ABNORMAL HIGH (ref 0.350–4.500)

## 2023-04-10 LAB — SARS CORONAVIRUS 2 BY RT PCR: SARS Coronavirus 2 by RT PCR: NEGATIVE

## 2023-04-10 MED ORDER — CARVEDILOL 6.25 MG PO TABS
6.2500 mg | ORAL_TABLET | Freq: Once | ORAL | Status: AC
  Administered 2023-04-10: 6.25 mg via ORAL
  Filled 2023-04-10: qty 1

## 2023-04-10 MED ORDER — HYDROCHLOROTHIAZIDE 25 MG PO TABS
25.0000 mg | ORAL_TABLET | Freq: Once | ORAL | Status: AC
  Administered 2023-04-10: 25 mg via ORAL
  Filled 2023-04-10: qty 1

## 2023-04-10 MED ORDER — IRBESARTAN 150 MG PO TABS
150.0000 mg | ORAL_TABLET | Freq: Every day | ORAL | Status: DC
  Administered 2023-04-10: 150 mg via ORAL
  Filled 2023-04-10: qty 1

## 2023-04-10 NOTE — ED Provider Notes (Addendum)
[facility] Provider Note   CSN: [id number] Arrival date & time: 04/09/23  2318     History  Chief Complaint  Patient presents with   Altered Mental Status    Janet Barber is a 82 y.o. female.  Patient here with daughter for altered mental status and confusion.  Daughter is concerned for dementia but patient does not have any formal diagnosis.  Daughter reports patient has had increased confusion over the past several weeks to months.  She has not seen her doctor regarding this.  Daughter became concerned today because the [place] apparently 10 or 11 hours and was found by a family friend.  She does not recall driving there.  Daughter states patient normally drives to the mall to walk every Monday Wednesday and Friday.  She reports she did do this today and does not not recall driving to the convention [place].  Daughter feels she went to the convention center instead of the mall. Daughter found her this evening around 10 PM and she has apparently been there since about noon.  Daughter is concern for possibility of dementia but patient has no formal diagnosis of this.  Patient herself has no complaints.  She denies any pain.  No recent illness.  No fever, chills, nausea, vomiting.  No chest pain or shortness of breath.  No pain with urination or blood in the urine.  Has had multiple COVID exposures at home but no symptoms herself. No focal weakness, numbness or tingling.  No dizziness or lightheadedness.  No fever or vomiting. No headache.  She lives with her daughter.   History includes hypertension, mitral valve prolapse, history of colon cancer, anxiety and back pain.  The history is provided by the patient and a relative.  Altered Mental Status Associated symptoms: no abdominal pain, no fever, no headaches, no nausea, no rash, no vomiting and no weakness        Home  Medications Prior to Admission medications   Medication Sig Start Date End Date Taking? Authorizing Provider  aspirin EC 81 MG tablet Take 81 mg by mouth daily. Swallow whole.    [provider]  atorvastatin (LIPITOR) 10 MG tablet Take 1 tablet (10 mg total) by mouth daily. 10/05/22   [name], FNP  carvedilol (COREG) 6.25 MG tablet Take 1 tablet (6.25 mg total) by mouth 2 (two) times daily. 04/09/23   [name], FNP  fluticasone (FLONASE) 50 MCG/ACT nasal spray SHAKE LIQUID AND USE 2 SPRAYS IN [facility] NOSTRIL DAILY Patient not taking: Reported on 10/05/2022 09/21/21   [name], FNP  hydrochlorothiazide (HYDRODIURIL) 25 MG tablet Take 1 tablet (25 mg total) by mouth daily. 08/01/22   [name], FNP  levothyroxine (SYNTHROID) 50 MCG tablet Take 1 tablet (50 mcg total) by mouth daily before breakfast. 04/09/23   [name], FNP  valsartan (DIOVAN) 160 MG tablet Take 1 tablet (160 mg total) by mouth daily. 04/09/23   [name], FNP      Allergies    Other, Shellfish allergy, and Penicillins    Review of Systems   Review of Systems  Constitutional:  Positive for activity change. Negative for appetite change, fatigue and fever.  HENT:  Negative for congestion and rhinorrhea.   Respiratory:  Negative for cough, chest tightness and shortness of breath.   Cardiovascular:  Negative for chest pain.  Gastrointestinal:  Negative for abdominal pain, nausea and  vomiting.  Genitourinary:  Negative for dysuria and hematuria.  Musculoskeletal:  Negative for arthralgias and myalgias.  Skin:  Negative for rash.  Neurological:  Negative for dizziness, weakness and headaches.   all other systems are negative except as noted in the HPI and PMH.    Physical Exam Updated Vital Signs BP (!) 183/138 (BP Location: Right Arm)   Pulse (!) 106   Temp 97.8 F (36.6 C) (Oral)   Resp 18   Ht 5\' 3"  (1.6 m)   Wt 77 kg   SpO2 96%   BMI 30.07 kg/m  Physical Exam Vitals and nursing note  reviewed.  Constitutional:      General: She is not in acute distress.    Appearance: She is well-developed.  HENT:     Head: Normocephalic and atraumatic.     Mouth/Throat:     Pharynx: No oropharyngeal exudate.  Eyes:     Conjunctiva/sclera: Conjunctivae normal.     Pupils: Pupils are equal, round, and reactive to light.  Neck:     Comments: No meningismus. Cardiovascular:     Rate and Rhythm: Normal rate and regular rhythm.     Heart sounds: Normal heart sounds. No murmur heard. Pulmonary:     Effort: Pulmonary effort is normal. No respiratory distress.     Breath sounds: Normal breath sounds.  Abdominal:     Palpations: Abdomen is soft.     Tenderness: There is no abdominal tenderness. There is no guarding or rebound.  Musculoskeletal:        General: No tenderness. Normal range of motion.     Cervical back: Normal range of motion and neck supple.  Skin:    General: Skin is warm.  Neurological:     Mental Status: She is alert and oriented to person, place, and time.     Cranial Nerves: No cranial nerve deficit.     Motor: No abnormal muscle tone.     Coordination: Coordination normal.     Comments: CN 2-12 intact, no ataxia on finger to nose, no nystagmus, 5/5 strength throughout, no pronator drift, Romberg negative, normal gait.  Oriented to person, place and July.  Psychiatric:        Behavior: Behavior normal.     ED Results / Procedures / Treatments   Labs (all labs ordered are listed, but only abnormal results are displayed) Labs Reviewed  COMPREHENSIVE METABOLIC PANEL - Abnormal; Notable for the following components:      Result Value   Potassium 3.3 (*)    Glucose, Bld 111 (*)    Creatinine, Ser 1.19 (*)    GFR, Estimated 46 (*)    All other components within normal limits  CBG MONITORING, ED - Abnormal; Notable for the following components:   Glucose-Capillary 108 (*)    All other components within normal limits  SARS CORONAVIRUS 2 BY RT PCR  CBC  WITH DIFFERENTIAL/PLATELET  AMMONIA  LIPASE, BLOOD  LACTIC ACID, PLASMA  LACTIC ACID, PLASMA  TSH  URINALYSIS, ROUTINE W REFLEX MICROSCOPIC  TROPONIN I (HIGH SENSITIVITY)  TROPONIN I (HIGH SENSITIVITY)    EKG EKG Interpretation Date/Time:  Tuesday April 10 2023 00:35:24 EDT Ventricular Rate:  91 PR Interval:  189 QRS Duration:  91 QT Interval:  355 QTC Calculation: 437 R Axis:   -8  Text Interpretation: Sinus rhythm Consider anterior infarct No significant change was found Confirmed by Glynn Octave [phone]) on 04/10/2023 12:56:20 AM  Radiology CT Head Wo Contrast  Result Date:  04/10/2023 CLINICAL DATA:  Altered mental status EXAM: CT HEAD WITHOUT CONTRAST TECHNIQUE: Contiguous axial images were obtained from the base of the skull through the vertex without intravenous contrast. RADIATION DOSE REDUCTION: This exam was performed according to the departmental dose-optimization program which includes automated exposure control, adjustment of the mA and/or kV according to patient size and/or use of iterative reconstruction technique. COMPARISON:  None Available. FINDINGS: Brain: There is atrophy and chronic small vessel disease changes. No acute intracranial abnormality. Specifically, no hemorrhage, hydrocephalus, mass lesion, acute infarction, or significant intracranial injury. Vascular: No hyperdense vessel or unexpected calcification. Skull: No acute calvarial abnormality. Sinuses/Orbits: No acute findings Other: None IMPRESSION: Atrophy, chronic microvascular disease. No acute intracranial abnormality. Electronically Signed   By: Charlett Nose M.D.   On: 04/10/2023 00:22   DG Chest 2 View  Result Date: 04/10/2023 CLINICAL DATA:  Altered mental status. EXAM: CHEST - 2 VIEW COMPARISON:  Chest radiograph dated 06/15/2010. FINDINGS: The heart size and mediastinal contours are within normal limits. Both lungs are clear. The visualized skeletal structures are unremarkable. IMPRESSION: No  active cardiopulmonary disease. Electronically Signed   By: Elgie Collard M.D.   On: 04/10/2023 00:21    Procedures Procedures    Medications Ordered in ED Medications - No data to display  ED Course/ Medical Decision Making/ A&P                             Medical Decision Making Amount and/or Complexity of Data Reviewed Labs: ordered. Decision-making details documented in ED Course. Radiology: ordered and independent interpretation performed. Decision-making details documented in ED Course. ECG/medicine tests: ordered and independent interpretation performed. Decision-making details documented in ED Course.  Risk Prescription drug management.  Acute on chronic mental status change with confusion and concern for dementia.  On arrival patient was hypertensive.  She has no focal neurological deficits.  Low suspicion for acute CVA.  Will obtain basic labs, CT head, EKG, urinalysis  Workup is reassuring.  Electrolytes are within normal limits.  Chest x-ray is negative for infiltrate.  CT head without acute findings.  Results reviewed and interpreted by me.  Patient unable to give urine sample.  They declined catheterization.  Suspect likely early onset of dementia.  No new focal neurological deficits to suspect stroke.  Hypertensive.  Did not take her blood pressure medications today.  She is given her home medication doses.  Low suspicion for hypertensive emergency. Low suspicion for PRES.  Her mental status changes have been ongoing for many months. MRI not available at this facility but low suspicion for acute CVA.  Patient is alert and oriented x 3.  Discussed with patient and daughter that patient should not be driving.  Will refer to neurology for further evaluation of her mental status changes.  Patient and daughter requesting discharge at this time.  They declined to give a urine sample and declined catheterization.  Blood pressure remains elevated but home medications  are given.  She denies headache.  She is alert and oriented x 3.  No chest pain or shortness of breath.  Concern for possible underlying dementia.  Discussed that patient should not be driving. Follow-up with PCP as well as neurology.  Return precautions discussed. Message sent to PCP to coordinate care.        Final Clinical Impression(s) / ED Diagnoses Final diagnoses:  Confusion    Rx / DC Orders ED Discharge Orders  None         Glynn Octave, MD 04/10/23 1610    Glynn Octave, MD 04/10/23 [phone]

## 2023-04-10 NOTE — ED Notes (Signed)
Pt denied urine sample, pt and pt mother educated on importance of sample and verbalized understanding

## 2023-04-10 NOTE — ED Notes (Signed)
Cannot discharge, registration in chart.   

## 2023-04-10 NOTE — Discharge Instructions (Addendum)
As we discussed we are considering dementia but need further testing done by your primary doctor and neurologist.  Your CT scan of your head is normal.  Your blood pressure today is elevated and he should take your blood pressure medication as prescribed and keep a record of this to follow-up with your doctor.  You declined to give a urine sample today and urine infection has not been ruled out. You should not drive, bathe alone, swim alone or operate heavy machinery. Follow-up with the neurologist as well as her primary doctor.  Return to the ED with new or worsening symptoms.

## 2023-04-11 ENCOUNTER — Ambulatory Visit: Payer: Medicare (Managed Care) | Admitting: Nurse Practitioner

## 2023-04-17 ENCOUNTER — Telehealth: Payer: Self-pay | Admitting: Physician Assistant

## 2023-04-17 NOTE — Telephone Encounter (Signed)
Pt dropped off papers from previous pcp and recent hospital visit. Pt also picked up new patient paperwork to bring to appointment. Pt not sure if they have seen PCP since 2018. Papers placed in PCP's tray in front office.

## 2023-04-17 NOTE — Telephone Encounter (Signed)
Paper work given to provider and information from previous pcp in epic

## 2023-04-19 ENCOUNTER — Emergency Department (HOSPITAL_BASED_OUTPATIENT_CLINIC_OR_DEPARTMENT_OTHER): Payer: No Typology Code available for payment source

## 2023-04-19 ENCOUNTER — Other Ambulatory Visit: Payer: Self-pay

## 2023-04-19 ENCOUNTER — Emergency Department (HOSPITAL_BASED_OUTPATIENT_CLINIC_OR_DEPARTMENT_OTHER)
Admission: EM | Admit: 2023-04-19 | Discharge: 2023-04-19 | Disposition: A | Discharge status: Home / Self Care | Payer: No Typology Code available for payment source | Attending: Emergency Medicine | Admitting: Emergency Medicine

## 2023-04-19 DIAGNOSIS — Z79899 Other long term (current) drug therapy: Secondary | ICD-10-CM | POA: Insufficient documentation

## 2023-04-19 DIAGNOSIS — R410 Disorientation, unspecified: Secondary | ICD-10-CM | POA: Diagnosis not present

## 2023-04-19 DIAGNOSIS — E039 Hypothyroidism, unspecified: Secondary | ICD-10-CM | POA: Insufficient documentation

## 2023-04-19 DIAGNOSIS — F0390 Unspecified dementia without behavioral disturbance: Secondary | ICD-10-CM | POA: Insufficient documentation

## 2023-04-19 DIAGNOSIS — I6782 Cerebral ischemia: Secondary | ICD-10-CM | POA: Insufficient documentation

## 2023-04-19 DIAGNOSIS — R4182 Altered mental status, unspecified: Secondary | ICD-10-CM | POA: Diagnosis present

## 2023-04-19 DIAGNOSIS — Z85038 Personal history of other malignant neoplasm of large intestine: Secondary | ICD-10-CM | POA: Diagnosis not present

## 2023-04-19 DIAGNOSIS — I10 Essential (primary) hypertension: Secondary | ICD-10-CM | POA: Diagnosis not present

## 2023-04-19 DIAGNOSIS — Z7982 Long term (current) use of aspirin: Secondary | ICD-10-CM | POA: Insufficient documentation

## 2023-04-19 LAB — URINALYSIS, ROUTINE W REFLEX MICROSCOPIC
Bilirubin Urine: NEGATIVE
Glucose, UA: NEGATIVE mg/dL
Ketones, ur: NEGATIVE mg/dL
Leukocytes,Ua: NEGATIVE
Nitrite: NEGATIVE
Protein, ur: 30 mg/dL — AB
Specific Gravity, Urine: 1.025 (ref 1.005–1.030)
pH: 5.5 (ref 5.0–8.0)

## 2023-04-19 LAB — CBC
HCT: 38.2 % (ref 36.0–46.0)
Hemoglobin: 12.4 g/dL (ref 12.0–15.0)
MCH: 29.5 pg (ref 26.0–34.0)
MCHC: 32.5 g/dL (ref 30.0–36.0)
MCV: 90.7 fL (ref 80.0–100.0)
Platelets: 203 10*3/uL (ref 150–400)
RBC: 4.21 MIL/uL (ref 3.87–5.11)
RDW: 12.1 % (ref 11.5–15.5)
WBC: 5.4 10*3/uL (ref 4.0–10.5)
nRBC: 0 % (ref 0.0–0.2)

## 2023-04-19 LAB — COMPREHENSIVE METABOLIC PANEL
ALT: 17 U/L (ref 0–44)
AST: 25 U/L (ref 15–41)
Albumin: 3.5 g/dL (ref 3.5–5.0)
Alkaline Phosphatase: 56 U/L (ref 38–126)
Anion gap: 8 (ref 5–15)
BUN: 15 mg/dL (ref 8–23)
CO2: 22 mmol/L (ref 22–32)
Calcium: 9.2 mg/dL (ref 8.9–10.3)
Chloride: 110 mmol/L (ref 98–111)
Creatinine, Ser: 1.06 mg/dL — ABNORMAL HIGH (ref 0.44–1.00)
GFR, Estimated: 53 mL/min — ABNORMAL LOW (ref >60)
Glucose, Bld: 137 mg/dL — ABNORMAL HIGH (ref 70–99)
Potassium: 3.5 mmol/L (ref 3.5–5.1)
Sodium: 140 mmol/L (ref 135–145)
Total Bilirubin: 0.9 mg/dL (ref 0.3–1.2)
Total Protein: 6.9 g/dL (ref 6.5–8.1)

## 2023-04-19 LAB — URINALYSIS, MICROSCOPIC (REFLEX)

## 2023-04-19 NOTE — ED Triage Notes (Signed)
Patient presents to ED via POV from home with family. Here due to altered mental status. Family reports confusion this morning. Patient "unplugged her TV and was staring out the window". Family expresses concern for the start of dementia. A&O x4 on assessment.

## 2023-04-19 NOTE — Discharge Instructions (Addendum)
I recommend you call your primary care provider to schedule close follow-up.  Neurology will call you to schedule follow-up.  I recommend you do not drive at all, and if you develop any further behavior changes you should return to the ED.   Dementia/Alzheimer's Resources   FREE  1) Alzheimer's Association 24/7 Helpline (1.801-146-1257) General guidance on legal, financial, and care options LimitLaws.hu  2) Senior Resources at [employer] 423 051 7936) Case assistance, Meals on Wheels, Armed forces logistics/support/administrative officer www.senior-resources-[place].org  FEE BASED SERVICES  1) [facility] (229) 147-6625) Senior Care Navigation Services, Legal/Financial Guidance Www.elderlawfirm.com  2) [employer] (323) 516-6087) Senior Care Navigation Services, Legal/Financial Guidance Www.navigateseniorcare.com  Day Care 1) [employer] (251)758-6203) Half Day and Full Day Porgrams Www.well-springsolutions.com  Personal Caregiving 2) [facility] [phone]) 20 hours/week up to 24/7 or live-in Private Pay or Long Term Care insurance accepted [email or web address]

## 2023-04-19 NOTE — Care Management (Signed)
Transition of Care [facility]) - Emergency Department Mini Assessment   Patient Details  Name: Janet Barber MRN: [id number] Date of Birth: 04/19/1941  [facility]) CM/SW Contact:    [name], RN Phone Number: 04/19/2023, 2:37 PM   Clinical Narrative: [name] consult for home health service. This RNCM spoke with patient's daughter [name] who reports her mother resides with her and she is looking for a facility not [facility] services. [name] feels her mother may have dementia as her behaviors have changed. [name] reports on 04/09/23 patient can no longer drive. Patient has a normal routine of walking in the mall on M, W, F however she got lost and was found at [employer]. [name] reports she has a neurology appointment scheduled on 08/03/23, however will work to get in sooner. This RN CM encouraged [name] to see pt's PCP for further guidance as the dementia diagnosed is done in the outpatient setting.   Per chart review patient has not been diagnosed with dementia. [name] reports patient has Medicaid, this RN CM encouraged [name] to call patient's Medicaid SW to get assistance with LTC Medicaid. This RNCM provided resources for ALZ/Dementia to AVS.  No additional TOC needs at this time.     ED Mini Assessment: What brought you to the Emergency Department? : Family reports confusion this morning. Patient "unplugged her TV and was staring out the window".  Barriers to Discharge: Continued Medical Work up  [employer] interventions: provided [employer] of departure: Car  Interventions which prevented an admission or readmission: Other (must enter comment) (Dementia/ ALZ resources)    Patient Designer, industrial/product     Spoke with: [name] Date: 04/19/23,   Contact time: 1431 Contact Phone Number: [phone]    Patient states their goals for this hospitalization and ongoing recovery are:: per patietn's daughter need assistance  w/getting resources CMS Medicare.gov Compare Post Acute Care list provided to:: Patient Represenative (must comment) [name] (daughter)) Choice offered to / list presented to : Adult Children  Admission diagnosis:  CONFUSION Patient Active Problem List   Diagnosis Date Noted   Mixed hyperlipidemia 12/16/2018   Hypothyroidism 12/16/2018   VENOUS INSUFFICIENCY 06/12/2009   Vitamin D deficiency 06/09/2009   DIVERTICULOSIS OF COLON 06/07/2008   COLONIC POLYPS 06/03/2008   HYPERCHOLESTEROLEMIA 06/03/2008   OVERWEIGHT 06/03/2008   ANXIETY 06/03/2008   Essential hypertension 06/03/2008   MITRAL VALVE PROLAPSE 06/03/2008   ALLERGIC RHINITIS 06/03/2008   PCP:  [name], FNP Pharmacy:   [facility] - [place], [place] - [address] [place] [address] Phone: [phone] Fax: [phone]  [id number] - [place], [place] - [address] AT [facility] [address] [place] [address] Phone: [phone] Fax: [phone]  [id number] - [place], [place] - [address] [place]  [address] Phone: [phone] Fax: [phone]

## 2023-04-19 NOTE — ED Provider Notes (Signed)
[place] [facility] Provider Note   CSN: [id number] Arrival date & time: 04/19/23  1210     History  Chief Complaint  Patient presents with   Altered Mental Status    [place] Krupa is a 82 y.o. female.   Altered Mental Status 82 year old female history of anxiety, colon cancer, hypertension, hypercholesterolemia, hypothyroidism presenting for behavior change.  Patient is here with her daughter who she lives with.  Patient's daughter states over the last few months she has had intermittent episodes of confusion concerning for early dementia.  She has found the patient staring out of the window and on a couple occasions has unplugged all of the cords from the TV.  She also got lost a few weeks ago after driving.  She no longer drives.  This morning patient found at home after unplugging all of the cords from the TV.  Patient states she was trying to the TV to work quickly so she took all the cords out.  She remembers these episodes and remembers being confused.  She feels okay right now.  Daughter feels like patient is not currently confused.  Patient has not had any falls, fever, Donnell pain, urinary symptoms, chest pain, difficulty breathing.  No fevers or other recent illness.  No change in medications.  They are trying to get in with neurology as well as her PCP to work on possible placement versus other help at home.     Home Medications Prior to Admission medications   Medication Sig Start Date End Date Taking? Authorizing Provider  aspirin EC 81 MG tablet Take 81 mg by mouth daily. Swallow whole.    [provider]  atorvastatin (LIPITOR) 10 MG tablet Take 1 tablet (10 mg total) by mouth daily. 10/05/22   [name], FNP  carvedilol (COREG) 6.25 MG tablet Take 1 tablet (6.25 mg total) by mouth 2 (two) times daily. 04/09/23   [name], FNP  fluticasone (FLONASE) 50 MCG/ACT nasal spray SHAKE LIQUID AND USE 2 SPRAYS IN [facility]  NOSTRIL DAILY Patient not taking: Reported on 10/05/2022 09/21/21   [name], FNP  hydrochlorothiazide (HYDRODIURIL) 25 MG tablet Take 1 tablet (25 mg total) by mouth daily. 08/01/22   [name], FNP  levothyroxine (SYNTHROID) 50 MCG tablet Take 1 tablet (50 mcg total) by mouth daily before breakfast. 04/09/23   [name], FNP  valsartan (DIOVAN) 160 MG tablet Take 1 tablet (160 mg total) by mouth daily. 04/09/23   [name], FNP      Allergies    Other, Shellfish allergy, and Penicillins    Review of Systems   Review of Systems Review of systems completed and notable as per HPI.  ROS otherwise negative.   Physical Exam Updated Vital Signs BP (!) 155/95   Pulse 73   Temp 98.8 F (37.1 C) (Oral)   Resp 20   SpO2 98%  Physical Exam Vitals and nursing note reviewed.  Constitutional:      General: She is not in acute distress.    Appearance: She is well-developed.  HENT:     Head: Normocephalic and atraumatic.     Nose: Nose normal.     Mouth/Throat:     Mouth: Mucous membranes are moist.     Pharynx: Oropharynx is clear.  Eyes:     Extraocular Movements: Extraocular movements intact.     Conjunctiva/sclera: Conjunctivae normal.     Pupils: Pupils are equal, round, and reactive to light.  Cardiovascular:     Rate and Rhythm: Normal rate and regular rhythm.     Heart sounds: No murmur heard. Pulmonary:     Effort: Pulmonary effort is normal. No respiratory distress.     Breath sounds: Normal breath sounds.  Abdominal:     Palpations: Abdomen is soft.     Tenderness: There is no abdominal tenderness.  Musculoskeletal:        General: No swelling.     Cervical back: Neck supple.     Right lower leg: No edema.     Left lower leg: No edema.  Skin:    General: Skin is warm and dry.     Capillary Refill: Capillary refill takes less than 2 seconds.  Neurological:     General: No focal deficit present.     Mental Status: She is alert and oriented to person,  place, and time. Mental status is at baseline.     Cranial Nerves: No cranial nerve deficit.     Sensory: No sensory deficit.     Motor: No weakness.     Coordination: Coordination normal.     Gait: Gait normal.     Deep Tendon Reflexes: Reflexes normal.     Comments: Awake and alert.  Answering questions appropriate.  Oriented to person, place, time, situation.  Psychiatric:        Mood and Affect: Mood normal.     ED Results / Procedures / Treatments   Labs (all labs ordered are listed, but only abnormal results are displayed) Labs Reviewed  COMPREHENSIVE METABOLIC PANEL - Abnormal; Notable for the following components:      Result Value   Glucose, Bld 137 (*)    Creatinine, Ser 1.06 (*)    GFR, Estimated 53 (*)    All other components within normal limits  URINALYSIS, ROUTINE W REFLEX MICROSCOPIC - Abnormal; Notable for the following components:   Hgb urine dipstick TRACE (*)    Protein, ur 30 (*)    All other components within normal limits  URINALYSIS, MICROSCOPIC (REFLEX) - Abnormal; Notable for the following components:   Bacteria, UA FEW (*)    All other components within normal limits  URINE CULTURE  CBC    EKG EKG Interpretation Date/Time:  Thursday April 19 2023 14:37:31 EDT Ventricular Rate:  66 PR Interval:  181 QRS Duration:  89 QT Interval:  397 QTC Calculation: 416 R Axis:   -34  Text Interpretation: Sinus rhythm Inferior infarct, old Confirmed by Fulton Reek [phone]) on 04/19/2023 2:47:14 PM  Radiology DG Chest 2 View  Result Date: 04/19/2023 CLINICAL DATA:  Altered mental status. EXAM: CHEST - 2 VIEW COMPARISON:  April 10, 2023. FINDINGS: The heart size and mediastinal contours are within normal limits. Both lungs are clear. The visualized skeletal structures are unremarkable. IMPRESSION: No active cardiopulmonary disease. Electronically Signed   By: Lupita Raider M.D.   On: 04/19/2023 14:32   CT Head Wo Contrast  Result Date:  04/19/2023 CLINICAL DATA:  Altered mental status with concern for dementia EXAM: CT HEAD WITHOUT CONTRAST TECHNIQUE: Contiguous axial images were obtained from the base of the skull through the vertex without intravenous contrast. RADIATION DOSE REDUCTION: This exam was performed according to the departmental dose-optimization program which includes automated exposure control, adjustment of the mA and/or kV according to patient size and/or use of iterative reconstruction technique. COMPARISON:  04/10/2023. FINDINGS: Brain: No evidence of acute infarction, hemorrhage, hydrocephalus, or masslike finding. Cerebral volume loss is limited  for age in there is mild chronic small vessel ischemia. Large appearance of extra-axial CSF density without clearly displaced vessels or change, favor this is related to hyperostosis interna rather than hygroma. Vascular: Vertebrobasilar tortuosity. Skull: No focal marrow lesion. Sinuses/Orbits: Negative IMPRESSION: 1. No acute or interval finding. 2. Mild for age chronic small vessel ischemia. Electronically Signed   By: Tiburcio Pea M.D.   On: 04/19/2023 14:30    Procedures Procedures    Medications Ordered in ED Medications - No data to display  ED Course/ Medical Decision Making/ A&P                                 Medical Decision Making Amount and/or Complexity of Data Reviewed Labs: ordered. Radiology: ordered.   Medical Decision Making:   Rosland Pilcher is a 82 y.o. female who presented to the ED today with intermittent confusion and changes in behavior.  Vital signs notable for hypertension.  On exam she is well-appearing, currently is not confused and has normal neurologic exam.  No deficits history of a CVA.  I suspect she has early onset dementia.  She had a recent visit in the ED for similar.  Obtain CT head, lab workup to evaluate for reversible cause.   Patient placed on continuous vitals and telemetry monitoring while in ED which was reviewed  periodically.  Reviewed and confirmed nursing documentation for past medical history, family history, social history.  Initial Study Results:   Laboratory  All laboratory results reviewed.  Labs notable for urinalysis with some bacteriuria but no signs of acute infection.  CBC, CMP unremarkable.  EKG EKG was reviewed independently. Rate, rhythm, axis, intervals all examined and without medically relevant abnormality. ST segments without concerns for elevations.    Radiology:  All images reviewed independently. Agree with radiology report at this time.    Reassessment and Plan:   On reevaluation she remained stable.  No recurrent confusion.  Workup here without reversible cause.  I suspect she is having symptoms related to early dementia.  I discussed this with the [facility] team who called the patient's daughter and provided resources.  I recommended they follow-up closely with neurology and her PCP to help continue to work for resources at home, daughter possible placement eventually as well.  Daughter is comfortable taking patient home.  Instructed patient and her daughter that she should not be driving at all or going outside alone or doing any other dangerous activities.  I given strict return precautions for any other concerns.   Patient's presentation is most consistent with acute complicated illness / injury requiring diagnostic workup.           Final Clinical Impression(s) / ED Diagnoses Final diagnoses:  Confusion    Rx / DC Orders ED Discharge Orders          Ordered    Ambulatory referral to Neurology       Comments: An appointment is requested in approximately: 1 week   04/19/23 1537              Laurence Spates, MD 04/19/23 1555

## 2023-04-19 NOTE — ED Notes (Signed)
Pt off the floor in radiology.

## 2023-04-23 ENCOUNTER — Encounter: Payer: Self-pay | Admitting: Nurse Practitioner

## 2023-04-23 ENCOUNTER — Ambulatory Visit: Payer: No Typology Code available for payment source | Admitting: Nurse Practitioner

## 2023-04-23 ENCOUNTER — Telehealth: Payer: Self-pay

## 2023-04-23 VITALS — BP 140/96 | HR 83 | Temp 98.4°F | Ht 63.0 in | Wt 164.4 lb

## 2023-04-23 DIAGNOSIS — E663 Overweight: Secondary | ICD-10-CM

## 2023-04-23 DIAGNOSIS — E782 Mixed hyperlipidemia: Secondary | ICD-10-CM | POA: Diagnosis not present

## 2023-04-23 DIAGNOSIS — R410 Disorientation, unspecified: Secondary | ICD-10-CM | POA: Diagnosis not present

## 2023-04-23 DIAGNOSIS — I10 Essential (primary) hypertension: Secondary | ICD-10-CM

## 2023-04-23 DIAGNOSIS — E039 Hypothyroidism, unspecified: Secondary | ICD-10-CM | POA: Diagnosis not present

## 2023-04-23 DIAGNOSIS — F03B0 Unspecified dementia, moderate, without behavioral disturbance, psychotic disturbance, mood disturbance, and anxiety: Secondary | ICD-10-CM

## 2023-04-23 DIAGNOSIS — E876 Hypokalemia: Secondary | ICD-10-CM

## 2023-04-23 LAB — BMP8+EGFR
BUN/Creatinine Ratio: 12 (ref 12–28)
BUN: 12 mg/dL (ref 8–27)
CO2: 23 mmol/L (ref 20–29)
Calcium: 9.8 mg/dL (ref 8.7–10.3)
Chloride: 109 mmol/L — ABNORMAL HIGH (ref 96–106)
Creatinine, Ser: 1.01 mg/dL — ABNORMAL HIGH (ref 0.57–1.00)
Glucose: 106 mg/dL — ABNORMAL HIGH (ref 70–99)
Potassium: 3.7 mmol/L (ref 3.5–5.2)
Sodium: 144 mmol/L (ref 134–144)
eGFR: 56 mL/min/{1.73_m2} — ABNORMAL LOW (ref >59)

## 2023-04-23 LAB — LIPID PANEL
Chol/HDL Ratio: 3 ratio (ref 0.0–4.4)
Cholesterol, Total: 174 mg/dL (ref 100–199)
HDL: 58 mg/dL (ref >39)
LDL Chol Calc (NIH): 101 mg/dL — ABNORMAL HIGH (ref 0–99)
Triglycerides: 82 mg/dL (ref 0–149)
VLDL Cholesterol Cal: 15 mg/dL (ref 5–40)

## 2023-04-23 MED ORDER — DONEPEZIL HCL 5 MG PO TABS
5.0000 mg | ORAL_TABLET | Freq: Every day | ORAL | 2 refills | Status: DC
Start: 2023-04-23 — End: 2023-05-17

## 2023-04-23 MED ORDER — LEVOTHYROXINE SODIUM 75 MCG PO TABS
75.0000 ug | ORAL_TABLET | Freq: Every day | ORAL | 1 refills | Status: DC
Start: 2023-04-23 — End: 2023-05-17

## 2023-04-23 NOTE — Transitions of Care (Post Inpatient/ED Visit) (Signed)
   04/23/2023  Name: Janet Barber MRN: [id number] DOB: 1941-01-05  Today's TOC FU Call Status: Today's TOC FU Call Status:: Successful TOC FU Call Completed TOC FU Call Complete Date: 04/23/23  Transition Care Management Follow-up Telephone Call Date of Discharge: 04/19/23 Discharge Facility: [facility] Type of Discharge: Inpatient Admission Primary Inpatient Discharge Diagnosis:: confusion How have you been since you were released from the hospital?: Same Any questions or concerns?: No  Items Reviewed: Did you receive and understand the discharge instructions provided?: Yes Medications obtained,verified, and reconciled?: Yes (Medications Reviewed) Any new allergies since your discharge?: No Dietary orders reviewed?: No Do you have support at home?: No  Medications Reviewed Today: Medications Reviewed Today     Reviewed by Marlyn Corporal, CMA (Certified Medical Assistant) on 04/23/23 at [phone]  Med List Status: <None>   Medication Order Taking? Sig Documenting Provider Last Dose Status Informant  aspirin EC 81 MG tablet [id number] Yes Take 81 mg by mouth daily. Swallow whole. [provider] Taking Active Self  atorvastatin (LIPITOR) 10 MG tablet [id number] Yes Take 1 tablet (10 mg total) by mouth daily. [name], FNP Taking Active   carvedilol (COREG) 6.25 MG tablet [id number] No Take 1 tablet (6.25 mg total) by mouth 2 (two) times daily.  Patient not taking: Reported on 04/23/2023   [name], FNP Not Taking Active   fluticasone [facility]) 50 MCG/ACT nasal spray [id number] No SHAKE LIQUID AND USE 2 SPRAYS IN [facility] NOSTRIL DAILY  Patient not taking: Reported on 10/05/2022   [name], FNP Not Taking Active   hydrochlorothiazide (HYDRODIURIL) 25 MG tablet [id number] Yes Take 1 tablet (25 mg total) by mouth daily. [name], FNP Taking Active   levothyroxine (SYNTHROID) 50 MCG tablet [id number] Yes Take 1 tablet (50 mcg total) by mouth daily before  breakfast. [name], FNP Taking Active   valsartan (DIOVAN) 160 MG tablet [id number] Yes Take 1 tablet (160 mg total) by mouth daily. [name], FNP Taking Active             Home Care and Equipment/Supplies: Were Home Health Services Ordered?: No Any new equipment or medical supplies ordered?: No  Functional Questionnaire: Do you need assistance with bathing/showering or dressing?: No Do you need assistance with meal preparation?: No Do you need assistance with eating?: No Do you have difficulty maintaining continence: No Do you need assistance with getting out of bed/getting out of a chair/moving?: No Do you have difficulty managing or taking your medications?: No  Follow up appointments reviewed: PCP Follow-up appointment confirmed?: Yes MD Provider Line [phone] Given: Yes Date of PCP follow-up appointment?: 04/23/23 [facility] Follow-up appointment confirmed?: No Do you need transportation to your follow-up appointment?: No Do you understand care options if your condition(s) worsen?: Yes-patient verbalized understanding    SIGNATURE Lisabeth Devoid, CMA

## 2023-04-23 NOTE — Progress Notes (Addendum)
[name], CMA,acting as a Neurosurgeon for [name], FNP.,have documented all relevant documentation on the behalf of [name], FNP,as directed by  [name], FNP while in the presence of [name], FNP.  Subjective:  Patient ID: Janet Barber , female    DOB: October 02, 1940 , 82 y.o.   MRN: [id number]  Chief Complaint  Patient presents with   Hypertension   Hyperlipidemia    1    HPI  Patient presents today for a BP, Chol, and thyroid follow up. Patient reports compliance with medications. Patient denies any headaches, SOB, or chest pain. Patient has went to the hospital 2 times once on 04/09/2023 and 04/19/2023 for confusion. Patients daughter reports she has had a lot of changes in her memory since 04/09/2023. Patient is no longer able to fix her own meals, drive, or take her medications. Her daughter will make a sandwich with  chips for lunch. She is able to fix her own breakfast. She has taken dishwashing liquid away. Her daughter is hiding things more. Her daughter works 2nd shift in [employer] about 2pm - 1am. There is no one is present with her at that time. Her daughter discussed with patient about being in a home around others. She is not driving at this time. She is also forgetting her access to her telephone and fills up with messages.   Her daughter is looking for long term options. Her daughter  Patient is here today with her daughter [name] who she lives with.   BP Readings from Last 3 Encounters: 04/23/23 : (!) 140/100 04/19/23 : (!) 155/95 04/10/23 : (!) 176/114      Past Medical History:  Diagnosis Date   Allergic rhinitis    Anxiety    Colon cancer (HCC)    Diverticulosis of colon    History of hemorrhoids    Hx of colonic polyps    Hypercholesterolemia    Hypertension    Hypothyroidism    Lumbar back pain    Mitral valve prolapse    Overweight(278.02)    Venous insufficiency    Vitamin D deficiency      Family History  Problem  Relation Age of Onset   Hypertension Maternal Grandmother      Current Outpatient Medications:    aspirin EC 81 MG tablet, Take 81 mg by mouth daily. Swallow whole., Disp: , Rfl:    atorvastatin (LIPITOR) 10 MG tablet, Take 1 tablet (10 mg total) by mouth daily., Disp: 90 tablet, Rfl: 1   donepezil (ARICEPT) 5 MG tablet, Take 1 tablet (5 mg total) by mouth at bedtime., Disp: 30 tablet, Rfl: 2   hydrochlorothiazide (HYDRODIURIL) 25 MG tablet, Take 1 tablet (25 mg total) by mouth daily., Disp: 90 tablet, Rfl: 1   valsartan (DIOVAN) 160 MG tablet, Take 1 tablet (160 mg total) by mouth daily., Disp: 90 tablet, Rfl: 1   carvedilol (COREG) 6.25 MG tablet, Take 1 tablet (6.25 mg total) by mouth 2 (two) times daily. (Patient not taking: Reported on 04/23/2023), Disp: 180 tablet, Rfl: 1   fluticasone (FLONASE) 50 MCG/ACT nasal spray, SHAKE LIQUID AND USE 2 SPRAYS IN EACH NOSTRIL DAILY (Patient not taking: Reported on 10/05/2022), Disp: 48 g, Rfl: 2   levothyroxine (SYNTHROID) 75 MCG tablet, Take 1 tablet (75 mcg total) by mouth daily before breakfast., Disp: 90 tablet, Rfl: 1   Allergies  Allergen Reactions   Other Shortness Of Breath   Shellfish Allergy Itching   Penicillins  REACTION: unsure of reaction---years ago     Review of Systems  Constitutional: Negative.   Respiratory: Negative.    Cardiovascular: Negative.   Neurological: Negative.   Psychiatric/Behavioral: Negative.       Today's Vitals   04/23/23 0943 04/23/23 1253  BP: (!) 140/100 (!) 140/96  Pulse: 83   Temp: 98.4 F (36.9 C)   TempSrc: Oral   Weight: 164 lb 6.4 oz (74.6 kg)   Height: 5\' 3"  (1.6 m)   PainSc: 0-No pain    Body mass index is 29.12 kg/m.  Wt Readings from Last 3 Encounters:  04/23/23 164 lb 6.4 oz (74.6 kg)  04/09/23 169 lb 12.1 oz (77 kg)  10/05/22 170 lb 9.6 oz (77.4 kg)     Objective:  Physical Exam Vitals reviewed.  Constitutional:      General: She is not in acute distress.     Appearance: Normal appearance.  Cardiovascular:     Rate and Rhythm: Normal rate and regular rhythm.     Pulses: Normal pulses.     Heart sounds: Normal heart sounds. No murmur heard. Pulmonary:     Effort: Pulmonary effort is normal. No respiratory distress.     Breath sounds: Normal breath sounds. No wheezing.  Skin:    General: Skin is warm and dry.     Capillary Refill: Capillary refill takes less than 2 seconds.  Neurological:     General: No focal deficit present.     Mental Status: She is alert and oriented to person, place, and time.     Cranial Nerves: No cranial nerve deficit.     Motor: No weakness.         Assessment And Plan:  Mixed hyperlipidemia Assessment & Plan: Cholesterol levels are slightly elevated.  Will recheck today.  Continue statin, tolerating well  Orders: -     Lipid panel  Essential hypertension Assessment & Plan: Blood pressure recheck Barber to elevation improved slightly with repeat.  Encouraged to take medications as directed.  Orders: -     Basic metabolic panel -     BMP8+eGFR  Hypothyroidism, unspecified type Assessment & Plan: Thyroid levels were slightly low we will increase levothyroxine to 75 mcgs return in 6 weeks for repeat labs  Orders: -     Levothyroxine Sodium; Take 1 tablet (75 mcg total) by mouth daily before breakfast.  Dispense: 90 tablet; Refill: 1  Overweight with body mass index (BMI) 25.0-29.9  Moderate dementia without behavioral disturbance, psychotic disturbance, mood disturbance, or anxiety, unspecified dementia type [facility]) Assessment & Plan: Long discussion with daughter about her changes in her memory.  Will start her on donepezil 5 mg at bedtime return in 6 weeks for follow-up.  Will refer to neurology for official diagnosis of Alzheimer's.  Versus dementia.  Will refer to social work to help with any type of resources as well as referral to dementia program for care.  Explained to daughter patient may not be  necessarily safe at alone to consider someone to stay with her at night or at least get cameras in the home.  Answered all questions.  Orders: -     Donepezil HCl; Take 1 tablet (5 mg total) by mouth at bedtime.  Dispense: 30 tablet; Refill: 2 -     Ambulatory referral to Hospice  Hypokalemia Assessment & Plan: Will recheck potassium was slightly low at hospital.  Orders: -     BMP8+eGFR  Confusion Assessment & Plan: Seen in ER for  confusion. TCM Performed. A member of the clinical team spoke with the patient upon dischare. Discharge summary was reviewed in full detail during the visit. Meds reconciled and compared to discharge meds. Medication list is updated and reviewed with the patient.  Greater than 50% face to face time was spent in counseling an coordination of care.  All questions were answered to the satisfaction of the patient.        No follow-ups on file.  Patient was given opportunity to ask questions. Patient verbalized understanding of the plan and was able to repeat key elements of the plan. All questions were answered to their satisfaction.    Jeanell Sparrow, FNP, have reviewed all documentation for this visit. The documentation on 05/14/23 for the exam, diagnosis, procedures, and orders are all accurate and complete.   IF YOU HAVE BEEN REFERRED TO A SPECIALIST, IT MAY TAKE 1-2 WEEKS TO SCHEDULE/PROCESS THE REFERRAL. IF YOU HAVE NOT HEARD FROM US/SPECIALIST IN TWO WEEKS, PLEASE GIVE [place] A CALL AT [phone].

## 2023-04-23 NOTE — Patient Instructions (Addendum)
[employer] -  [phone], Monday - Friday, 8 a.m. - 8 p.m  [employer] 1 [phone]  [employer] [phone]  Return to office in 6 weeks for thyroid level lab visit ONLY  I recommend a book called the 36 hour Day you can purchase  on [employer].

## 2023-04-25 ENCOUNTER — Telehealth: Payer: Self-pay | Admitting: *Deleted

## 2023-04-25 DIAGNOSIS — E876 Hypokalemia: Secondary | ICD-10-CM | POA: Insufficient documentation

## 2023-04-25 DIAGNOSIS — F03B0 Unspecified dementia, moderate, without behavioral disturbance, psychotic disturbance, mood disturbance, and anxiety: Secondary | ICD-10-CM | POA: Insufficient documentation

## 2023-04-25 DIAGNOSIS — E663 Overweight: Secondary | ICD-10-CM | POA: Insufficient documentation

## 2023-04-25 NOTE — Assessment & Plan Note (Signed)
Will recheck potassium was slightly low at hospital.

## 2023-04-25 NOTE — Assessment & Plan Note (Signed)
Thyroid levels were slightly low we will increase levothyroxine to 75 mcgs return in 6 weeks for repeat labs

## 2023-04-25 NOTE — Progress Notes (Unsigned)
  Care Coordination  Outreach Note  04/25/2023 Name: Janet Barber MRN: [id number] DOB: 10/07/1940   Care Coordination Outreach Attempts: An unsuccessful telephone outreach was attempted today to offer the patient information about available care coordination services.  Follow Up Plan:  Additional outreach attempts will be made to offer the patient care coordination information and services.   Encounter Outcome:  No Answer  Christie Nottingham  Care Coordination Care Guide  Direct Dial: [phone]

## 2023-04-25 NOTE — Assessment & Plan Note (Signed)
Long discussion with daughter about her changes in her memory.  Will start her on donepezil 5 mg at bedtime return in 6 weeks for follow-up.  Will refer to neurology for official diagnosis of Alzheimer's.  Versus dementia.  Will refer to social work to help with any type of resources as well as referral to dementia program for care.  Explained to daughter patient may not be necessarily safe at alone to consider someone to stay with her at night or at least get cameras in the home.  Answered all questions.

## 2023-04-25 NOTE — Assessment & Plan Note (Signed)
Cholesterol levels are slightly elevated.  Will recheck today.  Continue statin, tolerating well

## 2023-04-25 NOTE — Assessment & Plan Note (Addendum)
Blood pressure recheck due to elevation improved slightly with repeat.  Encouraged to take medications as directed.

## 2023-04-26 ENCOUNTER — Ambulatory Visit: Payer: No Typology Code available for payment source | Admitting: Physician Assistant

## 2023-04-26 NOTE — Progress Notes (Signed)
  Care Coordination  Outreach Note  04/26/2023 Name: Morgane Maffett MRN: [id number] DOB: 06-Feb-1941   Care Coordination Outreach Attempts: A second unsuccessful outreach was attempted today to offer the patient with information about available care coordination services.  Follow Up Plan:  Additional outreach attempts will be made to offer the patient care coordination information and services.   Encounter Outcome:  No Answer   Gwenevere Ghazi  Care Coordination Care Guide  Direct Dial: [phone]

## 2023-04-26 NOTE — Progress Notes (Signed)
  Care Coordination   Note   04/26/2023 Name: Janet Barber MRN: [id number] DOB: 1941-08-02  [name] is a 82 y.o. year old female who sees [name], FNP for primary care. I reached out to [name] by phone today to offer care coordination services.  Ms. Oteri was given information about Care Coordination services today including:   The Care Coordination services include support from the care team which includes your Nurse Coordinator, Clinical Social Worker, or Pharmacist.  The Care Coordination team is here to help remove barriers to the health concerns and goals most important to you. Care Coordination services are voluntary, and the patient may decline or stop services at any time by request to their care team member.   Care Coordination Consent Status: Patient daughter Numa Rhoden agreed to services and verbal consent obtained.   Follow up plan:  Telephone appointment with care coordination team member scheduled for:  05/04/23  Encounter Outcome:  patient scheduled  [facility] Coordination Care Guide  Direct Dial: [phone]

## 2023-05-04 ENCOUNTER — Ambulatory Visit: Payer: Self-pay

## 2023-05-04 NOTE — Patient Instructions (Signed)
Visit Information  Thank you for taking time to visit with me today. Please don't hesitate to contact me if I can be of assistance to you.   Following are the goals we discussed today:  - Engage with [facility] to enroll in the GUIDE Program - Contact your primary care provider as needed   If you are experiencing a Mental Health or Behavioral Health Crisis or need someone to talk to, please call 1-800-273-TALK (toll free, 24 hour hotline) go to [facility] [address], [place] [phone]) call 911  The patient verbalized understanding of instructions, educational materials, and care plan provided today and DECLINED offer to receive copy of patient instructions, educational materials, and care plan.   No further follow up required: Please contact me as needed.  Bevelyn Ngo, BSW, CDP Social Worker, Certified Dementia Practitioner [facility] Care Management  Care Coordination [phone]

## 2023-05-04 NOTE — Patient Outreach (Signed)
  Care Coordination   Initial Visit Note   05/04/2023 Name: Janet Barber MRN: [id number] DOB: [birth year]  Janet Barber is a 82 y.o. year old female who sees [name], FNP for primary care. I  spoke with the patients daughter and primary caregiver [name] by phone to follow up on care coordination needs.  What matters to the patients health and wellness today?  Identify resources to remain in the home    Goals Addressed             This Visit's Progress    COMPLETED: Care Coordination Activities       Care Coordination Interventions: Discussed the patient is no longer driving since becoming lost last month; daughter has taken keys away so patient cannot access a vehicle Reviewed the daughter works second shift so patient is alone in the home during working hours. The patient wakes up while her daughter sleeps and prepares her own breakfast and takes medications. Daughter does not report any concern with medication management Determined patients daughter prepares the patient lunch and dinner each day before she leaves for work. The patient eats a sandwich for lunch and warms up dinner in the microwave A neighbor will begin checking on the patient periodically while the patients daughter is at work Assessed interest in placement - patients daughter declines interest at this time. She is encouraged to contact SW as needed if placement is desired Performed chart review to note recent referral to [facility] for GUIDE program. Daughter indicates she did receive a voice message but it was for Hospice services so she did not return the call Education provided to the daughter about what the GUIDE program offers Collaboration with Duwayne Heck from the GUIDE team who confirms she does have the referral; Duwayne Heck will outreach the patients daughter again today to discuss enrollment into the program Collaboration with patients primary care provider to advise of interventions and plan for  patient to work with GUIDE for ongoing resource navigation         SDOH assessments and interventions completed:  No     Care Coordination Interventions:  Yes, provided   Interventions Today    Flowsheet Row Most Recent Value  Chronic Disease   Chronic disease during today's visit Other  [Dementia]  General Interventions   General Interventions Discussed/Reviewed General Interventions Discussed, Programmer, applications, Communication with  [Follow up with GUIDE program]  Communication with PCP/Specialists  [advised PCP of desire to keep patient in the home with a neighbor checking on her periodically while daughter is at work. Will engage with GUIDE program]  Nutrition Interventions   Nutrition Discussed/Reviewed Nutrition Discussed  [Pt prepares own breakfast while dtr sleeps. Dtr prepares lunch and dinner for pt prior to leaving for work]  Safety Interventions   Safety Discussed/Reviewed Safety Discussed, Home Safety  Home Safety --  Enos Fling to check on patient while daughter is at work,  GUIDE referral for ongoing resource navigation]        Follow up plan: No further intervention required. Referral made to GUIDE Program    Encounter Outcome:  Pt. Visit Completed   Bevelyn Ngo, BSW, CDP Social Worker, Certified Dementia Practitioner [facility] Care Management  Care Coordination [phone]

## 2023-05-14 ENCOUNTER — Other Ambulatory Visit: Payer: Self-pay

## 2023-05-14 DIAGNOSIS — R410 Disorientation, unspecified: Secondary | ICD-10-CM | POA: Insufficient documentation

## 2023-05-14 NOTE — Assessment & Plan Note (Signed)
Seen in ER for confusion. TCM Performed. A member of the clinical team spoke with the patient upon dischare. Discharge summary was reviewed in full detail during the visit. Meds reconciled and compared to discharge meds. Medication list is updated and reviewed with the patient.  Greater than 50% face to face time was spent in counseling an coordination of care.  All questions were answered to the satisfaction of the patient.

## 2023-05-17 ENCOUNTER — Other Ambulatory Visit: Payer: Self-pay | Admitting: Nurse Practitioner

## 2023-05-17 ENCOUNTER — Other Ambulatory Visit: Payer: No Typology Code available for payment source

## 2023-05-17 ENCOUNTER — Ambulatory Visit: Payer: No Typology Code available for payment source

## 2023-05-17 ENCOUNTER — Other Ambulatory Visit: Payer: Self-pay

## 2023-05-17 VITALS — BP 150/98 | HR 67 | Temp 98.0°F | Ht 64.0 in | Wt 161.4 lb

## 2023-05-17 DIAGNOSIS — F03B0 Unspecified dementia, moderate, without behavioral disturbance, psychotic disturbance, mood disturbance, and anxiety: Secondary | ICD-10-CM

## 2023-05-17 DIAGNOSIS — I119 Hypertensive heart disease without heart failure: Secondary | ICD-10-CM

## 2023-05-17 DIAGNOSIS — Z0000 Encounter for general adult medical examination without abnormal findings: Secondary | ICD-10-CM

## 2023-05-17 DIAGNOSIS — R410 Disorientation, unspecified: Secondary | ICD-10-CM

## 2023-05-17 DIAGNOSIS — E039 Hypothyroidism, unspecified: Secondary | ICD-10-CM

## 2023-05-17 DIAGNOSIS — E782 Mixed hyperlipidemia: Secondary | ICD-10-CM

## 2023-05-17 MED ORDER — HYDROCHLOROTHIAZIDE 25 MG PO TABS
25.0000 mg | ORAL_TABLET | Freq: Every day | ORAL | 1 refills | Status: DC
Start: 2023-05-17 — End: 2023-07-03

## 2023-05-17 MED ORDER — CARVEDILOL 6.25 MG PO TABS
6.2500 mg | ORAL_TABLET | Freq: Two times a day (BID) | ORAL | 1 refills | Status: DC
Start: 2023-05-17 — End: 2023-11-14

## 2023-05-17 MED ORDER — DONEPEZIL HCL 5 MG PO TABS
5.0000 mg | ORAL_TABLET | Freq: Every day | ORAL | 2 refills | Status: DC
Start: 2023-05-17 — End: 2023-07-03

## 2023-05-17 MED ORDER — LEVOTHYROXINE SODIUM 75 MCG PO TABS
75.0000 ug | ORAL_TABLET | Freq: Every day | ORAL | 1 refills | Status: DC
Start: 2023-05-17 — End: 2023-11-14

## 2023-05-17 MED ORDER — VALSARTAN 160 MG PO TABS
160.0000 mg | ORAL_TABLET | Freq: Every day | ORAL | 1 refills | Status: DC
Start: 2023-05-17 — End: 2023-07-03

## 2023-05-17 MED ORDER — ATORVASTATIN CALCIUM 10 MG PO TABS
10.0000 mg | ORAL_TABLET | Freq: Every day | ORAL | 1 refills | Status: DC
Start: 2023-05-17 — End: 2023-11-14

## 2023-05-17 NOTE — Progress Notes (Signed)
Subjective:   Janet Barber is a 82 y.o. female who presents for Medicare Annual (Subsequent) preventive examination.  Visit Complete: In person  .  Review of Systems     Cardiac Risk Factors include: advanced age (>62men, >61 women)     Objective:    Today's Vitals   05/17/23 1015 05/17/23 1033  BP: (!) 160/98 (!) 150/98  Pulse: 67   Temp: 98 F (36.7 C)   TempSrc: Oral   SpO2: 96%   Weight: 161 lb 6.4 oz (73.2 kg)   Height: 5\' 4"  (1.626 m)    Body mass index is 27.7 kg/m.     05/17/2023   10:23 AM 03/23/2022    3:05 PM 03/10/2021    3:32 PM 01/22/2020    2:41 PM 06/19/2019    8:51 AM 08/13/2018   12:01 PM 04/02/2018    6:34 AM  Advanced Directives  Does Patient Have a Medical Advance Directive? No No No No No No No  Would patient like information on creating a medical advance directive? No - Patient declined   No - Patient declined Yes (MAU/Ambulatory/Procedural Areas - Information given) No - Patient declined No - Patient declined    Current Medications (verified) Outpatient Encounter Medications as of 05/17/2023  Medication Sig   aspirin EC 81 MG tablet Take 81 mg by mouth daily. Swallow whole.   atorvastatin (LIPITOR) 10 MG tablet Take 1 tablet (10 mg total) by mouth daily.   carvedilol (COREG) 6.25 MG tablet Take 1 tablet (6.25 mg total) by mouth 2 (two) times daily.   donepezil (ARICEPT) 5 MG tablet Take 1 tablet (5 mg total) by mouth at bedtime.   hydrochlorothiazide (HYDRODIURIL) 25 MG tablet Take 1 tablet (25 mg total) by mouth daily.   levothyroxine (SYNTHROID) 75 MCG tablet Take 1 tablet (75 mcg total) by mouth daily before breakfast.   valsartan (DIOVAN) 160 MG tablet Take 1 tablet (160 mg total) by mouth daily.   fluticasone (FLONASE) 50 MCG/ACT nasal spray SHAKE LIQUID AND USE 2 SPRAYS IN EACH NOSTRIL DAILY (Patient not taking: Reported on 10/05/2022)   No facility-administered encounter medications on file as of 05/17/2023.    Allergies  (verified) Other, Shellfish allergy, and Penicillins   History: Past Medical History:  Diagnosis Date   Allergic rhinitis    Anxiety    Colon cancer (HCC)    Diverticulosis of colon    History of hemorrhoids    Hx of colonic polyps    Hypercholesterolemia    Hypertension    Hypothyroidism    Lumbar back pain    Mitral valve prolapse    Overweight(278.02)    Venous insufficiency    Vitamin D deficiency    Past Surgical History:  Procedure Laterality Date   ABDOMINAL HYSTERECTOMY  1974   benign breast biopsy  2006   BREAST REDUCTION SURGERY Bilateral 04/02/2018   Procedure: MAMMARY REDUCTION  (BREAST);  Surgeon: Glenna Fellows, MD;  Location: [facility];  Service: Plastics;  Laterality: Bilateral;   MASS EXCISION Left 04/02/2018   Procedure: EXCISION LEFT BREAST MASS;  Surgeon: Glenna Fellows, MD;  Location: [facility];  Service: Plastics;  Laterality: Left;   UMBILICAL HERNIA REPAIR  1988   Family History  Problem Relation Age of Onset   Hypertension Maternal Grandmother    Social History   Socioeconomic History   Marital status: Widowed    Spouse name: Not on file   Number of children: 2  Years of education: Not on file   Highest education level: Not on file  Occupational History   Occupation: retired  Tobacco Use   Smoking status: Former    Current packs/day: 0.00    Types: Cigarettes    Quit date: 09/18/1986    Years since quitting: 36.6   Smokeless tobacco: Never  Vaping Use   Vaping status: Never Used  Substance and Sexual Activity   Alcohol use: No   Drug use: No   Sexual activity: Not Currently  Other Topics Concern   Not on file  Social History Narrative   Not on file   Social Determinants of Health   Financial Resource Strain: Low Risk  (05/17/2023)   Overall Financial Resource Strain (CARDIA)    Difficulty of Paying Living Expenses: Not hard at all  Food Insecurity: No Food Insecurity (05/17/2023)   Hunger  Vital Sign    Worried About Running Out of Food in the Last Year: Never true    Ran Out of Food in the Last Year: Never true  Transportation Needs: No Transportation Needs (05/17/2023)   PRAPARE - Administrator, Civil Service (Medical): No    Lack of Transportation (Non-Medical): No  Physical Activity: Inactive (05/17/2023)   Exercise Vital Sign    Days of Exercise per Week: 0 days    Minutes of Exercise per Session: 0 min  Stress: No Stress Concern Present (05/17/2023)   [employer] of Occupational Health - Occupational Stress Questionnaire    Feeling of Stress : Not at all  Social Connections: Moderately Isolated (05/17/2023)   Social Connection and Isolation Panel [NHANES]    Frequency of Communication with Friends and Family: Three times a week    Frequency of Social Gatherings with Friends and Family: Once a week    Attends Religious Services: More than 4 times per year    Active Member of [employer] or Organizations: No    Attends Banker Meetings: Never    Marital Status: Widowed    Tobacco Counseling Counseling given: Not Answered   Clinical Intake:  Pre-visit preparation completed: Yes  Pain : No/denies pain     Nutritional Status: BMI 25 -29 Overweight Nutritional Risks: None Diabetes: No  How often do you need to have someone help you when you read instructions, pamphlets, or other written materials from your doctor or pharmacy?: 1 - Never  Interpreter Needed?: No  Information entered by :: NAllen LPN   Activities of Daily Living    05/17/2023   10:17 AM  In your present state of health, do you have any difficulty performing the following activities:  Hearing? 0  Vision? 0  Difficulty concentrating or making decisions? 1  Comment dementia  Walking or climbing stairs? 0  Dressing or bathing? 0  Doing errands, shopping? 0  Preparing Food and eating ? N  Using the Toilet? N  In the past six months, have you accidently leaked  urine? N  Do you have problems with loss of bowel control? N  Managing your Medications? N  Managing your Finances? N  Housekeeping or managing your Housekeeping? N    Patient Care Team: [name], FNP as PCP - General (General Practice)  Indicate any recent Medical Services you may have received from other than [facility] providers in the past year (date may be approximate).     Assessment:   This is a routine wellness examination for Janet Barber.  Hearing/Vision screen Hearing Screening - Comments:: Denies hearing  issues Vision Screening - Comments:: No regular eye exams  Dietary issues and exercise activities discussed:     Goals Addressed             This Visit's Progress    Patient Stated       05/17/2023, denies goals at this time       Depression Screen    05/17/2023   10:25 AM 04/23/2023    9:39 AM 10/05/2022   11:58 AM 03/29/2022   10:28 AM 03/23/2022    3:05 PM 03/10/2021    3:34 PM 01/22/2020    2:42 PM  PHQ 2/9 Scores  PHQ - 2 Score 0 0 0 0 0 0 0  PHQ- 9 Score 2 0     0    Fall Risk    05/17/2023   10:24 AM 04/23/2023    9:39 AM 10/05/2022   11:57 AM 08/01/2022    9:45 AM 03/29/2022   10:28 AM  Fall Risk   Falls in the past year? 0 0 0 0   Number falls in past yr: 0 0 0 0 0  Injury with Fall? 0 0 0 0 0  Risk for fall due to : Medication side effect No Fall Risks No Fall Risks No Fall Risks   Follow up Falls prevention discussed;Falls evaluation completed Falls evaluation completed Falls evaluation completed Falls evaluation completed Falls evaluation completed    MEDICARE RISK AT HOME: Medicare Risk at Home Any stairs in or around the home?: Yes If so, are there any without handrails?: No Home free of loose throw rugs in walkways, pet beds, electrical cords, etc?: Yes Adequate lighting in your home to reduce risk of falls?: Yes Life alert?: No Use of a cane, walker or w/c?: No Grab bars in the bathroom?: No Shower chair or bench in shower?: No Elevated  toilet seat or a handicapped toilet?: No  TIMED UP AND GO:  Was the test performed?  Yes  Length of time to ambulate 10 feet: 5 sec Gait steady and fast without use of assistive device    Cognitive Function:  6 CIT not administered due to diagnosis of dementia        04/23/2023    9:40 AM 03/23/2022    3:07 PM 03/10/2021    3:37 PM 01/22/2020    2:43 PM 06/19/2019    8:54 AM  6CIT Screen  What Year? 0 points 0 points 0 points 0 points 0 points  What month? 0 points 0 points 0 points 0 points 0 points  What time? 3 points 0 points 0 points 0 points 0 points  Count back from 20 0 points 0 points 0 points 0 points 0 points  Months in reverse 4 points 0 points 0 points 0 points 0 points  Repeat phrase 10 points 0 points 10 points 2 points 0 points  Total Score 17 points 0 points 10 points 2 points 0 points    Immunizations Immunization History  Administered Date(s) Administered   Fluad Quad(high Dose 65+) 09/01/2020, 10/05/2022   Influenza Whole 06/03/2008, 07/08/2009   Influenza-Unspecified 07/18/2011, 06/18/2018, 06/16/2019   Moderna Sars-Covid-2 Vaccination 10/14/2019, 11/11/2019   PFIZER(Purple Top)SARS-COV-2 Vaccination 08/02/2020, 01/04/2021   Pfizer Covid-19 Vaccine Bivalent Booster 88yrs & up 06/17/2021   Pneumococcal Conjugate-13 06/21/2019   Pneumococcal Polysaccharide-23 06/09/2009   Pneumococcal-Unspecified 06/21/2019   Tdap 05/06/2013   Zoster Recombinant(Shingrix) 06/21/2019, 06/21/2019, 08/29/2019    TDAP status: Up to date  Flu Vaccine status: Due, Education  has been provided regarding the importance of this vaccine. Advised may receive this vaccine at local pharmacy or Health Dept. Aware to provide a copy of the vaccination record if obtained from local pharmacy or Health Dept. Verbalized acceptance and understanding.  Pneumococcal vaccine status: Up to date  Covid-19 vaccine status: Information provided on how to obtain vaccines.   Qualifies for Shingles  Vaccine? Yes   Zostavax completed Yes   Shingrix Completed?: Yes  Screening Tests Health Maintenance  Topic Date Due   DEXA SCAN  Never done   COVID-19 Vaccine (6 - 2023-24 season) 05/19/2022   DTaP/Tdap/Td (2 - Td or Tdap) 05/07/2023   INFLUENZA VACCINE  04/19/2023   Medicare Annual Wellness (AWV)  05/16/2024   Pneumonia Vaccine 40+ Years old  Completed   Zoster Vaccines- Shingrix  Completed   HPV VACCINES  Aged Out   Hepatitis C Screening  Discontinued    Health Maintenance  Health Maintenance Due  Topic Date Due   DEXA SCAN  Never done   COVID-19 Vaccine (6 - 2023-24 season) 05/19/2022   DTaP/Tdap/Td (2 - Td or Tdap) 05/07/2023   INFLUENZA VACCINE  04/19/2023    Colorectal cancer screening: No longer required.   Mammogram status: No longer required due to age.  Bone Density status: n/a  Lung Cancer Screening: (Low Dose CT Chest recommended if Age 7-80 years, 20 pack-year currently smoking OR have quit w/in 15years.) does not qualify.   Lung Cancer Screening Referral: no  Additional Screening:  Hepatitis C Screening: does not qualify;   Vision Screening: Recommended annual ophthalmology exams for early detection of glaucoma and other disorders of the eye. Is the patient up to date with their annual eye exam?  No  Who is the provider or what is the name of the office in which the patient attends annual eye exams? none If pt is not established with a provider, would they like to be referred to a provider to establish care? No .   Dental Screening: Recommended annual dental exams for proper oral hygiene  Diabetic Foot Exam: n/a  Community Resource Referral / Chronic Care Management: CRR required this visit?  No   CCM required this visit?  No     Plan:     I have personally reviewed and noted the following in the patient's chart:   Medical and social history Use of alcohol, tobacco or illicit drugs  Current medications and supplements including opioid  prescriptions. Patient is not currently taking opioid prescriptions. Functional ability and status Nutritional status Physical activity Advanced directives List of other physicians Hospitalizations, surgeries, and ER visits in previous 12 months Vitals Screenings to include cognitive, depression, and falls Referrals and appointments  In addition, I have reviewed and discussed with patient certain preventive protocols, quality metrics, and best practice recommendations. A written personalized care plan for preventive services as well as general preventive health recommendations were provided to patient.     Barb Merino, LPN   [id number]   After Visit Summary: in person  Nurse Notes: none

## 2023-05-17 NOTE — Patient Instructions (Addendum)
Janet Barber , Thank you for taking time to come for your Medicare Wellness Visit. I appreciate your ongoing commitment to your health goals. Please review the following plan we discussed and let me know if I can assist you in the future.   Referrals/Orders/Follow-Ups/Clinician Recommendations: none  This is a list of the screening recommended for you and due dates:  Health Maintenance  Topic Date Due   DEXA scan (bone density measurement)  Never done   COVID-19 Vaccine (6 - 2023-24 season) 05/19/2022   DTaP/Tdap/Td vaccine (2 - Td or Tdap) 05/07/2023   Flu Shot  04/19/2023   Medicare Annual Wellness Visit  05/16/2024   Pneumonia Vaccine  Completed   Zoster (Shingles) Vaccine  Completed   HPV Vaccine  Aged Out   Hepatitis C Screening  Discontinued    Advanced directives: (Declined) Advance directive discussed with you today. Even though you declined this today, please call our office should you change your mind, and we can give you the proper paperwork for you to fill out.  Next Medicare Annual Wellness Visit scheduled for next year: No  Insert Preventive Care attachment Insert FALL PREVENTION attachment if needed

## 2023-06-23 ENCOUNTER — Ambulatory Visit
Admission: RE | Admit: 2023-06-23 | Discharge: 2023-06-23 | Disposition: A | Discharge status: Home / Self Care | Payer: No Typology Code available for payment source | Source: Ambulatory Visit | Attending: Nurse Practitioner | Admitting: Nurse Practitioner

## 2023-06-23 DIAGNOSIS — R410 Disorientation, unspecified: Secondary | ICD-10-CM

## 2023-07-03 ENCOUNTER — Encounter: Payer: Self-pay | Admitting: Neurology

## 2023-07-03 ENCOUNTER — Ambulatory Visit (INDEPENDENT_AMBULATORY_CARE_PROVIDER_SITE_OTHER): Payer: No Typology Code available for payment source | Admitting: Neurology

## 2023-07-03 ENCOUNTER — Other Ambulatory Visit: Payer: Self-pay | Admitting: Neurology

## 2023-07-03 VITALS — BP 201/106 | HR 56 | Ht 66.0 in | Wt 163.5 lb

## 2023-07-03 DIAGNOSIS — F02A0 Dementia in other diseases classified elsewhere, mild, without behavioral disturbance, psychotic disturbance, mood disturbance, and anxiety: Secondary | ICD-10-CM

## 2023-07-03 DIAGNOSIS — I119 Hypertensive heart disease without heart failure: Secondary | ICD-10-CM

## 2023-07-03 DIAGNOSIS — G301 Alzheimer's disease with late onset: Secondary | ICD-10-CM | POA: Diagnosis not present

## 2023-07-03 MED ORDER — VALSARTAN 320 MG PO TABS: 320.0000 mg | ORAL_TABLET | Freq: Every day | ORAL | 0 refills | Status: DC

## 2023-07-03 MED ORDER — HYDROCHLOROTHIAZIDE 50 MG PO TABS
50.0000 mg | ORAL_TABLET | Freq: Every day | ORAL | 0 refills | Status: DC
Start: 2023-07-03 — End: 2023-08-13

## 2023-07-03 MED ORDER — MEMANTINE HCL 10 MG PO TABS: 10.0000 mg | ORAL_TABLET | Freq: Two times a day (BID) | ORAL | 11 refills | Status: DC

## 2023-07-03 NOTE — Patient Instructions (Addendum)
Discontinue Aricept due to bradycardia Start Namenda 10 mg twice daily Increase hydrochlorothiazide to 50 mg daily Increase valsartan to 320 mg daily please monitor for side effect including dizziness.  PCP notified via epic secure message Please monitor your blood pressure at home and follow-up with your PCP Return as needed.   There are well-accepted and sensible ways to reduce risk for Alzheimers disease and other degenerative brain disorders .  Exercise Daily Walk A daily 20 minute walk should be part of your routine. Disease related apathy can be a significant roadblock to exercise and the only way to overcome this is to make it a daily routine and perhaps have a reward at the end (something your loved one loves to eat or drink perhaps) or a personal trainer coming to the home can also be very useful. Most importantly, the patient is much more likely to exercise if the caregiver / spouse does it with him/her. In general a structured, repetitive schedule is best.  General Health: Any diseases which effect your body will effect your brain such as a pneumonia, urinary infection, blood clot, heart attack or stroke. Keep contact with your primary care doctor for regular follow ups.  Sleep. A good nights sleep is healthy for the brain. Seven hours is recommended. If you have insomnia or poor sleep habits we can give you some instructions. If you have sleep apnea wear your mask.  Diet: Eating a heart healthy diet is also a good idea; fish and poultry instead of red meat, nuts (mostly non-peanuts), vegetables, fruits, olive oil or canola oil (instead of butter), minimal salt (use other spices to flavor foods), whole grain rice, bread, cereal and pasta and wine in moderation.Research is now showing that the MIND diet, which is a combination of The Mediterranean diet and the DASH diet, is beneficial for cognitive processing and longevity. Information about this diet can be found in The MIND Diet, a book  by Alonna Minium, MS, RDN, and online at [employer]  Finances, Power of [address] and Advance Directives: You should consider putting legal safeguards in place with regard to financial and medical decision making. While the spouse always has power of attorney for medical and financial issues in the absence of any form, you should consider what you want in case the spouse / caregiver is no longer around or capable of making decisions.
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
## 2023-07-07 LAB — ATN PROFILE
A -- Beta-amyloid 42/40 Ratio: 0.102 — ABNORMAL LOW (ref >0.102)
Beta-amyloid 40: 146.07 pg/mL
Beta-amyloid 42: 14.91 pg/mL
N -- NfL, Plasma: 3.66 pg/mL (ref 0.00–11.55)
T -- p-tau181: 1.02 pg/mL — ABNORMAL HIGH (ref 0.00–0.97)

## 2023-07-07 LAB — VITAMIN B12: Vitamin B-12: 524 pg/mL (ref 232–1245)

## 2023-07-09 NOTE — Progress Notes (Signed)
Please call and advise the patient/daughter that the recent labs we checked confirmed the presence of Alzheimer disease biomarkers. Therefore her dementia is most likely Alzheimer Dementia. No further action is required on these tests at this time. Please continue current medication and remind patient to keep any upcoming appointments or tests and to call us with any interim questions, concerns, problems or updates. Thanks,   Windell Norfolk, MD

## 2023-07-21 ENCOUNTER — Other Ambulatory Visit: Payer: Self-pay | Admitting: Neurology

## 2023-07-21 DIAGNOSIS — I119 Hypertensive heart disease without heart failure: Secondary | ICD-10-CM

## 2023-07-24 ENCOUNTER — Encounter: Payer: Self-pay | Admitting: Nurse Practitioner

## 2023-07-24 ENCOUNTER — Ambulatory Visit: Payer: No Typology Code available for payment source | Admitting: Nurse Practitioner

## 2023-07-24 VITALS — BP 134/90 | HR 71 | Temp 98.1°F | Ht 66.0 in | Wt 161.0 lb

## 2023-07-24 DIAGNOSIS — E039 Hypothyroidism, unspecified: Secondary | ICD-10-CM

## 2023-07-24 DIAGNOSIS — I10 Essential (primary) hypertension: Secondary | ICD-10-CM

## 2023-07-24 DIAGNOSIS — F03B0 Unspecified dementia, moderate, without behavioral disturbance, psychotic disturbance, mood disturbance, and anxiety: Secondary | ICD-10-CM

## 2023-07-24 DIAGNOSIS — E782 Mixed hyperlipidemia: Secondary | ICD-10-CM

## 2023-07-24 DIAGNOSIS — E2839 Other primary ovarian failure: Secondary | ICD-10-CM

## 2023-07-24 NOTE — Assessment & Plan Note (Signed)
Will increase carvedilol pending her repeat BP it has improved after the increase of valsartan and hydrochlorothiazide by Dr. Teresa Coombs

## 2023-07-24 NOTE — Progress Notes (Signed)
I,Jameka J Llittleton, CMA,acting as a Neurosurgeon for [employer], FNP.,have documented all relevant documentation on the behalf of [name], FNP,as directed by  [name], FNP while in the presence of [name], FNP.  Subjective:  Patient ID: Janet Barber , female    DOB: October 24, 1940 , 82 y.o.   MRN: [id number]  Chief Complaint  Patient presents with   Med check    HPI  Patient presents today for a 6 week med check. Patient was started on donzepil 5mg  for her memory. Patient's daughter reported they went to neuro and he stopped the donzepil and put her on namenda 10mg  bid. Patient's daughter is concerned about her blood pressure still being high. She has not checked her blood pressure at home. Her daughter reports she is taking BC powder. She has been eating more chips.   Her daughter does not feel she needs home health at this time. On the weekends she and her daughter will go out and will hang out with her friends.      Past Medical History:  Diagnosis Date   Allergic rhinitis    Anxiety    Colon cancer (HCC)    Diverticulosis of colon    History of hemorrhoids    Hx of colonic polyps    Hypercholesterolemia    Hypertension    Hypothyroidism    Lumbar back pain    Mitral valve prolapse    Overweight(278.02)    Venous insufficiency    Vitamin D deficiency      Family History  Problem Relation Age of Onset   Hypertension Maternal Grandmother      Current Outpatient Medications:    aspirin EC 81 MG tablet, Take 81 mg by mouth daily. Swallow whole., Disp: , Rfl:    atorvastatin (LIPITOR) 10 MG tablet, Take 1 tablet (10 mg total) by mouth daily., Disp: 90 tablet, Rfl: 1   carvedilol (COREG) 6.25 MG tablet, Take 1 tablet (6.25 mg total) by mouth 2 (two) times daily., Disp: 180 tablet, Rfl: 1   fluticasone (FLONASE) 50 MCG/ACT nasal spray, SHAKE LIQUID AND USE 2 SPRAYS IN EACH NOSTRIL DAILY, Disp: 48 g, Rfl: 2   hydrochlorothiazide (HYDRODIURIL) 50 MG tablet, Take  1 tablet (50 mg total) by mouth daily., Disp: 30 tablet, Rfl: 0   levothyroxine (SYNTHROID) 75 MCG tablet, Take 1 tablet (75 mcg total) by mouth daily before breakfast., Disp: 90 tablet, Rfl: 1   memantine (NAMENDA) 10 MG tablet, Take 1 tablet (10 mg total) by mouth 2 (two) times daily., Disp: 60 tablet, Rfl: 11   valsartan (DIOVAN) 320 MG tablet, TAKE 1 TABLET(320 MG) BY MOUTH DAILY, Disp: 90 tablet, Rfl: 1   Allergies  Allergen Reactions   Other Shortness Of Breath   Shellfish Allergy Itching and Shortness Of Breath   Penicillins     REACTION: unsure of reaction---years ago  Other Reaction(s): Other (See Comments)     Review of Systems  Constitutional: Negative.   Respiratory: Negative.    Cardiovascular: Negative.   Neurological: Negative.   Psychiatric/Behavioral: Negative.       Today's Vitals   07/24/23 1042 07/24/23 1127  BP: (!) 142/90 (!) 134/90  Pulse: 71   Temp: 98.1 F (36.7 C)   Weight: 161 lb (73 kg)   Height: 5\' 6"  (1.676 m)   PainSc: 0-No pain    Body mass index is 25.99 kg/m.  Wt Readings from Last 3 Encounters:  07/24/23 161 lb (73 kg)  07/03/23  163 lb 8 oz (74.2 kg)  05/17/23 161 lb 6.4 oz (73.2 kg)    Objective:  Physical Exam Vitals reviewed.  Constitutional:      General: She is not in acute distress.    Appearance: Normal appearance.  Cardiovascular:     Rate and Rhythm: Normal rate and regular rhythm.     Pulses: Normal pulses.     Heart sounds: Normal heart sounds. No murmur heard. Pulmonary:     Effort: Pulmonary effort is normal. No respiratory distress.     Breath sounds: Normal breath sounds. No wheezing.  Skin:    General: Skin is warm and dry.     Capillary Refill: Capillary refill takes less than 2 seconds.  Neurological:     General: No focal deficit present.     Mental Status: She is alert and oriented to person, place, and time.     Cranial Nerves: No cranial nerve deficit.     Motor: No weakness.  Psychiatric:         Mood and Affect: Mood normal.        Behavior: Behavior normal.        Thought Content: Thought content normal.        Cognition and Memory: Memory is impaired.        Judgment: Judgment normal. Judgment is not inappropriate.     Comments: Her daughter answers most questions         Assessment And Plan:  Moderate dementia without behavioral disturbance, psychotic disturbance, mood disturbance, or anxiety, unspecified dementia type [facility]) Assessment & Plan: She is now on Namenda and being followed by Neurology.    Essential hypertension Assessment & Plan: Will increase carvedilol pending her repeat BP it has improved after the increase of valsartan and hydrochlorothiazide by Dr. Teresa Coombs  Orders: -     CBC with Differential/Platelet -     CMP14+EGFR -     AMB Referral [facility]BCI Care Management  Hypothyroidism, unspecified type Assessment & Plan: Thyroid levels were slightly low at last visit will repeat levels after increasing levothyroxine 75 mcg   Orders: -     TSH + free T4  Mixed hyperlipidemia Assessment & Plan: Cholesterol levels are slightly elevated.  Will recheck today.  Continue statin, tolerating well  Orders: -     Lipid panel  Decreased estrogen level -     DG Bone Density; Future    Return for Uncontrolled BP check-3/4 months.  Patient was given opportunity to ask questions. Patient verbalized understanding of the plan and was able to repeat key elements of the plan. All questions were answered to their satisfaction.    Jeanell Sparrow, FNP, have reviewed all documentation for this visit. The documentation on 07/24/23 for the exam, diagnosis, procedures, and orders are all accurate and complete.   IF YOU HAVE BEEN REFERRED TO A SPECIALIST, IT MAY TAKE 1-2 WEEKS TO SCHEDULE/PROCESS THE REFERRAL. IF YOU HAVE NOT HEARD FROM US/SPECIALIST IN TWO WEEKS, PLEASE GIVE [place] A CALL AT [phone] X [phone].

## 2023-07-25 LAB — CBC WITH DIFFERENTIAL/PLATELET
Basophils Absolute: 0 10*3/uL (ref 0.0–0.2)
Basos: 1 %
EOS (ABSOLUTE): 0.2 10*3/uL (ref 0.0–0.4)
Eos: 4 %
Hematocrit: 40.1 % (ref 34.0–46.6)
Hemoglobin: 13.1 g/dL (ref 11.1–15.9)
Immature Grans (Abs): 0 10*3/uL (ref 0.0–0.1)
Immature Granulocytes: 0 %
Lymphocytes Absolute: 1.4 10*3/uL (ref 0.7–3.1)
Lymphs: 28 %
MCH: 30.1 pg (ref 26.6–33.0)
MCHC: 32.7 g/dL (ref 31.5–35.7)
MCV: 92 fL (ref 79–97)
Monocytes Absolute: 0.5 10*3/uL (ref 0.1–0.9)
Monocytes: 11 %
Neutrophils Absolute: 2.8 10*3/uL (ref 1.4–7.0)
Neutrophils: 56 %
Platelets: 200 10*3/uL (ref 150–450)
RBC: 4.35 x10E6/uL (ref 3.77–5.28)
RDW: 11.5 % — ABNORMAL LOW (ref 11.7–15.4)
WBC: 5 10*3/uL (ref 3.4–10.8)

## 2023-07-25 LAB — CMP14+EGFR
ALT: 13 [IU]/L (ref 0–32)
AST: 21 [IU]/L (ref 0–40)
Albumin: 3.8 g/dL (ref 3.7–4.7)
Alkaline Phosphatase: 76 [IU]/L (ref 44–121)
BUN/Creatinine Ratio: 15 (ref 12–28)
BUN: 17 mg/dL (ref 8–27)
Bilirubin Total: 0.8 mg/dL (ref 0.0–1.2)
CO2: 26 mmol/L (ref 20–29)
Calcium: 9.9 mg/dL (ref 8.7–10.3)
Chloride: 105 mmol/L (ref 96–106)
Creatinine, Ser: 1.1 mg/dL — ABNORMAL HIGH (ref 0.57–1.00)
Globulin, Total: 2.7 g/dL (ref 1.5–4.5)
Glucose: 97 mg/dL (ref 70–99)
Potassium: 3.4 mmol/L — ABNORMAL LOW (ref 3.5–5.2)
Sodium: 142 mmol/L (ref 134–144)
Total Protein: 6.5 g/dL (ref 6.0–8.5)
eGFR: 50 mL/min/{1.73_m2} — ABNORMAL LOW (ref >59)

## 2023-07-25 LAB — LIPID PANEL
Chol/HDL Ratio: 3 ratio (ref 0.0–4.4)
Cholesterol, Total: 203 mg/dL — ABNORMAL HIGH (ref 100–199)
HDL: 68 mg/dL (ref >39)
LDL Chol Calc (NIH): 123 mg/dL — ABNORMAL HIGH (ref 0–99)
Triglycerides: 67 mg/dL (ref 0–149)
VLDL Cholesterol Cal: 12 mg/dL (ref 5–40)

## 2023-07-25 LAB — TSH+FREE T4
Free T4: 1.58 ng/dL (ref 0.82–1.77)
TSH: 3.79 u[IU]/mL (ref 0.450–4.500)

## 2023-07-26 ENCOUNTER — Telehealth: Payer: Self-pay

## 2023-07-26 ENCOUNTER — Other Ambulatory Visit: Payer: Self-pay | Admitting: Neurology

## 2023-07-26 DIAGNOSIS — I119 Hypertensive heart disease without heart failure: Secondary | ICD-10-CM

## 2023-07-26 NOTE — Progress Notes (Signed)
   Care Guide Note  07/26/2023 Name: Janet Barber MRN: [id number] DOB: 21-Jun-1941  Referred by: [name], FNP Reason for referral : Care Coordination (Outreach to schedule with Pharm d )   Janet Barber is a 82 y.o. year old female who is a primary care patient of [name], FNP. [name] was referred to the pharmacist for assistance related to HTN.    Successful contact was made with the patient to discuss pharmacy services including being ready for the pharmacist to call at least 5 minutes before the scheduled appointment time, to have medication bottles and any blood sugar or blood pressure readings ready for review. The patient agreed to meet with the pharmacist via with the pharmacist via telephone visit on (date/time).  08/15/2023  Penne Lash, RMA Care Guide [facility]  [place], [place] [address] Direct Dial: [phone] [email or web address]

## 2023-07-26 NOTE — Progress Notes (Signed)
   Care Guide Note  07/26/2023 Name: Janet Barber MRN: [id number] DOB: [birth year]  Referred by: [name], FNP Reason for referral : Care Coordination (Outreach to schedule with Pharm d )   Janet Barber is a 82 y.o. year old female who is a primary care patient of [name], FNP. Janet Barber was referred to the pharmacist for assistance related to HTN.    An unsuccessful telephone outreach was attempted today to contact the patient who was referred to the pharmacy team for assistance with medication management. Additional attempts will be made to contact the patient.   Penne Lash, RMA Care Guide [facility]  [place], [place] [address] Direct Dial: [phone] [email or web address]

## 2023-08-02 NOTE — Assessment & Plan Note (Signed)
She is now on Namenda and being followed by Neurology.

## 2023-08-02 NOTE — Assessment & Plan Note (Signed)
Thyroid levels were slightly low at last visit will repeat levels after increasing levothyroxine 75 mcg

## 2023-08-02 NOTE — Assessment & Plan Note (Signed)
Cholesterol levels are slightly elevated.  Will recheck today.  Continue statin, tolerating well

## 2023-08-10 ENCOUNTER — Other Ambulatory Visit: Payer: Self-pay | Admitting: Neurology

## 2023-08-10 DIAGNOSIS — I119 Hypertensive heart disease without heart failure: Secondary | ICD-10-CM

## 2023-08-13 ENCOUNTER — Other Ambulatory Visit: Payer: Self-pay | Admitting: Neurology

## 2023-08-13 DIAGNOSIS — I119 Hypertensive heart disease without heart failure: Secondary | ICD-10-CM

## 2023-08-15 ENCOUNTER — Other Ambulatory Visit: Payer: No Typology Code available for payment source | Admitting: Pharmacist

## 2023-08-15 ENCOUNTER — Encounter: Payer: Self-pay | Admitting: Pharmacist

## 2023-08-15 DIAGNOSIS — I119 Hypertensive heart disease without heart failure: Secondary | ICD-10-CM

## 2023-08-15 MED ORDER — HYDROCHLOROTHIAZIDE 25 MG PO TABS
25.0000 mg | ORAL_TABLET | Freq: Every day | ORAL | 3 refills | Status: DC
Start: 2023-08-15 — End: 2023-11-14

## 2023-08-15 NOTE — Progress Notes (Signed)
08/15/2023 Name: Janet Barber MRN: [id number] DOB: 1941/05/30  Chief Complaint  Patient presents with   Medication Management   Hypertension    Janet Barber is a 82 y.o. year old female who presented for a telephone visit.   They were referred to the pharmacist by their PCP for assistance in managing hypertension.    Subjective:  Care Team: Primary Care Provider: [name], FNP ; Next Scheduled Visit: 10/31/23  Medication Access/Adherence  Current Pharmacy:  [id number] - [place], [place] - [address] [place] [address] Phone: [phone] Fax: [phone]   Patient reports affordability concerns with their medications: No  Patient reports access/transportation concerns to their pharmacy: No  Patient reports adherence concerns with their medications:  No     Hypertension:  Current medications: valsartan 320 mg daily (taking 2 tablets of valsartan 160 mg), HCTZ 50 mg daily (taking 25 mg daily), carvedilol 6.25 mg BID  Patient does not have a validated, automated, upper arm home BP cuff. Patient has a wrist home BP cuff and daughter reports it is inaccurate. Patient is going to the [facility] to check BP readings. Recommend to purchase Omron Series 3 upper arm home BP cuff and record readings. Current blood pressure readings readings: 127/84 to 167/109 (6 day average 144/94) on [facility] BP machine.  Patient reports hypertensive symptoms including headache. Takes BC powder when headaches occur.   Objective:  No results found for: "HGBA1C"  Lab Results  Component Value Date   CREATININE 1.10 (H) 07/24/2023   BUN 17 07/24/2023   NA 142 07/24/2023   K 3.4 (L) 07/24/2023   CL 105 07/24/2023   CO2 26 07/24/2023    Lab Results  Component Value Date   CHOL 203 (H) 07/24/2023   HDL 68 07/24/2023   LDLCALC 123 (H) 07/24/2023   TRIG 67 07/24/2023   CHOLHDL 3.0  07/24/2023    Medications Reviewed Today     Reviewed by Janet Barber, RPH-CPP (Pharmacist) on 08/15/23 at 1047  Med List Status: <None>   Medication Order Taking? Sig Documenting Provider Last Dose Status Informant  aspirin EC 81 MG tablet [id number]  Take 81 mg by mouth daily. Swallow whole. [provider]  Active Self  atorvastatin (LIPITOR) 10 MG tablet [id number]  Take 1 tablet (10 mg total) by mouth daily. [name], FNP  Active   carvedilol (COREG) 6.25 MG tablet [id number]  Take 1 tablet (6.25 mg total) by mouth 2 (two) times daily. [name], FNP  Active   fluticasone [facility]) 50 MCG/ACT nasal spray [id number]  SHAKE LIQUID AND USE 2 SPRAYS IN EACH NOSTRIL DAILY [name], FNP  Active   hydrochlorothiazide (HYDRODIURIL) 50 MG tablet [id number]  TAKE 1 TABLET(50 MG) BY MOUTH DAILY Barber, Amadou, MD  Active   levothyroxine (SYNTHROID) 75 MCG tablet [id number]  Take 1 tablet (75 mcg total) by mouth daily before breakfast. [name], FNP  Active   memantine [facility]) 10 MG tablet [id number] Yes Take 1 tablet (10 mg total) by mouth 2 (two) times daily. Janet Norfolk, MD Taking Active   valsartan (DIOVAN) 320 MG tablet [id number]  TAKE 1 TABLET(320 MG) BY MOUTH DAILY Barber, Amadou, MD  Active               Assessment/Plan:   Hypertension: - Currently uncontrolled based on 6-day average of home BP readings on [facility] BP machine  above goal of < 140/90 (higher goal given age and lack of existing CV disease) - Reviewed appropriate blood pressure monitoring technique and reviewed goal blood pressure. Recommended to check home blood pressure and heart rate. Daughter will purchase Omron upper arm BP cuff and send readings through MyChart in 2 weeks. - Discussed risks of BC powder (elevated BP, renal dysfunction, GI bleed). Recommend to stop BC powder. Take OTC Tylenol for headaches as needed. - Recommend to continue valsartan 320 mg daily, HCTZ 25 mg daily, and  carvedilol 6.25 mg BID. Consider addition of amlodipine vs titration of carvedilol in the future if BP is elevated on home readings with validated BP cuff.    Follow Up Plan: telephone visit in 6 weeks  Catie Eppie Gibson, PharmD, BCACP, CPP Clinical Pharmacist [facility] [phone]

## 2023-08-15 NOTE — Patient Instructions (Addendum)
[name] and [place],   It was great talking with you today!  Please reach out with any questions!

## 2023-09-25 ENCOUNTER — Other Ambulatory Visit: Payer: Self-pay

## 2023-09-25 MED ORDER — FLUTICASONE PROPIONATE 50 MCG/ACT NA SUSP: NASAL | 2 refills | Status: DC

## 2023-09-26 ENCOUNTER — Other Ambulatory Visit: Payer: Self-pay

## 2023-09-26 MED ORDER — FLUTICASONE PROPIONATE 50 MCG/ACT NA SUSP: NASAL | 2 refills | Status: DC

## 2023-09-27 ENCOUNTER — Other Ambulatory Visit: Payer: Self-pay

## 2023-09-27 MED ORDER — FLUTICASONE PROPIONATE 50 MCG/ACT NA SUSP: NASAL | 2 refills | Status: DC

## 2023-10-01 ENCOUNTER — Encounter: Payer: Self-pay | Admitting: Pharmacist

## 2023-10-01 ENCOUNTER — Other Ambulatory Visit: Payer: Self-pay | Admitting: Pharmacist

## 2023-10-01 DIAGNOSIS — I10 Essential (primary) hypertension: Secondary | ICD-10-CM

## 2023-10-01 NOTE — Progress Notes (Signed)
 10/01/2023 Name: Janet Barber MRN: [id number] DOB: 09/30/1940  Chief Complaint  Patient presents with   Medication Management   Hypertension    Janet Barber is a 83 y.o. year old female who presented for a telephone visit.   They were referred to the pharmacist by their PCP for assistance in managing hypertension.    Subjective:  Care Team: Primary Care Provider: Georgina Speaks, FNP ; Next Scheduled Visit: 10/31/23  Medication Access/Adherence  Current Pharmacy:  [id number] - [place], [place] - [address] [place] [address] Phone: [phone] Fax: [phone]   Patient reports affordability concerns with their medications: No  Patient reports access/transportation concerns to their pharmacy: No  Patient reports adherence concerns with their medications:  No      Hypertension:  Current medications: valsartan  320 mg daily, hydrochlorothiazide  25 mg daily, carvedilol  6.25 mg twice daily   Notes she has stopped using BC Powders. Reports decrease in headaches.   Patient has a validated, automated, upper arm home BP cuff - wrist cuff, but has not been using at home Current blood pressure readings readings: reports [facility] machine readings ~130s/80s;  Patient denies hypotensive s/sx including dizziness, lightheadedness.  Patient denies hypertensive symptoms including headache, chest pain, shortness of breath;   Objective:  No results found for: HGBA1C  Lab Results  Component Value Date   CREATININE 1.10 (H) 07/24/2023   BUN 17 07/24/2023   NA 142 07/24/2023   K 3.4 (L) 07/24/2023   CL 105 07/24/2023   CO2 26 07/24/2023    Lab Results  Component Value Date   CHOL 203 (H) 07/24/2023   HDL 68 07/24/2023   LDLCALC 123 (H) 07/24/2023   TRIG 67 07/24/2023   CHOLHDL 3.0 07/24/2023    Medications Reviewed Today     Reviewed by Janet Barber, RPH-CPP (Pharmacist)  on 10/01/23 at 1107  Med List Status: <None>   Medication Order Taking? Sig Documenting Provider Last Dose Status Informant  aspirin EC 81 MG tablet [id number] Yes Take 81 mg by mouth daily. Swallow whole. [provider] Taking Active Self  atorvastatin  (LIPITOR) 10 MG tablet [id number] Yes Take 1 tablet (10 mg total) by mouth daily. Janet Speaks, FNP Taking Active   carvedilol  (COREG ) 6.25 MG tablet [id number] Yes Take 1 tablet (6.25 mg total) by mouth 2 (two) times daily. Janet Speaks, FNP Taking Active   fluticasone  (FLONASE ) 50 MCG/ACT nasal spray [id number]  SHAKE LIQUID AND USE 2 SPRAYS IN EACH NOSTRIL DAILY Janet Speaks, FNP  Active   hydrochlorothiazide  (HYDRODIURIL ) 25 MG tablet [id number] Yes Take 1 tablet (25 mg total) by mouth daily. Janet Speaks, FNP Taking Active   levothyroxine  (SYNTHROID ) 75 MCG tablet [id number] Yes Take 1 tablet (75 mcg total) by mouth daily before breakfast. Janet Speaks, FNP Taking Active   memantine  (NAMENDA ) 10 MG tablet [id number] Yes Take 1 tablet (10 mg total) by mouth 2 (two) times daily. Janet Lek, MD Taking Active   valsartan  (DIOVAN ) 320 MG tablet [id number] Yes TAKE 1 TABLET(320 MG) BY MOUTH DAILY Barber, Amadou, MD Taking Active               Assessment/Plan:   Hypertension: - Currently controlled, but unclear how accurate [facility] machine is. Again counseled to purchase an upper arm home BP machine.  - Reviewed appropriate blood pressure monitoring technique and reviewed goal blood pressure. Recommended to check home  blood pressure and heart rate periodically, document and provide at future readings.  - Recommend to continue current regimen at this time.      Follow Up Plan: follow up with PCP as scheduled  Janet Barber, PharmD, BCACP, CPP Clinical Pharmacist [facility] [phone]

## 2023-10-03 ENCOUNTER — Other Ambulatory Visit: Payer: Self-pay

## 2023-10-31 ENCOUNTER — Ambulatory Visit: Payer: Medicare HMO | Admitting: Nurse Practitioner

## 2023-10-31 ENCOUNTER — Encounter: Payer: Self-pay | Admitting: Nurse Practitioner

## 2023-10-31 VITALS — BP 110/76 | HR 76 | Temp 98.7°F | Ht 66.0 in | Wt 169.0 lb

## 2023-10-31 DIAGNOSIS — Z2821 Immunization not carried out because of patient refusal: Secondary | ICD-10-CM | POA: Diagnosis not present

## 2023-10-31 DIAGNOSIS — I10 Essential (primary) hypertension: Secondary | ICD-10-CM | POA: Diagnosis not present

## 2023-10-31 DIAGNOSIS — F02B0 Dementia in other diseases classified elsewhere, moderate, without behavioral disturbance, psychotic disturbance, mood disturbance, and anxiety: Secondary | ICD-10-CM

## 2023-10-31 DIAGNOSIS — E782 Mixed hyperlipidemia: Secondary | ICD-10-CM | POA: Diagnosis not present

## 2023-10-31 DIAGNOSIS — E039 Hypothyroidism, unspecified: Secondary | ICD-10-CM | POA: Diagnosis not present

## 2023-10-31 DIAGNOSIS — G301 Alzheimer's disease with late onset: Secondary | ICD-10-CM

## 2023-10-31 DIAGNOSIS — Z6827 Body mass index (BMI) 27.0-27.9, adult: Secondary | ICD-10-CM

## 2023-10-31 NOTE — Assessment & Plan Note (Addendum)
She is doing well and tolerating her namenda well. She does not need to f/u with Neurology unless needed. I expressed to the daughter to consider a life alert or at least a device/phone that has location capability.

## 2023-10-31 NOTE — Assessment & Plan Note (Signed)
Blood pressure is well controlled, continue current medications.

## 2023-10-31 NOTE — Progress Notes (Signed)
Janet Barber, CMA,acting as a Neurosurgeon for Janet Felts, FNP.,have documented all relevant documentation on the behalf of Janet Felts, FNP,as directed by  Janet Felts, FNP while in the presence of Janet Felts, FNP.  Subjective:  Patient ID: Janet Barber , female    DOB: 24-Jan-1941 , 83 y.o.   MRN: [id number]  Chief Complaint  Patient presents with   Hypertension    HPI  Patient presents today for a bp and chol follow up, Patient reports compliance with medication. Patient denies any chest pain, SOB, or headaches. Patient has no concerns today. She is here with her daughter Janet Barber today.   Her daughter explained when she had a dentist appt and was sedated the patient was able to drive her home however it took 2 hours to get there. She is unaware of how her mother got them home. She eventually had to go to the ER (daughter) after falling and hitting her head. She is unaware of what actually happened that day.   Hypertension This is a chronic problem. The current episode started more than 1 year ago. The problem has been gradually worsening since onset. The problem is uncontrolled. Pertinent negatives include no anxiety, chest pain, headaches, palpitations or shortness of breath. Past treatments include angiotensin blockers. Compliance problems: follow up appts.  Identifiable causes of hypertension include a thyroid problem. There is no history of chronic renal disease.    Past Medical History:  Diagnosis Date   Allergic rhinitis    Anxiety    Colon cancer (HCC)    Diverticulosis of colon    History of hemorrhoids    Hx of colonic polyps    Hypercholesterolemia    Hypertension    Hypothyroidism    Lumbar back pain    Mitral valve prolapse    Overweight(278.02)    Venous insufficiency    Vitamin D deficiency      Family History  Problem Relation Age of Onset   Hypertension Maternal Grandmother      Current Outpatient Medications:    aspirin EC 81 MG tablet, Take 81  mg by mouth daily. Swallow whole., Disp: , Rfl:    atorvastatin (LIPITOR) 10 MG tablet, Take 1 tablet (10 mg total) by mouth daily., Disp: 90 tablet, Rfl: 1   carvedilol (COREG) 6.25 MG tablet, Take 1 tablet (6.25 mg total) by mouth 2 (two) times daily., Disp: 180 tablet, Rfl: 1   fluticasone (FLONASE) 50 MCG/ACT nasal spray, SHAKE LIQUID AND USE 2 SPRAYS IN EACH NOSTRIL DAILY, Disp: 48 g, Rfl: 2   hydrochlorothiazide (HYDRODIURIL) 25 MG tablet, Take 1 tablet (25 mg total) by mouth daily., Disp: 90 tablet, Rfl: 3   levothyroxine (SYNTHROID) 75 MCG tablet, Take 1 tablet (75 mcg total) by mouth daily before breakfast., Disp: 90 tablet, Rfl: 1   memantine (NAMENDA) 10 MG tablet, Take 1 tablet (10 mg total) by mouth 2 (two) times daily., Disp: 60 tablet, Rfl: 11   valsartan (DIOVAN) 320 MG tablet, TAKE 1 TABLET(320 MG) BY MOUTH DAILY, Disp: 90 tablet, Rfl: 1   Allergies  Allergen Reactions   Other Shortness Of Breath   Shellfish Allergy Itching and Shortness Of Breath   Penicillins     REACTION: unsure of reaction---years ago  Other Reaction(s): Other (See Comments)     Review of Systems  Constitutional: Negative.   Respiratory: Negative.  Negative for shortness of breath.   Cardiovascular: Negative.  Negative for chest pain and palpitations.  Neurological: Negative.  Negative  for headaches.  Psychiatric/Behavioral: Negative.       Today's Vitals   10/31/23 1040  BP: 110/76  Pulse: 76  Temp: 98.7 F (37.1 C)  TempSrc: Oral  Weight: 169 lb (76.7 kg)  Height: 5\' 6"  (1.676 m)  PainSc: 0-No pain   Body mass index is 27.28 kg/m.  Wt Readings from Last 3 Encounters:  10/31/23 169 lb (76.7 kg)  07/24/23 161 lb (73 kg)  07/03/23 163 lb 8 oz (74.2 kg)    Objective:  Physical Exam Vitals reviewed.  Constitutional:      General: She is not in acute distress.    Appearance: Normal appearance.  Cardiovascular:     Rate and Rhythm: Normal rate and regular rhythm.     Pulses: Normal  pulses.     Heart sounds: Normal heart sounds. No murmur heard. Pulmonary:     Effort: Pulmonary effort is normal. No respiratory distress.     Breath sounds: Normal breath sounds. No wheezing.  Skin:    General: Skin is warm and dry.     Capillary Refill: Capillary refill takes less than 2 seconds.  Neurological:     General: No focal deficit present.     Mental Status: She is alert and oriented to person, place, and time.     Cranial Nerves: No cranial nerve deficit.     Motor: No weakness.  Psychiatric:        Mood and Affect: Mood normal.        Behavior: Behavior normal.        Thought Content: Thought content normal.        Cognition and Memory: Memory is impaired.        Judgment: Judgment normal. Judgment is not inappropriate.     Comments: Her daughter answers most questions         Assessment And Plan:  Essential hypertension Assessment & Plan: Blood pressure is well controlled, continue current medications.   Orders: -     BMP8+eGFR  Mixed hyperlipidemia Assessment & Plan: Cholesterol levels are slightly elevated.  Will recheck today.  If remains elevated will increase her atorvastatin to 20 mg.   Orders: -     Lipid panel  Hypothyroidism, unspecified type Assessment & Plan: Thyroid levels are stable. Continue current medications and will make changes as necessary pending lab results.   Orders: -     TSH + free T4  Moderate late onset Alzheimer's dementia without behavioral disturbance, psychotic disturbance, mood disturbance, or anxiety (HCC) Assessment & Plan: She is doing well and tolerating her namenda well. She does not need to f/u with Neurology unless needed. I expressed to the daughter to consider a life alert or at least a device/phone that has location capability.    COVID-19 vaccination declined Assessment & Plan: Declines covid 19 vaccine. Discussed risk of covid 45 and if she changes her mind about the vaccine to call the office. Education  has been provided regarding the importance of this vaccine but patient still declined. Advised may receive this vaccine at local pharmacy or Health Dept.or vaccine clinic. Aware to provide a copy of the vaccination record if obtained from local pharmacy or Health Dept.  Encouraged to take multivitamin, vitamin d, vitamin c and zinc to increase immune system. Aware can call office if would like to have vaccine here at office. Verbalized acceptance and understanding.    BMI 27.0-27.9,adult    Return for 4 month cholesterol recheck.   Patient was given  opportunity to ask questions. Patient verbalized understanding of the plan and was able to repeat key elements of the plan. All questions were answered to their satisfaction.    Jeanell Sparrow, FNP, have reviewed all documentation for this visit. The documentation on 10/31/23 for the exam, diagnosis, procedures, and orders are all accurate and complete.   IF YOU HAVE BEEN REFERRED TO A SPECIALIST, IT MAY TAKE 1-2 WEEKS TO SCHEDULE/PROCESS THE REFERRAL. IF YOU HAVE NOT HEARD FROM US/SPECIALIST IN TWO WEEKS, PLEASE GIVE [place] A CALL AT [phone].

## 2023-10-31 NOTE — Assessment & Plan Note (Signed)

## 2023-10-31 NOTE — Assessment & Plan Note (Signed)
Thyroid levels are stable. Continue current medications and will make changes as necessary pending lab results.

## 2023-10-31 NOTE — Assessment & Plan Note (Addendum)
Cholesterol levels are slightly elevated.  Will recheck today.  If remains elevated will increase her atorvastatin to 20 mg.

## 2023-11-10 ENCOUNTER — Other Ambulatory Visit: Payer: Self-pay | Admitting: Neurology

## 2023-11-10 DIAGNOSIS — I119 Hypertensive heart disease without heart failure: Secondary | ICD-10-CM

## 2023-11-14 ENCOUNTER — Other Ambulatory Visit: Payer: Self-pay | Admitting: Nurse Practitioner

## 2023-11-14 ENCOUNTER — Telehealth: Payer: Self-pay | Admitting: Nurse Practitioner

## 2023-11-14 DIAGNOSIS — E782 Mixed hyperlipidemia: Secondary | ICD-10-CM

## 2023-11-14 DIAGNOSIS — I119 Hypertensive heart disease without heart failure: Secondary | ICD-10-CM

## 2023-11-14 DIAGNOSIS — E039 Hypothyroidism, unspecified: Secondary | ICD-10-CM

## 2023-11-14 MED ORDER — HYDROCHLOROTHIAZIDE 25 MG PO TABS
25.0000 mg | ORAL_TABLET | Freq: Every day | ORAL | 3 refills | Status: DC
Start: 2023-11-14 — End: 2024-04-29

## 2023-11-14 MED ORDER — VALSARTAN 320 MG PO TABS
ORAL_TABLET | ORAL | 1 refills | Status: DC
Start: 2023-11-14 — End: 2024-04-29

## 2023-11-14 MED ORDER — LEVOTHYROXINE SODIUM 75 MCG PO TABS
75.0000 ug | ORAL_TABLET | Freq: Every day | ORAL | 1 refills | Status: DC
Start: 2023-11-14 — End: 2024-04-29

## 2023-11-14 MED ORDER — ATORVASTATIN CALCIUM 10 MG PO TABS: 10.0000 mg | ORAL_TABLET | Freq: Every day | ORAL | 1 refills | Status: DC

## 2023-11-14 MED ORDER — CARVEDILOL 6.25 MG PO TABS: 6.2500 mg | ORAL_TABLET | Freq: Two times a day (BID) | ORAL | 1 refills | Status: DC

## 2023-11-14 NOTE — Telephone Encounter (Signed)
 Copied from CRM [id number]. Topic: Clinical - Medication Refill >> Nov 14, 2023  3:25 PM Geroge Baseman wrote: Most Recent Primary Care Visit:  Provider: [name]  Department: [facility]  Visit Type: OFFICE VISIT  Date: 10/31/2023  Medication: hydrochlorothiazide (HYDRODIURIL) 25 MG tablet   Has the patient contacted their pharmacy? Yes Pharmacy   Is this the correct pharmacy for this prescription? Yes If no, delete pharmacy and type the correct one.  This is the patient's preferred pharmacy:  [facility] [address] [place], [place], [place] [address]     Has the prescription been filled recently? No  Is the patient out of the medication? Yes  Has the patient been seen for an appointment in the last year OR does the patient have an upcoming appointment? Yes  Can we respond through MyChart? No  Agent: Please be advised that Rx refills may take up to 3 business days. We ask that you follow-up with your pharmacy.

## 2024-03-10 ENCOUNTER — Other Ambulatory Visit: Payer: No Typology Code available for payment source

## 2024-04-29 ENCOUNTER — Encounter: Payer: Self-pay | Admitting: Nurse Practitioner

## 2024-04-29 ENCOUNTER — Ambulatory Visit: Payer: Medicare HMO | Admitting: Nurse Practitioner

## 2024-04-29 VITALS — BP 130/80 | HR 86 | Temp 98.5°F | Ht 66.0 in | Wt 166.6 lb

## 2024-04-29 DIAGNOSIS — F02B0 Dementia in other diseases classified elsewhere, moderate, without behavioral disturbance, psychotic disturbance, mood disturbance, and anxiety: Secondary | ICD-10-CM | POA: Diagnosis not present

## 2024-04-29 DIAGNOSIS — I10 Essential (primary) hypertension: Secondary | ICD-10-CM

## 2024-04-29 DIAGNOSIS — E663 Overweight: Secondary | ICD-10-CM

## 2024-04-29 DIAGNOSIS — E782 Mixed hyperlipidemia: Secondary | ICD-10-CM | POA: Diagnosis not present

## 2024-04-29 DIAGNOSIS — Z79899 Other long term (current) drug therapy: Secondary | ICD-10-CM

## 2024-04-29 DIAGNOSIS — E039 Hypothyroidism, unspecified: Secondary | ICD-10-CM

## 2024-04-29 DIAGNOSIS — E2839 Other primary ovarian failure: Secondary | ICD-10-CM

## 2024-04-29 DIAGNOSIS — G301 Alzheimer's disease with late onset: Secondary | ICD-10-CM

## 2024-04-29 DIAGNOSIS — I119 Hypertensive heart disease without heart failure: Secondary | ICD-10-CM

## 2024-04-29 DIAGNOSIS — Z2821 Immunization not carried out because of patient refusal: Secondary | ICD-10-CM

## 2024-04-29 DIAGNOSIS — I059 Rheumatic mitral valve disease, unspecified: Secondary | ICD-10-CM

## 2024-04-29 MED ORDER — CARVEDILOL 6.25 MG PO TABS
6.2500 mg | ORAL_TABLET | Freq: Two times a day (BID) | ORAL | 1 refills | Status: DC
Start: 2024-04-29 — End: 2024-07-18

## 2024-04-29 MED ORDER — LEVOTHYROXINE SODIUM 75 MCG PO TABS: 75.0000 ug | ORAL_TABLET | Freq: Every day | ORAL | 1 refills | Status: AC

## 2024-04-29 MED ORDER — VALSARTAN 320 MG PO TABS: ORAL_TABLET | ORAL | 1 refills | Status: AC

## 2024-04-29 MED ORDER — HYDROCHLOROTHIAZIDE 25 MG PO TABS: 25.0000 mg | ORAL_TABLET | Freq: Every day | ORAL | 3 refills | Status: AC

## 2024-04-29 MED ORDER — FLUTICASONE PROPIONATE 50 MCG/ACT NA SUSP: NASAL | 2 refills | Status: AC

## 2024-04-29 MED ORDER — ATORVASTATIN CALCIUM 10 MG PO TABS: 10.0000 mg | ORAL_TABLET | Freq: Every day | ORAL | 1 refills | Status: AC

## 2024-04-29 NOTE — Progress Notes (Signed)
 LILLETTE Kristeen JINNY Gladis, CMA,acting as a Neurosurgeon for Gaines Ada, FNP.,have documented all relevant documentation on the behalf of Gaines Ada, FNP,as directed by  Gaines Ada, FNP while in the presence of Gaines Ada, FNP.  Subjective:  Patient ID: Janet Barber , female    DOB: 12/17/1940 , 83 y.o.   MRN: [id number]  Chief Complaint  Patient presents with   Hypertension    Patient presents today for a bp and CHOL follow up, Patient reports compliance with medication. Patient denies any chest pain, SOB, or headaches. Patient has no concerns today.     HPI  HPI  Discussed the use of AI scribe software for clinical note transcription with the patient, who gave verbal consent to proceed.  History of Present Illness Janet Barber is an 83 year old female who presents for a follow-up visit regarding her bone density test scheduling and medication management.  She has been experiencing difficulties with scheduling her bone density test. Initially, a test was scheduled for June 23rd, which was canceled. Another was scheduled for July 24th at [address], but it was also canceled. A new order has been placed, but there is confusion about the location and scheduling, with a tentative date of August 21st without a specified address.  She has a history of hypertension. She takes her medication regularly, as confirmed by her caregiver, who ensures she takes it in the morning. Her diet recently included smoked chicken, slaw, and [place] fries, and she avoids bread.  She experienced a fall approximately three weeks ago, landing on her hand. An X-ray was performed, which showed no fracture or break, but she was given a brace for support. She reports limited use of her arm due to the injury.  She is on seven different medications, including Namenda , which is managed by her neurologist. There have been issues with prescriptions being sent to the wrong pharmacy, but efforts have been made to correct  this.  Her caregiver reports concerns about her traveling with a friend to [place] , as she has previously forgotten to take her medications on trips. The caregiver is cautious about her being in unfamiliar environments due to potential confusion.  Her caregiver notes that she often feels cold, wearing multiple layers of clothing despite the room being warm. Limited exercise mainly involves walking around the house.   Past Medical History:  Diagnosis Date   Allergic rhinitis    Anxiety    Colon cancer (HCC)    Diverticulosis of colon    History of hemorrhoids    Hx of colonic polyps    Hypercholesterolemia    Hypertension    Hypothyroidism    Lumbar back pain    Mitral valve prolapse    Overweight(278.02)    Venous insufficiency    Vitamin D  deficiency      Family History  Problem Relation Age of Onset   Hypertension Maternal Grandmother      Current Outpatient Medications:    aspirin EC 81 MG tablet, Take 81 mg by mouth daily. Swallow whole., Disp: , Rfl:    memantine  (NAMENDA ) 10 MG tablet, Take 1 tablet (10 mg total) by mouth 2 (two) times daily., Disp: 60 tablet, Rfl: 11   atorvastatin  (LIPITOR) 10 MG tablet, Take 1 tablet (10 mg total) by mouth daily., Disp: 90 tablet, Rfl: 1   carvedilol  (COREG ) 6.25 MG tablet, Take 1 tablet (6.25 mg total) by mouth 2 (two) times daily., Disp: 180 tablet, Rfl: 1   fluticasone  (FLONASE ) 50  MCG/ACT nasal spray, SHAKE LIQUID AND USE 2 SPRAYS IN EACH NOSTRIL DAILY, Disp: 48 g, Rfl: 2   hydrochlorothiazide  (HYDRODIURIL ) 25 MG tablet, Take 1 tablet (25 mg total) by mouth daily., Disp: 90 tablet, Rfl: 3   levothyroxine  (SYNTHROID ) 75 MCG tablet, Take 1 tablet (75 mcg total) by mouth daily before breakfast., Disp: 90 tablet, Rfl: 1   valsartan  (DIOVAN ) 320 MG tablet, TAKE 1 TABLET(320 MG) BY MOUTH DAILY, Disp: 90 tablet, Rfl: 1   Allergies  Allergen Reactions   Other Shortness Of Breath   Shellfish Allergy Itching and Shortness Of Breath    Penicillins     REACTION: unsure of reaction---years ago  Other Reaction(s): Other (See Comments)     Review of Systems  Constitutional: Negative.   Respiratory: Negative.  Negative for shortness of breath.   Cardiovascular: Negative.  Negative for chest pain and palpitations.  Neurological: Negative.  Negative for headaches.  Psychiatric/Behavioral: Negative.       Today's Vitals   04/29/24 1025 04/29/24 1115  BP: (!) 140/90 130/80  Pulse: 86   Temp: 98.5 F (36.9 C)   TempSrc: Oral   Weight: 166 lb 9.6 oz (75.6 kg)   Height: 5' 6 (1.676 m)   PainSc: 0-No pain    Body mass index is 26.89 kg/m.  Wt Readings from Last 3 Encounters:  04/29/24 166 lb 9.6 oz (75.6 kg)  10/31/23 169 lb (76.7 kg)  07/24/23 161 lb (73 kg)    Objective:  Physical Exam Vitals and nursing note reviewed.  Constitutional:      General: She is not in acute distress.    Appearance: Normal appearance.  Cardiovascular:     Rate and Rhythm: Normal rate and regular rhythm.     Pulses: Normal pulses.     Heart sounds: Normal heart sounds. No murmur heard. Pulmonary:     Effort: Pulmonary effort is normal. No respiratory distress.     Breath sounds: Normal breath sounds. No wheezing.  Skin:    General: Skin is warm and dry.     Capillary Refill: Capillary refill takes less than 2 seconds.  Neurological:     General: No focal deficit present.     Mental Status: She is alert and oriented to person, place, and time.     Cranial Nerves: No cranial nerve deficit.     Motor: No weakness.  Psychiatric:        Mood and Affect: Mood normal.        Behavior: Behavior normal.        Thought Content: Thought content normal.        Cognition and Memory: Memory is impaired.        Judgment: Judgment normal. Judgment is not inappropriate.     Comments: Her daughter answers most questions         Assessment And Plan:  Mixed hyperlipidemia Assessment & Plan: Cholesterol levels are slightly  elevated.  Will recheck today.    Orders: -     Lipid panel -     Atorvastatin  Calcium ; Take 1 tablet (10 mg total) by mouth daily.  Dispense: 90 tablet; Refill: 1 -     CMP14+EGFR  Hypothyroidism, unspecified type Assessment & Plan: Thyroid  levels are stable. Continue current medications and will make changes as necessary pending lab results.   Orders: -     TSH + free T4 -     Levothyroxine  Sodium; Take 1 tablet (75 mcg total) by mouth daily before  breakfast.  Dispense: 90 tablet; Refill: 1  Hypertensive heart disease without heart failure Assessment & Plan: Blood pressure at 140/90 mmHg. Medication adherence not confirmed. Dietary intake discussed. - Recheck blood pressure before leaving the clinic. - Continue current antihypertensive medications.  Orders: -     Carvedilol ; Take 1 tablet (6.25 mg total) by mouth 2 (two) times daily.  Dispense: 180 tablet; Refill: 1 -     hydroCHLOROthiazide ; Take 1 tablet (25 mg total) by mouth daily.  Dispense: 90 tablet; Refill: 3 -     Valsartan ; TAKE 1 TABLET(320 MG) BY MOUTH DAILY  Dispense: 90 tablet; Refill: 1  COVID-19 vaccination declined  Decreased estrogen level -     DG Bone Density; Future  Overweight with body mass index (BMI) 25.0-29.9  Other long term (current) drug therapy -     TSH + free T4  Moderate late onset Alzheimer's dementia without behavioral disturbance, psychotic disturbance, mood disturbance, or anxiety (HCC) Assessment & Plan: Moderate stage Alzheimer's disease without behavioral disturbance. Emphasized need for supervision and secure environment during travel to prevent disorientation. - Ensure supervision during travel to prevent wandering and confusion. - Discuss travel plans with the friend to ensure understanding of her condition. - Consider using tracking devices for safety during travel.   Essential hypertension Assessment & Plan: Blood pressure at 140/90 mmHg. Medication adherence not confirmed.  Dietary intake discussed. - Recheck blood pressure before leaving the clinic. - Continue current antihypertensive medications.   Hypertensive heart disease without heart failure Assessment & Plan: Blood pressure at 140/90 mmHg. Medication adherence not confirmed. Dietary intake discussed. - Recheck blood pressure before leaving the clinic. - Continue current antihypertensive medications.  Orders: -     Carvedilol ; Take 1 tablet (6.25 mg total) by mouth 2 (two) times daily.  Dispense: 180 tablet; Refill: 1 -     hydroCHLOROthiazide ; Take 1 tablet (25 mg total) by mouth daily.  Dispense: 90 tablet; Refill: 3 -     Valsartan ; TAKE 1 TABLET(320 MG) BY MOUTH DAILY  Dispense: 90 tablet; Refill: 1  Other orders -     Fluticasone  Propionate; SHAKE LIQUID AND USE 2 SPRAYS IN EACH NOSTRIL DAILY  Dispense: 48 g; Refill: 2     Return for 6 month bp check and HM.  Patient was given opportunity to ask questions. Patient verbalized understanding of the plan and was able to repeat key elements of the plan. All questions were answered to their satisfaction.    LILLETTE Gaines Ada, FNP, have reviewed all documentation for this visit. The documentation on 04/29/24 for the exam, diagnosis, procedures, and orders are all accurate and complete.   IF YOU HAVE BEEN REFERRED TO A SPECIALIST, IT MAY TAKE 1-2 WEEKS TO SCHEDULE/PROCESS THE REFERRAL. IF YOU HAVE NOT HEARD FROM US /SPECIALIST IN TWO WEEKS, PLEASE GIVE US  A CALL AT [phone].

## 2024-04-30 LAB — LIPID PANEL
Chol/HDL Ratio: 3.4 ratio (ref 0.0–4.4)
Cholesterol, Total: 196 mg/dL (ref 100–199)
HDL: 58 mg/dL (ref >39)
LDL Chol Calc (NIH): 124 mg/dL — ABNORMAL HIGH (ref 0–99)
Triglycerides: 78 mg/dL (ref 0–149)
VLDL Cholesterol Cal: 14 mg/dL (ref 5–40)

## 2024-04-30 LAB — CMP14+EGFR
ALT: 17 IU/L (ref 0–32)
AST: 21 IU/L (ref 0–40)
Albumin: 4.1 g/dL (ref 3.7–4.7)
Alkaline Phosphatase: 80 IU/L (ref 44–121)
BUN/Creatinine Ratio: 15 (ref 12–28)
BUN: 16 mg/dL (ref 8–27)
Bilirubin Total: 1 mg/dL (ref 0.0–1.2)
CO2: 24 mmol/L (ref 20–29)
Calcium: 10 mg/dL (ref 8.7–10.3)
Chloride: 104 mmol/L (ref 96–106)
Creatinine, Ser: 1.1 mg/dL — ABNORMAL HIGH (ref 0.57–1.00)
Globulin, Total: 2.6 g/dL (ref 1.5–4.5)
Glucose: 101 mg/dL — ABNORMAL HIGH (ref 70–99)
Potassium: 3.6 mmol/L (ref 3.5–5.2)
Sodium: 142 mmol/L (ref 134–144)
Total Protein: 6.7 g/dL (ref 6.0–8.5)
eGFR: 50 mL/min/1.73 — ABNORMAL LOW (ref >59)

## 2024-04-30 LAB — TSH+FREE T4
Free T4: 1.63 ng/dL (ref 0.82–1.77)
TSH: 2.25 u[IU]/mL (ref 0.450–4.500)

## 2024-05-11 ENCOUNTER — Ambulatory Visit: Payer: Self-pay | Admitting: Nurse Practitioner

## 2024-05-11 NOTE — Assessment & Plan Note (Signed)
 Blood pressure at 140/90 mmHg. Medication adherence not confirmed. Dietary intake discussed. - Recheck blood pressure before leaving the clinic. - Continue current antihypertensive medications.

## 2024-05-11 NOTE — Assessment & Plan Note (Signed)
 Thyroid levels are stable. Continue current medications and will make changes as necessary pending lab results.

## 2024-05-11 NOTE — Assessment & Plan Note (Signed)
 Cholesterol levels are slightly elevated.  Will recheck today.

## 2024-05-11 NOTE — Assessment & Plan Note (Signed)
 Moderate stage Alzheimer's disease without behavioral disturbance. Emphasized need for supervision and secure environment during travel to prevent disorientation. - Ensure supervision during travel to prevent wandering and confusion. - Discuss travel plans with the friend to ensure understanding of her condition. - Consider using tracking devices for safety during travel.

## 2024-05-21 ENCOUNTER — Ambulatory Visit: Payer: No Typology Code available for payment source

## 2024-05-21 VITALS — BP 124/82 | HR 83 | Temp 98.0°F | Ht 66.0 in | Wt 164.2 lb

## 2024-05-21 DIAGNOSIS — Z0000 Encounter for general adult medical examination without abnormal findings: Secondary | ICD-10-CM

## 2024-05-21 NOTE — Progress Notes (Signed)
 Subjective:   Janet Barber is a 83 y.o. who presents for a Medicare Wellness preventive visit.  As a reminder, Annual Wellness Visits don't include a physical exam, and some assessments may be limited, especially if this visit is performed virtually. We may recommend an in-person follow-up visit with your provider if needed.  Visit Complete: In person    Persons Participating in Visit: Patient assisted by daughter.  AWV Questionnaire: No: Patient Medicare AWV questionnaire was not completed prior to this visit.  Cardiac Risk Factors include: advanced age (>26men, >64 women);hypertension     Objective:    Today's Vitals   05/21/24 1026 05/21/24 1042  BP: (!) 142/84 124/82  Pulse: 83   Temp: 98 F (36.7 C)   TempSrc: Oral   SpO2: 95%   Weight: 164 lb 3.2 oz (74.5 kg)   Height: 5' 6 (1.676 m)    Body mass index is 26.5 kg/m.     05/21/2024   10:33 AM 05/17/2023   10:23 AM 03/23/2022    3:05 PM 03/10/2021    3:32 PM 01/22/2020    2:41 PM 06/19/2019    8:51 AM 08/13/2018   12:01 PM  Advanced Directives  Does Patient Have a Medical Advance Directive? Yes No No No No No No   Type of Estate agent of [place];Living will        Copy of Healthcare Power of Attorney in Chart? Yes - validated most recent copy scanned in chart (See row information)        Would patient like information on creating a medical advance directive?  No - Patient declined   No - Patient declined Yes (MAU/Ambulatory/Procedural Areas - Information given) No - Patient declined      Data saved with a previous flowsheet row definition    Current Medications (verified) Outpatient Encounter Medications as of 05/21/2024  Medication Sig   aspirin EC 81 MG tablet Take 81 mg by mouth daily. Swallow whole.   atorvastatin  (LIPITOR) 10 MG tablet Take 1 tablet (10 mg total) by mouth daily.   carvedilol  (COREG ) 6.25 MG tablet Take 1 tablet (6.25 mg total) by mouth 2 (two) times daily.    fluticasone  (FLONASE ) 50 MCG/ACT nasal spray SHAKE LIQUID AND USE 2 SPRAYS IN EACH NOSTRIL DAILY (Patient taking differently: SHAKE LIQUID AND USE 2 SPRAYS IN EACH NOSTRIL DAILY, as needed)   hydrochlorothiazide  (HYDRODIURIL ) 25 MG tablet Take 1 tablet (25 mg total) by mouth daily.   levothyroxine  (SYNTHROID ) 75 MCG tablet Take 1 tablet (75 mcg total) by mouth daily before breakfast.   memantine  (NAMENDA ) 10 MG tablet Take 1 tablet (10 mg total) by mouth 2 (two) times daily.   valsartan  (DIOVAN ) 320 MG tablet TAKE 1 TABLET(320 MG) BY MOUTH DAILY   No facility-administered encounter medications on file as of 05/21/2024.    Allergies (verified) Other, Shellfish allergy, and Penicillins   History: Past Medical History:  Diagnosis Date   Allergic rhinitis    Anxiety    Colon cancer (HCC)    Diverticulosis of colon    History of hemorrhoids    Hx of colonic polyps    Hypercholesterolemia    Hypertension    Hypothyroidism    Lumbar back pain    Mitral valve prolapse    Overweight(278.02)    Venous insufficiency    Vitamin D  deficiency    Past Surgical History:  Procedure Laterality Date   ABDOMINAL HYSTERECTOMY  1974   benign breast biopsy  2006   BREAST REDUCTION SURGERY Bilateral 04/02/2018   Procedure: MAMMARY REDUCTION  (BREAST);  Surgeon: Arelia Filippo, MD;  Location: [facility];  Service: Plastics;  Laterality: Bilateral;   MASS EXCISION Left 04/02/2018   Procedure: EXCISION LEFT BREAST MASS;  Surgeon: Arelia Filippo, MD;  Location: [facility];  Service: Plastics;  Laterality: Left;   UMBILICAL HERNIA REPAIR  1988   Family History  Problem Relation Age of Onset   Hypertension Maternal Grandmother    Social History   Socioeconomic History   Marital status: Widowed    Spouse name: Not on file   Number of children: 2   Years of education: Not on file   Highest education level: Not on file  Occupational History   Occupation: retired   Tobacco Use   Smoking status: Former    Current packs/day: 0.00    Types: Cigarettes    Quit date: 09/18/1986    Years since quitting: 37.6   Smokeless tobacco: Never  Vaping Use   Vaping status: Never Used  Substance and Sexual Activity   Alcohol use: No   Drug use: No   Sexual activity: Not Currently  Other Topics Concern   Not on file  Social History Narrative   Not on file   Social Drivers of Health   Financial Resource Strain: Low Risk  (05/21/2024)   Overall Financial Resource Strain (CARDIA)    Difficulty of Paying Living Expenses: Not hard at all  Food Insecurity: No Food Insecurity (05/21/2024)   Hunger Vital Sign    Worried About Running Out of Food in the Last Year: Never true    Ran Out of Food in the Last Year: Never true  Transportation Needs: No Transportation Needs (05/21/2024)   PRAPARE - Administrator, Civil Service (Medical): No    Lack of Transportation (Non-Medical): No  Physical Activity: Inactive (05/21/2024)   Exercise Vital Sign    Days of Exercise per Week: 0 days    Minutes of Exercise per Session: 0 min  Stress: No Stress Concern Present (05/21/2024)   [employer] of Occupational Health - Occupational Stress Questionnaire    Feeling of Stress: Not at all  Social Connections: Socially Isolated (05/21/2024)   Social Connection and Isolation Panel    Frequency of Communication with Friends and Family: Once a week    Frequency of Social Gatherings with Friends and Family: Once a week    Attends Religious Services: More than 4 times per year    Active Member of [employer] or Organizations: No    Attends Banker Meetings: Never    Marital Status: Widowed    Tobacco Counseling Counseling given: Not Answered    Clinical Intake:  Pre-visit preparation completed: Yes  Pain : No/denies pain     Nutritional Status: BMI 25 -29 Overweight Nutritional Risks: None Diabetes: No  No results found for: HGBA1C   How often do  you need to have someone help you when you read instructions, pamphlets, or other written materials from your doctor or pharmacy?: 1 - Never  Interpreter Needed?: No  Information entered by :: NAllen LPN   Activities of Daily Living     05/21/2024   10:28 AM  In your present state of health, do you have any difficulty performing the following activities:  Hearing? 0  Vision? 0  Difficulty concentrating or making decisions? 1  Walking or climbing stairs? 0  Dressing or bathing? 0  Doing  errands, shopping? 1  Preparing Food and eating ? Y  Using the Toilet? N  In the past six months, have you accidently leaked urine? N  Do you have problems with loss of bowel control? N  Managing your Medications? Y  Managing your Finances? Y  Housekeeping or managing your Housekeeping? N    Patient Care Team: Georgina Speaks, FNP as PCP - General (General Practice) Rudy Dorothyann DASEN, RPH-CPP (Pharmacist)  I have updated your Care Teams any recent Medical Services you may have received from other providers in the past year.     Assessment:   This is a routine wellness examination for Janet Barber.  Hearing/Vision screen Hearing Screening - Comments:: Denies hearing issues Vision Screening - Comments:: No regular eye exams,   Goals Addressed             This Visit's Progress    Patient Stated       05/21/2024, maintain eating healthy       Depression Screen     05/21/2024   10:34 AM 05/17/2023   10:25 AM 04/23/2023    9:39 AM 10/05/2022   11:58 AM 03/29/2022   10:28 AM 03/23/2022    3:05 PM 03/10/2021    3:34 PM  PHQ 2/9 Scores  PHQ - 2 Score 0 0 0 0 0 0 0  PHQ- 9 Score 3 2 0        Fall Risk     05/21/2024   10:34 AM 05/17/2023   10:24 AM 04/23/2023    9:39 AM 10/05/2022   11:57 AM 08/01/2022    9:45 AM  Fall Risk   Falls in the past year? 0 0 0 0 0  Number falls in past yr: 0 0 0 0 0  Injury with Fall? 0 0 0 0 0  Risk for fall due to : Medication side effect Medication side effect  No Fall Risks No Fall Risks No Fall Risks  Follow up Falls evaluation completed;Falls prevention discussed Falls prevention discussed;Falls evaluation completed Falls evaluation completed Falls evaluation completed  Falls evaluation completed      Data saved with a previous flowsheet row definition    MEDICARE RISK AT HOME:  Medicare Risk at Home Any stairs in or around the home?: Yes If so, are there any without handrails?: No Home free of loose throw rugs in walkways, pet beds, electrical cords, etc?: Yes Adequate lighting in your home to reduce risk of falls?: Yes Life alert?: No Use of a cane, walker or w/c?: No Grab bars in the bathroom?: No Shower chair or bench in shower?: No Elevated toilet seat or a handicapped toilet?: No  TIMED UP AND GO:  Was the test performed?  No  Cognitive Function: Impaired: Patient has current diagnosis of cognitive impairment.      07/03/2023   10:38 AM  MMSE - Mini Mental State Exam  Orientation to time 2  Orientation to Place 3  Registration 3  Attention/ Calculation 1  Recall 0  Language- name 2 objects 2  Language- repeat 0  Language- follow 3 step command 3  Language- read & follow direction 1  Write a sentence 1  Copy design 0  Total score 16        04/23/2023    9:40 AM 03/23/2022    3:07 PM 03/10/2021    3:37 PM 01/22/2020    2:43 PM 06/19/2019    8:54 AM  6CIT Screen  What Year? 0 points 0 points  0 points 0 points 0 points  What month? 0 points 0 points 0 points 0 points 0 points  What time? 3 points 0 points 0 points 0 points 0 points  Count back from 20 0 points 0 points 0 points 0 points 0 points  Months in reverse 4 points 0 points 0 points 0 points 0 points  Repeat phrase 10 points 0 points 10 points 2 points 0 points  Total Score 17 points 0 points 10 points 2 points 0 points    Immunizations Immunization History  Administered Date(s) Administered   Fluad Quad(high Dose 65+) 09/01/2020, 10/05/2022, 05/16/2024    Influenza Whole 06/03/2008, 07/08/2009   Influenza-Unspecified 07/18/2011, 06/18/2018, 06/16/2019, 05/19/2023   Moderna Sars-Covid-2 Vaccination 10/14/2019, 11/11/2019   PFIZER(Purple Top)SARS-COV-2 Vaccination 08/02/2020, 01/04/2021   Pfizer Covid-19 Vaccine Bivalent Booster 62yrs & up 06/17/2021   Pneumococcal Conjugate-13 06/21/2019   Pneumococcal Polysaccharide-23 06/09/2009   Pneumococcal-Unspecified 06/21/2019   Respiratory Syncytial Virus Vaccine,Recomb Aduvanted(Arexvy) 05/19/2023   Tdap 05/06/2013, 05/19/2023   Zoster Recombinant(Shingrix) 06/21/2019, 06/21/2019, 08/29/2019    Screening Tests Health Maintenance  Topic Date Due   DEXA SCAN  Never done   COVID-19 Vaccine (6 - 2025-26 season) 05/19/2024   Medicare Annual Wellness (AWV)  05/21/2025   DTaP/Tdap/Td (3 - Td or Tdap) 05/18/2033   Pneumococcal Vaccine: 50+ Years  Completed   INFLUENZA VACCINE  Completed   Zoster Vaccines- Shingrix  Completed   HPV VACCINES  Aged Out   Meningococcal B Vaccine  Aged Out   Hepatitis C Screening  Discontinued    Health Maintenance  Health Maintenance Due  Topic Date Due   DEXA SCAN  Never done   COVID-19 Vaccine (6 - 2025-26 season) 05/19/2024   Health Maintenance Items Addressed: Daughter will follow up on bone density. Will get covid vaccine next month  Additional Screening:  Vision Screening: Recommended annual ophthalmology exams for early detection of glaucoma and other disorders of the eye. Would you like a referral to an eye doctor? No    Dental Screening: Recommended annual dental exams for proper oral hygiene  Community Resource Referral / Chronic Care Management: CRR required this visit?  No   CCM required this visit?  No   Plan:    I have personally reviewed and noted the following in the patient's chart:   Medical and social history Use of alcohol, tobacco or illicit drugs  Current medications and supplements including opioid prescriptions. Patient is  not currently taking opioid prescriptions. Functional ability and status Nutritional status Physical activity Advanced directives List of other physicians Hospitalizations, surgeries, and ER visits in previous 12 months Vitals Screenings to include cognitive, depression, and falls Referrals and appointments  In addition, I have reviewed and discussed with patient certain preventive protocols, quality metrics, and best practice recommendations. A written personalized care plan for preventive services as well as general preventive health recommendations were provided to patient.   Ardella FORBES Dawn, LPN   [id number]   After Visit Summary: (In Person-Printed) AVS printed and given to the patient  Notes: Nothing significant to report at this time.

## 2024-05-21 NOTE — Patient Instructions (Addendum)
 Ms. Breeding , Thank you for taking time out of your busy schedule to complete your Annual Wellness Visit with me. I enjoyed our conversation and look forward to speaking with you again next year. I, as well as your care team,  appreciate your ongoing commitment to your health goals. Please review the following plan we discussed and let me know if I can assist you in the future. Your Game plan/ To Do List    Referrals: If you haven't heard from the office you've been referred to, please reach out to them at the phone provided.   Follow up Visits: We will see or speak with you next year for your Next Medicare AWV with our clinical staff Have you seen your provider in the last 6 months (3 months if uncontrolled diabetes)? Yes  Clinician Recommendations:  Aim for 30 minutes of exercise or brisk walking, 6-8 glasses of water, and 5 servings of fruits and vegetables each day.       This is a list of the screenings recommended for you:  Health Maintenance  Topic Date Due   DEXA scan (bone density measurement)  Never done   COVID-19 Vaccine (6 - 2025-26 season) 05/19/2024   Medicare Annual Wellness Visit  05/21/2025   DTaP/Tdap/Td vaccine (3 - Td or Tdap) 05/18/2033   Pneumococcal Vaccine for age over 7  Completed   Flu Shot  Completed   Zoster (Shingles) Vaccine  Completed   HPV Vaccine  Aged Out   Meningitis B Vaccine  Aged Out   Hepatitis C Screening  Discontinued    Advanced directives: (In Chart) A copy of your advanced directives are scanned into your chart should your provider ever need it. Advance Care Planning is important because it:  [x]  Makes sure you receive the medical care that is consistent with your values, goals, and preferences  [x]  It provides guidance to your family and loved ones and reduces their decisional burden about whether or not they are making the right decisions based on your wishes.  Follow the link provided in your after visit summary or read over the  paperwork we have mailed to you to help you started getting your Advance Directives in place. If you need assistance in completing these, please reach out to us  so that we can help you!  See attachments for Preventive Care and Fall Prevention Tips. 7

## 2024-06-10 ENCOUNTER — Telehealth: Payer: Self-pay

## 2024-06-10 NOTE — Telephone Encounter (Signed)
 Copied from CRM #[id number]. 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 Please make corrections and fax back using the claims#[id number]

## 2024-06-21 ENCOUNTER — Other Ambulatory Visit: Payer: Self-pay | Admitting: Neurology

## 2024-07-18 ENCOUNTER — Other Ambulatory Visit: Payer: Self-pay

## 2024-07-18 DIAGNOSIS — I119 Hypertensive heart disease without heart failure: Secondary | ICD-10-CM

## 2024-07-18 MED ORDER — CARVEDILOL 6.25 MG PO TABS: 6.2500 mg | ORAL_TABLET | Freq: Two times a day (BID) | ORAL | 1 refills | Status: AC

## 2024-07-20 ENCOUNTER — Other Ambulatory Visit: Payer: Self-pay | Admitting: Neurology

## 2024-07-21 NOTE — Telephone Encounter (Signed)
 Last seen on 07/03/23 told to follow up as needed. No follow up scheduled   Refills can be sent to PCP

## 2024-07-30 ENCOUNTER — Other Ambulatory Visit: Payer: Self-pay | Admitting: Nurse Practitioner

## 2024-07-30 NOTE — Telephone Encounter (Unsigned)
 Copied from CRM [id number]. Topic: Clinical - Medication Refill >> Jul 30, 2024  1:02 PM Santiya F wrote: Medication: memantine  (NAMENDA ) 10 MG tablet [[id number]]  Has the patient contacted their pharmacy? Yes  (Agent: If yes, when and what did the pharmacy advise?) contact office   This is the patient's preferred pharmacy:  [facility] [address] [place] - [address] [place] [address] Phone: [phone] Fax: [phone]  Is this the correct pharmacy for this prescription? Yes If no, delete pharmacy and type the correct one.   Has the prescription been filled recently? Yes  Is the patient out of the medication? Yes  Has the patient been seen for an appointment in the last year OR does the patient have an upcoming appointment? Yes  Can we respond through MyChart? Yes  Agent: Please be advised that Rx refills may take up to 3 business days. We ask that you follow-up with your pharmacy.  Patient's daughter is calling in because she needs a new prescription for this medication. Her Neurologist says to have her PCP write a new prescription.

## 2024-07-31 ENCOUNTER — Other Ambulatory Visit: Payer: Self-pay | Admitting: Nurse Practitioner

## 2024-07-31 MED ORDER — MEMANTINE HCL 10 MG PO TABS: 10.0000 mg | ORAL_TABLET | Freq: Two times a day (BID) | ORAL | 1 refills | Status: AC

## 2024-08-04 ENCOUNTER — Ambulatory Visit (HOSPITAL_BASED_OUTPATIENT_CLINIC_OR_DEPARTMENT_OTHER)
Admission: RE | Admit: 2024-08-04 | Discharge: 2024-08-04 | Disposition: A | Discharge status: Home / Self Care | Source: Ambulatory Visit | Attending: Nurse Practitioner | Admitting: Nurse Practitioner

## 2024-08-04 DIAGNOSIS — Z780 Asymptomatic menopausal state: Secondary | ICD-10-CM | POA: Diagnosis not present

## 2024-08-04 DIAGNOSIS — E2839 Other primary ovarian failure: Secondary | ICD-10-CM | POA: Insufficient documentation

## 2024-11-05 ENCOUNTER — Encounter: Payer: Self-pay | Admitting: Nurse Practitioner

## 2025-07-01 ENCOUNTER — Ambulatory Visit: Payer: Self-pay
